# Patient Record
Sex: Female | Born: 1940 | Race: Black or African American | Hispanic: No | Smoking: Never smoker
Health system: Southern US, Community
[De-identification: ages and names within clinical notes are randomized; demographics above are authoritative.]

## PROBLEM LIST (undated history)

## (undated) DIAGNOSIS — M069 Rheumatoid arthritis, unspecified: Secondary | ICD-10-CM

## (undated) DIAGNOSIS — M1990 Unspecified osteoarthritis, unspecified site: Secondary | ICD-10-CM

## (undated) DIAGNOSIS — I10 Essential (primary) hypertension: Secondary | ICD-10-CM

## (undated) DIAGNOSIS — R55 Syncope and collapse: Secondary | ICD-10-CM

## (undated) DIAGNOSIS — E669 Obesity, unspecified: Secondary | ICD-10-CM

## (undated) DIAGNOSIS — I82409 Acute embolism and thrombosis of unspecified deep veins of unspecified lower extremity: Secondary | ICD-10-CM

## (undated) DIAGNOSIS — I2510 Atherosclerotic heart disease of native coronary artery without angina pectoris: Secondary | ICD-10-CM

## (undated) DIAGNOSIS — R12 Heartburn: Secondary | ICD-10-CM

## (undated) DIAGNOSIS — C189 Malignant neoplasm of colon, unspecified: Secondary | ICD-10-CM

## (undated) HISTORY — PX: COLECTOMY: SHX59

## (undated) HISTORY — DX: Malignant neoplasm of colon, unspecified: C18.9

## (undated) HISTORY — DX: Unspecified osteoarthritis, unspecified site: M19.90

## (undated) HISTORY — DX: Acute embolism and thrombosis of unspecified deep veins of unspecified lower extremity: I82.409

## (undated) HISTORY — PX: CHOLECYSTECTOMY: SHX55

## (undated) HISTORY — DX: Rheumatoid arthritis, unspecified: M06.9

## (undated) HISTORY — DX: Heartburn: R12

## (undated) HISTORY — PX: APPENDECTOMY: SHX54

## (undated) HISTORY — PX: OTHER SURGICAL HISTORY: SHX169

## (undated) HISTORY — DX: Syncope and collapse: R55

## (undated) HISTORY — DX: Obesity, unspecified: E66.9

## (undated) HISTORY — PX: ABDOMINAL HYSTERECTOMY: SHX81

## (undated) HISTORY — PX: ROTATOR CUFF REPAIR: SHX139

## (undated) HISTORY — DX: Essential (primary) hypertension: I10

## (undated) HISTORY — DX: Atherosclerotic heart disease of native coronary artery without angina pectoris: I25.10

---

## 1997-09-17 ENCOUNTER — Ambulatory Visit (HOSPITAL_COMMUNITY): Admission: RE | Admit: 1997-09-17 | Discharge: 1997-09-17 | Payer: Self-pay | Admitting: Cardiology

## 2000-03-10 DIAGNOSIS — C189 Malignant neoplasm of colon, unspecified: Secondary | ICD-10-CM

## 2000-03-10 HISTORY — DX: Malignant neoplasm of colon, unspecified: C18.9

## 2000-08-15 ENCOUNTER — Encounter (HOSPITAL_COMMUNITY): Admission: RE | Admit: 2000-08-15 | Discharge: 2000-09-14 | Payer: Self-pay | Admitting: Oncology

## 2000-08-15 ENCOUNTER — Encounter: Admission: RE | Admit: 2000-08-15 | Discharge: 2000-08-15 | Payer: Self-pay | Admitting: Oncology

## 2000-08-17 ENCOUNTER — Encounter: Payer: Self-pay | Admitting: Emergency Medicine

## 2000-08-18 ENCOUNTER — Inpatient Hospital Stay (HOSPITAL_COMMUNITY): Admission: AD | Admit: 2000-08-18 | Discharge: 2000-08-22 | Payer: Self-pay | Admitting: *Deleted

## 2000-09-06 ENCOUNTER — Inpatient Hospital Stay (HOSPITAL_COMMUNITY): Admission: EM | Admit: 2000-09-06 | Discharge: 2000-09-10 | Payer: Self-pay | Admitting: Emergency Medicine

## 2000-09-06 ENCOUNTER — Encounter: Payer: Self-pay | Admitting: Emergency Medicine

## 2000-09-07 ENCOUNTER — Encounter: Payer: Self-pay | Admitting: Family Medicine

## 2000-09-16 ENCOUNTER — Encounter: Admission: RE | Admit: 2000-09-16 | Discharge: 2000-09-16 | Payer: Self-pay | Admitting: Oncology

## 2000-09-16 ENCOUNTER — Encounter (HOSPITAL_COMMUNITY): Admission: RE | Admit: 2000-09-16 | Discharge: 2000-10-16 | Payer: Self-pay | Admitting: Oncology

## 2000-10-18 ENCOUNTER — Encounter (HOSPITAL_COMMUNITY): Admission: RE | Admit: 2000-10-18 | Discharge: 2000-11-08 | Payer: Self-pay | Admitting: Oncology

## 2000-10-18 ENCOUNTER — Encounter: Admission: RE | Admit: 2000-10-18 | Discharge: 2000-10-18 | Payer: Self-pay | Admitting: Oncology

## 2000-10-19 ENCOUNTER — Encounter (HOSPITAL_COMMUNITY): Payer: Self-pay | Admitting: Oncology

## 2000-10-19 ENCOUNTER — Encounter: Payer: Self-pay | Admitting: Family Medicine

## 2000-10-19 ENCOUNTER — Inpatient Hospital Stay (HOSPITAL_COMMUNITY): Admission: AD | Admit: 2000-10-19 | Discharge: 2000-10-27 | Payer: Self-pay | Admitting: Family Medicine

## 2000-10-21 ENCOUNTER — Encounter: Payer: Self-pay | Admitting: Internal Medicine

## 2000-11-08 ENCOUNTER — Encounter (HOSPITAL_COMMUNITY): Admission: RE | Admit: 2000-11-08 | Discharge: 2000-12-08 | Payer: Self-pay | Admitting: Oncology

## 2000-11-08 ENCOUNTER — Encounter: Admission: RE | Admit: 2000-11-08 | Discharge: 2000-11-08 | Payer: Self-pay | Admitting: Oncology

## 2000-12-23 ENCOUNTER — Encounter: Payer: Self-pay | Admitting: Family Medicine

## 2000-12-23 ENCOUNTER — Encounter: Admission: RE | Admit: 2000-12-23 | Discharge: 2000-12-23 | Payer: Self-pay | Admitting: Oncology

## 2000-12-23 ENCOUNTER — Ambulatory Visit (HOSPITAL_COMMUNITY): Admission: RE | Admit: 2000-12-23 | Discharge: 2000-12-23 | Payer: Self-pay | Admitting: Family Medicine

## 2001-02-13 ENCOUNTER — Encounter (HOSPITAL_COMMUNITY): Admission: RE | Admit: 2001-02-13 | Discharge: 2001-03-15 | Payer: Self-pay | Admitting: Oncology

## 2001-02-13 ENCOUNTER — Encounter: Admission: RE | Admit: 2001-02-13 | Discharge: 2001-02-13 | Payer: Self-pay | Admitting: Oncology

## 2001-03-10 ENCOUNTER — Encounter (HOSPITAL_COMMUNITY): Payer: Self-pay | Admitting: Oncology

## 2001-03-20 ENCOUNTER — Encounter: Admission: RE | Admit: 2001-03-20 | Discharge: 2001-03-20 | Payer: Self-pay | Admitting: Oncology

## 2001-03-25 ENCOUNTER — Encounter: Payer: Self-pay | Admitting: Emergency Medicine

## 2001-03-25 ENCOUNTER — Encounter: Payer: Self-pay | Admitting: Family Medicine

## 2001-03-25 ENCOUNTER — Observation Stay (HOSPITAL_COMMUNITY): Admission: EM | Admit: 2001-03-25 | Discharge: 2001-03-27 | Payer: Self-pay | Admitting: Emergency Medicine

## 2001-04-03 ENCOUNTER — Ambulatory Visit (HOSPITAL_COMMUNITY): Admission: RE | Admit: 2001-04-03 | Discharge: 2001-04-03 | Payer: Self-pay | Admitting: Cardiology

## 2001-04-03 ENCOUNTER — Encounter: Payer: Self-pay | Admitting: Cardiology

## 2001-04-27 ENCOUNTER — Encounter (HOSPITAL_COMMUNITY): Admission: RE | Admit: 2001-04-27 | Discharge: 2001-05-27 | Payer: Self-pay | Admitting: Oncology

## 2001-04-27 ENCOUNTER — Encounter: Admission: RE | Admit: 2001-04-27 | Discharge: 2001-04-27 | Payer: Self-pay | Admitting: Oncology

## 2001-05-17 ENCOUNTER — Encounter (HOSPITAL_COMMUNITY): Payer: Self-pay | Admitting: Oncology

## 2001-09-13 ENCOUNTER — Encounter (HOSPITAL_COMMUNITY): Admission: RE | Admit: 2001-09-13 | Discharge: 2001-10-13 | Payer: Self-pay | Admitting: Oncology

## 2001-09-18 ENCOUNTER — Emergency Department (HOSPITAL_COMMUNITY): Admission: EM | Admit: 2001-09-18 | Discharge: 2001-09-19 | Payer: Self-pay | Admitting: Emergency Medicine

## 2001-09-19 ENCOUNTER — Encounter: Payer: Self-pay | Admitting: Emergency Medicine

## 2001-09-27 ENCOUNTER — Ambulatory Visit (HOSPITAL_COMMUNITY): Admission: RE | Admit: 2001-09-27 | Discharge: 2001-09-27 | Payer: Self-pay | Admitting: General Surgery

## 2001-11-03 ENCOUNTER — Ambulatory Visit (HOSPITAL_COMMUNITY): Admission: RE | Admit: 2001-11-03 | Discharge: 2001-11-03 | Payer: Self-pay | Admitting: Orthopedic Surgery

## 2001-11-03 ENCOUNTER — Encounter: Payer: Self-pay | Admitting: Orthopedic Surgery

## 2002-01-17 ENCOUNTER — Encounter: Admission: RE | Admit: 2002-01-17 | Discharge: 2002-01-17 | Payer: Self-pay | Admitting: Oncology

## 2002-01-17 ENCOUNTER — Encounter (HOSPITAL_COMMUNITY): Admission: RE | Admit: 2002-01-17 | Discharge: 2002-02-16 | Payer: Self-pay | Admitting: Oncology

## 2002-05-24 ENCOUNTER — Encounter: Payer: Self-pay | Admitting: Family Medicine

## 2002-05-24 ENCOUNTER — Ambulatory Visit (HOSPITAL_COMMUNITY): Admission: RE | Admit: 2002-05-24 | Discharge: 2002-05-24 | Payer: Self-pay | Admitting: Family Medicine

## 2002-06-19 ENCOUNTER — Encounter: Admission: RE | Admit: 2002-06-19 | Discharge: 2002-06-19 | Payer: Self-pay | Admitting: Oncology

## 2002-06-19 ENCOUNTER — Encounter (HOSPITAL_COMMUNITY): Admission: RE | Admit: 2002-06-19 | Discharge: 2002-07-19 | Payer: Self-pay | Admitting: Oncology

## 2002-08-23 ENCOUNTER — Encounter (HOSPITAL_COMMUNITY): Admission: RE | Admit: 2002-08-23 | Discharge: 2002-09-22 | Payer: Self-pay | Admitting: Rheumatology

## 2002-08-23 ENCOUNTER — Encounter: Payer: Self-pay | Admitting: Rheumatology

## 2002-10-04 ENCOUNTER — Encounter (HOSPITAL_COMMUNITY): Admission: RE | Admit: 2002-10-04 | Discharge: 2002-11-03 | Payer: Self-pay | Admitting: Rheumatology

## 2002-11-04 ENCOUNTER — Emergency Department (HOSPITAL_COMMUNITY): Admission: EM | Admit: 2002-11-04 | Discharge: 2002-11-04 | Payer: Self-pay | Admitting: Emergency Medicine

## 2002-11-29 ENCOUNTER — Encounter (HOSPITAL_COMMUNITY): Admission: RE | Admit: 2002-11-29 | Discharge: 2002-12-29 | Payer: Self-pay | Admitting: Specialist

## 2003-01-08 ENCOUNTER — Encounter (HOSPITAL_COMMUNITY): Admission: RE | Admit: 2003-01-08 | Discharge: 2003-02-07 | Payer: Self-pay | Admitting: Rheumatology

## 2003-01-08 ENCOUNTER — Encounter: Admission: RE | Admit: 2003-01-08 | Discharge: 2003-01-08 | Payer: Self-pay | Admitting: Oncology

## 2003-01-10 ENCOUNTER — Encounter (HOSPITAL_COMMUNITY): Admission: RE | Admit: 2003-01-10 | Discharge: 2003-02-07 | Payer: Self-pay | Admitting: Rheumatology

## 2003-03-29 ENCOUNTER — Encounter: Admission: RE | Admit: 2003-03-29 | Discharge: 2003-03-29 | Payer: Self-pay | Admitting: Oncology

## 2003-04-25 ENCOUNTER — Ambulatory Visit (HOSPITAL_COMMUNITY): Admission: RE | Admit: 2003-04-25 | Discharge: 2003-04-25 | Payer: Self-pay | Admitting: Family Medicine

## 2003-05-30 ENCOUNTER — Ambulatory Visit (HOSPITAL_COMMUNITY): Admission: RE | Admit: 2003-05-30 | Discharge: 2003-05-30 | Payer: Self-pay | Admitting: Family Medicine

## 2003-06-28 ENCOUNTER — Encounter: Admission: RE | Admit: 2003-06-28 | Discharge: 2003-06-28 | Payer: Self-pay | Admitting: Oncology

## 2003-10-02 ENCOUNTER — Emergency Department (HOSPITAL_COMMUNITY): Admission: EM | Admit: 2003-10-02 | Discharge: 2003-10-03 | Payer: Self-pay | Admitting: *Deleted

## 2003-10-10 ENCOUNTER — Encounter (HOSPITAL_COMMUNITY): Admission: RE | Admit: 2003-10-10 | Discharge: 2003-11-09 | Payer: Self-pay | Admitting: Oncology

## 2003-10-10 ENCOUNTER — Encounter: Admission: RE | Admit: 2003-10-10 | Discharge: 2003-10-10 | Payer: Self-pay | Admitting: Cardiology

## 2003-10-10 ENCOUNTER — Encounter: Admission: RE | Admit: 2003-10-10 | Discharge: 2003-10-10 | Payer: Self-pay | Admitting: Oncology

## 2003-12-30 ENCOUNTER — Encounter: Admission: RE | Admit: 2003-12-30 | Discharge: 2003-12-30 | Payer: Self-pay | Admitting: Oncology

## 2003-12-30 ENCOUNTER — Encounter (HOSPITAL_COMMUNITY): Admission: RE | Admit: 2003-12-30 | Discharge: 2004-01-29 | Payer: Self-pay | Admitting: Oncology

## 2004-04-06 ENCOUNTER — Ambulatory Visit (HOSPITAL_COMMUNITY): Payer: Self-pay | Admitting: Oncology

## 2004-04-06 ENCOUNTER — Encounter: Admission: RE | Admit: 2004-04-06 | Discharge: 2004-04-06 | Payer: Self-pay | Admitting: Oncology

## 2004-04-06 ENCOUNTER — Encounter (HOSPITAL_COMMUNITY): Admission: RE | Admit: 2004-04-06 | Discharge: 2004-05-06 | Payer: Self-pay | Admitting: Oncology

## 2004-04-20 ENCOUNTER — Other Ambulatory Visit: Admission: RE | Admit: 2004-04-20 | Discharge: 2004-04-20 | Payer: Self-pay | Admitting: Obstetrics and Gynecology

## 2004-06-03 ENCOUNTER — Ambulatory Visit (HOSPITAL_COMMUNITY): Admission: RE | Admit: 2004-06-03 | Discharge: 2004-06-03 | Payer: Self-pay | Admitting: Family Medicine

## 2004-07-15 ENCOUNTER — Encounter (HOSPITAL_COMMUNITY): Admission: RE | Admit: 2004-07-15 | Discharge: 2004-08-14 | Payer: Self-pay | Admitting: Oncology

## 2004-07-15 ENCOUNTER — Ambulatory Visit (HOSPITAL_COMMUNITY): Payer: Self-pay | Admitting: Oncology

## 2004-07-15 ENCOUNTER — Encounter: Admission: RE | Admit: 2004-07-15 | Discharge: 2004-07-15 | Payer: Self-pay | Admitting: Oncology

## 2004-08-26 ENCOUNTER — Encounter: Admission: RE | Admit: 2004-08-26 | Discharge: 2004-08-26 | Payer: Self-pay | Admitting: Oncology

## 2004-08-26 ENCOUNTER — Encounter (HOSPITAL_COMMUNITY): Admission: RE | Admit: 2004-08-26 | Discharge: 2004-09-25 | Payer: Self-pay | Admitting: Oncology

## 2004-10-13 ENCOUNTER — Ambulatory Visit (HOSPITAL_COMMUNITY): Admission: RE | Admit: 2004-10-13 | Discharge: 2004-10-13 | Payer: Self-pay | Admitting: Family Medicine

## 2004-10-15 ENCOUNTER — Ambulatory Visit (HOSPITAL_COMMUNITY): Admission: RE | Admit: 2004-10-15 | Discharge: 2004-10-15 | Payer: Self-pay | Admitting: General Surgery

## 2004-11-18 ENCOUNTER — Encounter (HOSPITAL_COMMUNITY): Admission: RE | Admit: 2004-11-18 | Discharge: 2004-12-18 | Payer: Self-pay | Admitting: Oncology

## 2004-11-18 ENCOUNTER — Ambulatory Visit (HOSPITAL_COMMUNITY): Payer: Self-pay | Admitting: Oncology

## 2004-11-18 ENCOUNTER — Encounter: Admission: RE | Admit: 2004-11-18 | Discharge: 2004-11-18 | Payer: Self-pay | Admitting: Oncology

## 2004-12-31 ENCOUNTER — Encounter: Admission: RE | Admit: 2004-12-31 | Discharge: 2004-12-31 | Payer: Self-pay | Admitting: Oncology

## 2004-12-31 ENCOUNTER — Encounter (HOSPITAL_COMMUNITY): Admission: RE | Admit: 2004-12-31 | Discharge: 2005-01-30 | Payer: Self-pay | Admitting: Oncology

## 2005-01-15 ENCOUNTER — Ambulatory Visit (HOSPITAL_COMMUNITY): Payer: Self-pay | Admitting: Oncology

## 2005-04-14 ENCOUNTER — Ambulatory Visit (HOSPITAL_COMMUNITY): Payer: Self-pay | Admitting: Oncology

## 2005-04-14 ENCOUNTER — Encounter (HOSPITAL_COMMUNITY): Admission: RE | Admit: 2005-04-14 | Discharge: 2005-04-14 | Payer: Self-pay | Admitting: Oncology

## 2005-04-14 ENCOUNTER — Encounter: Admission: RE | Admit: 2005-04-14 | Discharge: 2005-04-14 | Payer: Self-pay | Admitting: Oncology

## 2005-07-12 ENCOUNTER — Encounter (HOSPITAL_COMMUNITY): Admission: RE | Admit: 2005-07-12 | Discharge: 2005-08-11 | Payer: Self-pay | Admitting: Oncology

## 2005-07-12 ENCOUNTER — Ambulatory Visit (HOSPITAL_COMMUNITY): Payer: Self-pay | Admitting: Oncology

## 2005-07-12 ENCOUNTER — Encounter: Admission: RE | Admit: 2005-07-12 | Discharge: 2005-07-12 | Payer: Self-pay | Admitting: Oncology

## 2005-07-28 ENCOUNTER — Ambulatory Visit (HOSPITAL_COMMUNITY): Admission: RE | Admit: 2005-07-28 | Discharge: 2005-07-28 | Payer: Self-pay | Admitting: Family Medicine

## 2005-08-01 ENCOUNTER — Emergency Department (HOSPITAL_COMMUNITY): Admission: EM | Admit: 2005-08-01 | Discharge: 2005-08-01 | Payer: Self-pay | Admitting: Emergency Medicine

## 2005-10-01 ENCOUNTER — Encounter: Admission: RE | Admit: 2005-10-01 | Discharge: 2005-10-01 | Payer: Self-pay | Admitting: Oncology

## 2005-10-01 ENCOUNTER — Encounter (HOSPITAL_COMMUNITY): Admission: RE | Admit: 2005-10-01 | Discharge: 2005-10-31 | Payer: Self-pay | Admitting: Oncology

## 2005-10-01 ENCOUNTER — Ambulatory Visit (HOSPITAL_COMMUNITY): Payer: Self-pay | Admitting: Oncology

## 2005-10-15 ENCOUNTER — Ambulatory Visit (HOSPITAL_COMMUNITY): Admission: RE | Admit: 2005-10-15 | Discharge: 2005-10-15 | Payer: Self-pay | Admitting: Family Medicine

## 2005-11-01 ENCOUNTER — Encounter: Admission: RE | Admit: 2005-11-01 | Discharge: 2005-11-01 | Payer: Self-pay | Admitting: Oncology

## 2005-11-01 ENCOUNTER — Encounter (HOSPITAL_COMMUNITY): Admission: RE | Admit: 2005-11-01 | Discharge: 2005-12-01 | Payer: Self-pay | Admitting: Oncology

## 2006-01-17 ENCOUNTER — Encounter: Admission: RE | Admit: 2006-01-17 | Discharge: 2006-02-04 | Payer: Self-pay | Admitting: Oncology

## 2006-01-17 ENCOUNTER — Ambulatory Visit (HOSPITAL_COMMUNITY): Payer: Self-pay | Admitting: Oncology

## 2006-01-17 ENCOUNTER — Encounter (HOSPITAL_COMMUNITY): Admission: RE | Admit: 2006-01-17 | Discharge: 2006-02-04 | Payer: Self-pay | Admitting: Oncology

## 2006-04-13 ENCOUNTER — Encounter: Admission: RE | Admit: 2006-04-13 | Discharge: 2006-04-13 | Payer: Self-pay

## 2006-04-19 ENCOUNTER — Encounter: Admission: RE | Admit: 2006-04-19 | Discharge: 2006-04-19 | Payer: Self-pay

## 2006-05-06 ENCOUNTER — Encounter: Admission: RE | Admit: 2006-05-06 | Discharge: 2006-05-06 | Payer: Self-pay

## 2006-05-27 ENCOUNTER — Encounter: Admission: RE | Admit: 2006-05-27 | Discharge: 2006-05-27 | Payer: Self-pay

## 2006-07-18 ENCOUNTER — Encounter (HOSPITAL_COMMUNITY): Admission: RE | Admit: 2006-07-18 | Discharge: 2006-08-17 | Payer: Self-pay | Admitting: Oncology

## 2006-07-18 ENCOUNTER — Ambulatory Visit (HOSPITAL_COMMUNITY): Payer: Self-pay | Admitting: Oncology

## 2006-08-16 ENCOUNTER — Ambulatory Visit (HOSPITAL_BASED_OUTPATIENT_CLINIC_OR_DEPARTMENT_OTHER): Admission: RE | Admit: 2006-08-16 | Discharge: 2006-08-16 | Payer: Self-pay | Admitting: Orthopedic Surgery

## 2006-09-22 ENCOUNTER — Observation Stay (HOSPITAL_COMMUNITY): Admission: EM | Admit: 2006-09-22 | Discharge: 2006-09-23 | Payer: Self-pay | Admitting: Emergency Medicine

## 2006-10-25 ENCOUNTER — Ambulatory Visit (HOSPITAL_COMMUNITY): Admission: RE | Admit: 2006-10-25 | Discharge: 2006-10-25 | Payer: Self-pay | Admitting: Family Medicine

## 2006-11-21 ENCOUNTER — Ambulatory Visit (HOSPITAL_COMMUNITY): Payer: Self-pay | Admitting: Oncology

## 2006-11-21 ENCOUNTER — Encounter (HOSPITAL_COMMUNITY): Admission: RE | Admit: 2006-11-21 | Discharge: 2006-12-21 | Payer: Self-pay | Admitting: Oncology

## 2007-01-05 ENCOUNTER — Encounter: Admission: RE | Admit: 2007-01-05 | Discharge: 2007-01-05 | Payer: Self-pay | Admitting: Cardiology

## 2007-01-16 ENCOUNTER — Encounter (HOSPITAL_COMMUNITY): Admission: RE | Admit: 2007-01-16 | Discharge: 2007-02-07 | Payer: Self-pay | Admitting: Oncology

## 2007-01-17 ENCOUNTER — Ambulatory Visit (HOSPITAL_COMMUNITY): Payer: Self-pay | Admitting: Oncology

## 2007-03-29 ENCOUNTER — Encounter: Admission: RE | Admit: 2007-03-29 | Discharge: 2007-03-29 | Payer: Self-pay

## 2007-07-14 ENCOUNTER — Encounter (HOSPITAL_COMMUNITY): Admission: RE | Admit: 2007-07-14 | Discharge: 2007-08-13 | Payer: Self-pay | Admitting: Oncology

## 2007-07-14 ENCOUNTER — Ambulatory Visit (HOSPITAL_COMMUNITY): Payer: Self-pay | Admitting: Oncology

## 2007-08-10 ENCOUNTER — Encounter: Admission: RE | Admit: 2007-08-10 | Discharge: 2007-08-10 | Payer: Self-pay

## 2007-09-13 ENCOUNTER — Encounter (HOSPITAL_COMMUNITY): Admission: RE | Admit: 2007-09-13 | Discharge: 2007-10-13 | Payer: Self-pay | Admitting: Oncology

## 2007-09-15 ENCOUNTER — Ambulatory Visit (HOSPITAL_COMMUNITY): Payer: Self-pay | Admitting: Oncology

## 2007-11-15 ENCOUNTER — Ambulatory Visit (HOSPITAL_COMMUNITY): Admission: RE | Admit: 2007-11-15 | Discharge: 2007-11-15 | Payer: Self-pay | Admitting: Family Medicine

## 2007-12-26 ENCOUNTER — Emergency Department (HOSPITAL_COMMUNITY): Admission: EM | Admit: 2007-12-26 | Discharge: 2007-12-27 | Payer: Self-pay | Admitting: Emergency Medicine

## 2008-01-11 ENCOUNTER — Encounter: Admission: RE | Admit: 2008-01-11 | Discharge: 2008-01-11 | Payer: Self-pay

## 2008-02-05 ENCOUNTER — Encounter (HOSPITAL_COMMUNITY): Admission: RE | Admit: 2008-02-05 | Discharge: 2008-03-06 | Payer: Self-pay | Admitting: Oncology

## 2008-02-12 ENCOUNTER — Ambulatory Visit (HOSPITAL_COMMUNITY): Payer: Self-pay | Admitting: Oncology

## 2008-07-12 ENCOUNTER — Ambulatory Visit (HOSPITAL_COMMUNITY): Payer: Self-pay | Admitting: Oncology

## 2008-07-12 ENCOUNTER — Encounter (HOSPITAL_COMMUNITY): Admission: RE | Admit: 2008-07-12 | Discharge: 2008-08-11 | Payer: Self-pay | Admitting: Oncology

## 2008-08-14 ENCOUNTER — Encounter: Admission: RE | Admit: 2008-08-14 | Discharge: 2008-08-14 | Payer: Self-pay

## 2008-08-19 ENCOUNTER — Encounter: Admission: RE | Admit: 2008-08-19 | Discharge: 2008-08-19 | Payer: Self-pay | Admitting: Neurosurgery

## 2008-11-13 ENCOUNTER — Encounter: Admission: RE | Admit: 2008-11-13 | Discharge: 2008-11-13 | Payer: Self-pay

## 2009-01-09 ENCOUNTER — Encounter: Admission: RE | Admit: 2009-01-09 | Discharge: 2009-01-09 | Payer: Self-pay | Admitting: Neurosurgery

## 2009-01-21 ENCOUNTER — Encounter (HOSPITAL_COMMUNITY): Admission: RE | Admit: 2009-01-21 | Discharge: 2009-02-05 | Payer: Self-pay | Admitting: Oncology

## 2009-01-21 ENCOUNTER — Ambulatory Visit (HOSPITAL_COMMUNITY): Payer: Self-pay | Admitting: Oncology

## 2009-01-24 ENCOUNTER — Ambulatory Visit (HOSPITAL_COMMUNITY): Admission: RE | Admit: 2009-01-24 | Discharge: 2009-01-24 | Payer: Self-pay | Admitting: Family Medicine

## 2009-06-08 ENCOUNTER — Emergency Department (HOSPITAL_COMMUNITY): Admission: EM | Admit: 2009-06-08 | Discharge: 2009-06-08 | Payer: Self-pay | Admitting: Emergency Medicine

## 2009-07-14 ENCOUNTER — Encounter (HOSPITAL_COMMUNITY): Admission: RE | Admit: 2009-07-14 | Discharge: 2009-08-13 | Payer: Self-pay | Admitting: Oncology

## 2009-07-14 ENCOUNTER — Ambulatory Visit (HOSPITAL_COMMUNITY): Payer: Self-pay | Admitting: Oncology

## 2009-08-27 ENCOUNTER — Encounter: Admission: RE | Admit: 2009-08-27 | Discharge: 2009-08-27 | Payer: Self-pay | Admitting: Internal Medicine

## 2009-09-24 ENCOUNTER — Ambulatory Visit (HOSPITAL_COMMUNITY): Admission: RE | Admit: 2009-09-24 | Discharge: 2009-09-24 | Payer: Self-pay | Admitting: Family Medicine

## 2010-02-02 ENCOUNTER — Ambulatory Visit (HOSPITAL_COMMUNITY)
Admission: RE | Admit: 2010-02-02 | Discharge: 2010-02-02 | Payer: Self-pay | Source: Home / Self Care | Admitting: Family Medicine

## 2010-05-13 ENCOUNTER — Encounter (HOSPITAL_COMMUNITY)
Admission: RE | Admit: 2010-05-13 | Discharge: 2010-06-09 | Payer: Self-pay | Source: Home / Self Care | Attending: Family Medicine | Admitting: Family Medicine

## 2010-05-31 ENCOUNTER — Encounter: Payer: Self-pay | Admitting: *Deleted

## 2010-06-01 ENCOUNTER — Encounter: Payer: Self-pay | Admitting: Family Medicine

## 2010-06-11 ENCOUNTER — Encounter (HOSPITAL_COMMUNITY)
Admission: RE | Admit: 2010-06-11 | Discharge: 2010-06-11 | Disposition: A | Discharge status: Home / Self Care | Payer: BC Managed Care – PPO | Source: Ambulatory Visit | Attending: Family Medicine | Admitting: Family Medicine

## 2010-06-11 DIAGNOSIS — M25549 Pain in joints of unspecified hand: Secondary | ICD-10-CM | POA: Insufficient documentation

## 2010-06-11 DIAGNOSIS — IMO0001 Reserved for inherently not codable concepts without codable children: Secondary | ICD-10-CM | POA: Insufficient documentation

## 2010-06-11 DIAGNOSIS — M19049 Primary osteoarthritis, unspecified hand: Secondary | ICD-10-CM | POA: Insufficient documentation

## 2010-07-21 ENCOUNTER — Ambulatory Visit (HOSPITAL_COMMUNITY): Payer: BC Managed Care – PPO | Admitting: Oncology

## 2010-07-21 ENCOUNTER — Encounter (HOSPITAL_COMMUNITY): Payer: Medicare Other | Attending: Oncology

## 2010-07-21 DIAGNOSIS — M25549 Pain in joints of unspecified hand: Secondary | ICD-10-CM | POA: Insufficient documentation

## 2010-07-21 DIAGNOSIS — C189 Malignant neoplasm of colon, unspecified: Secondary | ICD-10-CM

## 2010-07-21 DIAGNOSIS — IMO0001 Reserved for inherently not codable concepts without codable children: Secondary | ICD-10-CM | POA: Insufficient documentation

## 2010-07-21 DIAGNOSIS — M19049 Primary osteoarthritis, unspecified hand: Secondary | ICD-10-CM | POA: Insufficient documentation

## 2010-07-21 LAB — COMPREHENSIVE METABOLIC PANEL
Albumin: 3.6 g/dL (ref 3.5–5.2)
Alkaline Phosphatase: 82 U/L (ref 39–117)
BUN: 14 mg/dL (ref 6–23)
Calcium: 9.6 mg/dL (ref 8.4–10.5)
Creatinine, Ser: 0.79 mg/dL (ref 0.4–1.2)
GFR calc non Af Amer: >60 mL/min (ref >60)
Total Bilirubin: 1.2 mg/dL (ref 0.3–1.2)
Total Protein: 7.2 g/dL (ref 6.0–8.3)

## 2010-07-21 LAB — CBC
MCH: 26.8 pg (ref 26.0–34.0)
MCHC: 34.2 g/dL (ref 30.0–36.0)
MCV: 78.4 fL (ref 78.0–100.0)
Platelets: 261 10*3/uL (ref 150–400)
RDW: 14.2 % (ref 11.5–15.5)
WBC: 15.6 10*3/uL — ABNORMAL HIGH (ref 4.0–10.5)

## 2010-07-21 LAB — DIFFERENTIAL
Eosinophils Absolute: 0.1 10*3/uL (ref 0.0–0.7)
Eosinophils Relative: 1 % (ref 0–5)
Lymphs Abs: 1.7 10*3/uL (ref 0.7–4.0)

## 2010-07-22 LAB — CEA: CEA: 1 ng/mL (ref 0.0–5.0)

## 2010-07-31 LAB — DIFFERENTIAL
Eosinophils Relative: 0 % (ref 0–5)
Lymphocytes Relative: 10 % — ABNORMAL LOW (ref 12–46)
Lymphs Abs: 0.9 10*3/uL (ref 0.7–4.0)
Monocytes Relative: 3 % (ref 3–12)

## 2010-07-31 LAB — COMPREHENSIVE METABOLIC PANEL
AST: 15 U/L (ref 0–37)
CO2: 28 mEq/L (ref 19–32)
Calcium: 8.8 mg/dL (ref 8.4–10.5)
Creatinine, Ser: 0.73 mg/dL (ref 0.4–1.2)
GFR calc Af Amer: >60 mL/min (ref >60)
GFR calc non Af Amer: >60 mL/min (ref >60)
Total Protein: 6.5 g/dL (ref 6.0–8.3)

## 2010-07-31 LAB — CBC
Hemoglobin: 11.9 g/dL — ABNORMAL LOW (ref 12.0–15.0)
MCHC: 34.7 g/dL (ref 30.0–36.0)
Platelets: 241 10*3/uL (ref 150–400)
RDW: 14.6 % (ref 11.5–15.5)

## 2010-07-31 LAB — CEA: CEA: 0.8 ng/mL (ref 0.0–5.0)

## 2010-08-14 LAB — CEA: CEA: 1.1 ng/mL (ref 0.0–5.0)

## 2010-08-20 LAB — COMPREHENSIVE METABOLIC PANEL
ALT: 28 U/L (ref 0–35)
Alkaline Phosphatase: 96 U/L (ref 39–117)
CO2: 26 mEq/L (ref 19–32)
GFR calc non Af Amer: >60 mL/min (ref >60)
Glucose, Bld: 114 mg/dL — ABNORMAL HIGH (ref 70–99)
Potassium: 3.5 mEq/L (ref 3.5–5.1)
Sodium: 139 mEq/L (ref 135–145)

## 2010-08-20 LAB — CBC
HCT: 36.4 % (ref 36.0–46.0)
Hemoglobin: 12.3 g/dL (ref 12.0–15.0)
RDW: 13.9 % (ref 11.5–15.5)
WBC: 8.1 10*3/uL (ref 4.0–10.5)

## 2010-09-15 ENCOUNTER — Encounter (HOSPITAL_COMMUNITY): Payer: Medicare Other

## 2010-09-15 ENCOUNTER — Other Ambulatory Visit (HOSPITAL_COMMUNITY): Payer: Self-pay | Admitting: Oncology

## 2010-09-15 ENCOUNTER — Encounter (HOSPITAL_COMMUNITY): Payer: Medicare Other | Attending: Oncology

## 2010-09-15 DIAGNOSIS — Z09 Encounter for follow-up examination after completed treatment for conditions other than malignant neoplasm: Secondary | ICD-10-CM | POA: Insufficient documentation

## 2010-09-15 DIAGNOSIS — C189 Malignant neoplasm of colon, unspecified: Secondary | ICD-10-CM

## 2010-09-15 DIAGNOSIS — E119 Type 2 diabetes mellitus without complications: Secondary | ICD-10-CM | POA: Insufficient documentation

## 2010-09-15 DIAGNOSIS — Z85038 Personal history of other malignant neoplasm of large intestine: Secondary | ICD-10-CM | POA: Insufficient documentation

## 2010-09-15 LAB — DIFFERENTIAL
Basophils Absolute: 0.1 10*3/uL (ref 0.0–0.1)
Basophils Relative: 1 % (ref 0–1)
Eosinophils Absolute: 0.2 10*3/uL (ref 0.0–0.7)
Neutrophils Relative %: 73 % (ref 43–77)

## 2010-09-15 LAB — POTASSIUM: Potassium: 3.9 mEq/L (ref 3.5–5.1)

## 2010-09-15 LAB — CBC
MCH: 26.8 pg (ref 26.0–34.0)
Platelets: 234 10*3/uL (ref 150–400)
RBC: 4.81 MIL/uL (ref 3.87–5.11)
WBC: 9.2 10*3/uL (ref 4.0–10.5)

## 2010-09-22 NOTE — H&P (Signed)
Melissa Reynolds, PAGEL NO.:  [id number]   MEDICAL RECORD NO.:  [id number]          PATIENT TYPE:  INP   LOCATION:  A302                          FACILITY:  [facility]   PHYSICIAN:  Osvaldo Shipper, MD     DATE OF BIRTH:  08/17/1940   DATE OF ADMISSION:  09/22/2006  DATE OF DISCHARGE:  LH                              HISTORY & PHYSICAL   PRIMARY CARE PHYSICIAN:  Annia Friendly. Loleta Chance, M.D.  She is also followed by  Ladona Horns. Mariel Sleet, M.D.   ADMISSION DIAGNOSES:  1. Right acute deep venous thrombosis in the right lower extremity.  2. History of type 2 diabetes.  3. History of hypertension.  4. History of chemotherapy-induced cardiomyopathy.  5. History of colon cancer in the past.  6. History of rheumatoid arthritis.   CHIEF COMPLAINT:  Right-sided leg pain since yesterday.   HISTORY OF PRESENT ILLNESS:  The patient is a 70 year old African-  American female who was in her usual state of health until about  yesterday morning when she started noticing pain in her right thigh  region.  The pain was at times 10/10 intensity described as a cramping  type of pain with no radiation.  There were no associated symptoms.  Precipitating factor was not identified.  The pain was activated when  she tried to move her right leg.  Relieving factors included pain  medications.  The patient reported surgery to her right shoulder on  August 16, 2006.  She states that she does not lead a sedentary lifestyle.  No recent travel outside this area.  No air trips recently.  Otherwise,  the patient feels quite well.  She does have aches and pain associated  with her rheumatoid arthritis, but otherwise is quite healthy.   HOME MEDICATIONS:  I am not sure if this is a complete list, but this is  what we have.  1. Methotrexate 2.5 mg tablet five tablets in the morning and five      tablets in the evening on every Sunday.  2. Prednisone 5 mg every other day.  3. Norvasc 5 mg once a day.  4.  Spironolactone 25 mg once a day.  5. Isosorbide mononitrate at 30 mg once a day.  6. Hyzaar 100 x 12.5 once a day.  7. Alprazolam 0.25 mg as needed.  8. Folic acid 1 mg once a day.  9. Darvocet-N as needed.  10.Octopus Met 15/850 mg daily.  11.She was recently on potassium which was discontinued.   ALLERGIES:  SULFA which causes oral ulcers.   PAST MEDICAL HISTORY:  Previous history of DVT in the same leg about 5  years ago when she was receiving chemotherapy for her colon cancer.  History of colon cancer for which she has had a colectomy and  chemotherapy.  Because of the chemotherapy, she developed  cardiomyopathy.  She has type 2 diabetes on oral hypoglycemic agents.  She has hypertension and rheumatoid arthritis.  She has never had an MI.   PAST SURGICAL HISTORY:  Multiple orthopedic surgeries to knees and  shoulders for various injuries and arthritis.  She also has rheumatoid  arthritis.  She has also had hysterectomy, cholecystectomy.  She has had  some ear surgeries.   SOCIAL HISTORY:  She lives in [place] with her sister.  No smoking  use, no alcohol use, no illicit drug use.  Independent with her daily  activities.  She does not need a cane or walker.   FAMILY HISTORY:  Includes significant cancer in the family including  bone cancer, prostate cancer, history of brain aneurysms, strokes,  coronary artery disease, and CHF.   REVIEW OF SYSTEMS:  GENERAL:  Positive for weakness.  HEENT:  Unremarkable.  GASTROINTESTINAL:  Unremarkable.  GENITOURINARY:  Unremarkable.  RESPIRATORY:  Unremarkable.  CARDIOVASCULAR:  Denied any  chest pain or shortness of breath.  MUSCULOSKELETAL:  As in HPI with  pain from her rheumatoid arthritis.  NEUROLOGY:  Unremarkable.  MUSCULOSKELETAL:  See HPI.  Other systems unremarkable.   PHYSICAL EXAMINATION:  VITAL SIGNS:  Temperature 97.4, blood pressure  147/66, heart rate in the 60's, respiratory rate 18, saturation 100% on  room air.   GENERAL:  Obese female in no distress, quite pleasant to talk to.  HEENT:  There is no pallor and no icterus.  Oral mucosa is moist.  No  oral lesions are noted.  NECK:  Soft and supple with no thyromegaly appreciated.  LUNGS:  Clear to auscultation bilaterally.  No wheezes, rales, or  rhonchi present.  CARDIOVASCULAR:  S1 and S2 is normal and regular, no murmurs  appreciated.  ABDOMEN:  Obese, nontender, and nondistended.  Bowel sounds are present.  No mass or organomegaly is appreciated.  EXTREMITIES:  Right leg shows what appears to be a bruise on the lateral  aspect of the thigh, slightly tender in that area.  She denied any  trauma or falls to that area.  No calf tenderness is present.  There is  good range of motion of the knee.  No significant knee joint swelling is  noted.  Peripheral pulses are palpable.  NEUROLOGY:  The patient is alert and oriented x3.  No focal neurological  deficits are present.   LABORATORY DATA:  Her CBC shows a hemoglobin 11.1 with MCV 79.  Other  parameters are okay.  D-dimer is 0.81.  BMET is unremarkable.  No PT and  PTT has been done so far.   She did have venous Doppler study done which has been read as small deep  venous thrombosis in right lower extremity with segmental areas of  thrombus.  Findings most consistent with area of chronic clot and small  area of self recanalization.   ASSESSMENT:  This is a 70 year old African-American female with medical  problems as stated above who presents with pain in the right leg and is  found to have evidence for thrombus in the right deep veins.  Although  the radiologist did not say this was an acute DVT, her last episode was  5 years ago.  She has had pain in that leg, she has elevated D-dimer and  I think we will treat it as a DVT at this point.  The bruising in the  right thigh is somewhat quizzical at this point.  I will get an x-ray of the right leg and then proceed from there.  If her pain worsens  or the  bruising worsens, we may have to get a CT of that leg done.  Ideally we  would like to get a venogram done, but that is not a possibility in  this  hospital.   PLAN:  1. Possible right deep venous thrombosis.  We will put her on Lovenox      and Coumadin.  We will have her follow up with Dr. Loleta Chance when she is      discharged tomorrow.  We will provide her with DVT and Coumadin      education.  She has been on Coumadin in the past and hence is aware      of the side effects.  Her risk factors include history of cancer.      She is on steroids chronically and those are possible risk factors.  2. Bruising on the right leg.  We will get an x-ray of her femur and      proceed from there.  There is no obvious swelling in the right      thigh compared to the left.  She has not had any trauma recently.      Hence, I do not suspect any kind of bleeding or hematoma in that      area.  3. Diabetes.  Continue her OHA.  We will check her CBG's q.a.c. and      q.h.s.  Put her on a moderate sliding scale and check HBA1C      tomorrow morning.  4. Hypertension.  Continue all of her antihypertensive agents.  5. Rheumatoid arthritis.  Continue weekly methotrexate and every other      day prednisone.  PMD to monitor her closely for osteoporosis.  She      is definitely at risk and she may benefit from a bone densitometry      scan as an outpatient.  6. GI prophylaxis will be provided.  7. She also has mild anemia which I will defer to the PMD to further      evaluate.   The patient is a full code at this point.  Further management decisions  will be based on results of initial testing and patient response to  treatment.   Please note above is preliminary until signed.      Osvaldo Shipper, MD     GK/MEDQ  D:  09/22/2006  T:  09/22/2006  Job:  [id number]   cc:   Annia Friendly. Loleta Chance, MD  Fax: [phone]   Ladona Horns. Mariel Sleet, MD  Fax: [phone]

## 2010-09-22 NOTE — Discharge Summary (Signed)
NAMEYESLI, Melissa NO.:  Reynolds   MEDICAL RECORD NO.:  Reynolds          PATIENT TYPE:  OBV   LOCATION:  A302                          FACILITY:  [facility]   PHYSICIAN:  Osvaldo Shipper, MD     DATE OF BIRTH:  03-06-1941   DATE OF ADMISSION:  09/22/2006  DATE OF DISCHARGE:  05/16/2008LH                               DISCHARGE SUMMARY   PRIMARY CARE PHYSICIAN:  Annia Friendly. Loleta Chance, MD.   ONCOLOGIST:  Ladona Horns. Neijstrom, MD.   DISCHARGE DIAGNOSES:  1. Deep venous thrombosis right lower extremity.  2. History of type 2 diabetes.  3. History of hypertension.  4. History of chemotherapy induced cardiomyopathy.  5. History of colon cancer, status post surgery and chemotherapy in      the past.  6. History of rheumatoid arthritis.   Please review the history and physical dictated yesterday for details  regarding patient's presenting illness.   BRIEF HOSPITAL COURSE:  This is a 70 year old African-American female  who presented with pain in a right lower extremity.  She was found to  have possible thrombus in her deep veins in her right lower extremity;  and was admitted for initiation of DVT treatment.  She was also noted to  have a small bruise on the right thigh.  No obvious swelling was noted.  Today her pain on the thigh is much better.  The bruise is stable in  size.  She has no history of trauma.  X-ray of the right femur in an  area was done yesterday commented not suggest any evidence for any  trauma or any other lesion that was noted.   The other medical issues remained stable.  Her blood sugars were  reasonably controlled.  She did have one episode of low blood sugar of  54, early this morning, which also corrected with oral intake.  Rheumatoid arthritis is also stable.   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  This patient has a history of cancer, she is on chronic  steroids.  She may be a candidate for long-term  Coumadin therapy.  I  will defer these issues to Dr. Loleta Chance.  She may also benefit from a  venogram at some point; but, again, I will defer this issue to Dr. Loleta Chance.  We have initiated treatment with anticoagulation for now.   DISCHARGE MEDICATIONS:  1. Lovenox 100 mg at 6 p.m. today; and then 100 mg b.i.d. at 9 a.m.      and 9 p.m. starting tomorrow.  2. Coumadin 5 mg once daily in the evening.  3. PT/INR to be checked on Monday, May 19, results recall to Dr. Loleta Chance      for further adjustment of anticoagulation.   Otherwise she can resume all of her other home medications as dictated  in the H&P which include:  1. Methotrexate 2.5 mg tablet, five tablets b.i.d. on every Sunday      (that is five tablets in the morning five tablets in the evening      every Sunday).  2. Norvasc 5 mg  a day.  3. Alprazolam 0.25 mg as needed.  4. Spirolactone 25 mg daily.  5. Prednisone 5 mg every other day.  6. Folic acid 1 mg daily.  7. Isosorbide mononitrate 30 mg daily.  8. Hyzaar 100/12.5 once daily.  9. Darvocet as needed.  10.Actos plus met 850/15 one tablet once daily.   DIET:  She can have a modified carbohydrate diet.   PHYSICAL ACTIVITY:  She has been he has to increase her activities  slowly, not to exert herself, monitor her weight, etcetera.   No consultations obtained during this admission.   FOLLOWUP:  With Dr. Loleta Chance in one week, with Dr. Mariel Sleet as before.   TOTAL TIME AT DISCHARGE:  35 minutes.      Osvaldo Shipper, MD  Electronically Signed     GK/MEDQ  D:  09/23/2006  T:  09/23/2006  Job:  [id number]   cc:   Annia Friendly. Loleta Chance, MD  Fax: [phone]   Ladona Horns. Mariel Sleet, MD  Fax: [phone]

## 2010-09-22 NOTE — H&P (Signed)
Melissa Reynolds, CHATTERJEE NO.:  [id number]   MEDICAL RECORD NO.:  [id number]          PATIENT TYPE:  INP   LOCATION:  A302                          FACILITY:  [facility]   PHYSICIAN:  Osvaldo Shipper, MD     DATE OF BIRTH:  1940/12/29   DATE OF ADMISSION:  09/22/2006  DATE OF DISCHARGE:  LH                              HISTORY & PHYSICAL   PRIMARY CARE PHYSICIAN:  Annia Friendly. Loleta Chance, M.D.  She is also followed by  Ladona Horns. Mariel Sleet, M.D.   ADMISSION DIAGNOSES:  1. Right acute deep venous thrombosis in the right lower extremity.  2. History of type 2 diabetes.  3. History of hypertension.  4. History of chemotherapy-induced cardiomyopathy.  5. History of colon cancer in the past.  6. History of rheumatoid arthritis.   CHIEF COMPLAINT:  Right-sided leg pain since yesterday.   HISTORY OF PRESENT ILLNESS:  The patient is a 70 year old African-  American female who was in her usual state of health until about  yesterday morning when she started noticing pain in her right thigh  region.  The pain was at times 10/10 intensity described as a cramping  type of pain with no radiation.  There were no associated symptoms.  Precipitating factor was not identified.  The pain was activated when  she tried to move her right leg.  Relieving factors included pain  medications.  The patient reported surgery to her right shoulder on  August 16, 2006.  She states that she does not lead a sedentary lifestyle.  No recent travel outside this area.  No air trips recently.  Otherwise,  the patient feels quite well.  She does have aches and pain associated  with her rheumatoid arthritis, but otherwise is quite healthy.   HOME MEDICATIONS:  I am not sure if this is a complete list, but this is  what we have.  1. Methotrexate 2.5 mg tablet five tablets in the morning and five      tablets in the evening on every Sunday.  2. Prednisone 5 mg every other day.  3. Norvasc 5 mg once a day.  4.  Spironolactone 25 mg once a day.  5. Isosorbide mononitrate at 30 mg once a day.  6. Hyzaar 100 x 12.5 once a day.  7. Alprazolam 0.25 mg as needed.  8. Folic acid 1 mg once a day.  9. Darvocet-N as needed.  10.Actos plus Met 15/850 mg daily.  11.She was recently on potassium which was discontinued.   ALLERGIES:  SULFA which causes oral ulcers.   PAST MEDICAL HISTORY:  Previous history of DVT in the same leg about 5  years ago when she was receiving chemotherapy for her colon cancer.  History of colon cancer for which she has had a colectomy and  chemotherapy.  Because of the chemotherapy, she developed  cardiomyopathy.  She has type 2 diabetes on oral hypoglycemic agents.  She has hypertension and rheumatoid arthritis.  She has never had an MI.   PAST SURGICAL HISTORY:  Multiple orthopedic surgeries to knees and  shoulders for various injuries and  arthritis.  She also has rheumatoid  arthritis.  She has also had hysterectomy, cholecystectomy.  She has had  some ear surgeries.   SOCIAL HISTORY:  She lives in [place] with her sister.  No smoking  use, no alcohol use, no illicit drug use.  Independent with her daily  activities.  She does not need a cane or walker.   FAMILY HISTORY:  Includes significant cancer in the family including  bone cancer, prostate cancer, history of brain aneurysms, strokes,  coronary artery disease, and CHF.   REVIEW OF SYSTEMS:  GENERAL:  Positive for weakness.  HEENT:  Unremarkable.  GASTROINTESTINAL:  Unremarkable.  GENITOURINARY:  Unremarkable.  RESPIRATORY:  Unremarkable.  CARDIOVASCULAR:  Denied any  chest pain or shortness of breath.  MUSCULOSKELETAL:  As in HPI with  pain from her rheumatoid arthritis.  NEUROLOGY:  Unremarkable.  MUSCULOSKELETAL:  See HPI.  Other systems unremarkable.   PHYSICAL EXAMINATION:  VITAL SIGNS:  Temperature 97.4, blood pressure  147/66, heart rate in the 60's, respiratory rate 18, saturation 100% on  room air.   GENERAL:  Obese female in no distress, quite pleasant to talk to.  HEENT:  There is no pallor and no icterus.  Oral mucosa is moist.  No  oral lesions are noted.  NECK:  Soft and supple with no thyromegaly appreciated.  LUNGS:  Clear to auscultation bilaterally.  No wheezes, rales, or  rhonchi present.  CARDIOVASCULAR:  S1 and S2 is normal and regular, no murmurs  appreciated.  ABDOMEN:  Obese, nontender, and nondistended.  Bowel sounds are present.  No mass or organomegaly is appreciated.  EXTREMITIES:  Right leg shows what appears to be a bruise on the lateral  aspect of the thigh, slightly tender in that area.  She denied any  trauma or falls to that area.  No calf tenderness is present.  There is  good range of motion of the knee.  No significant knee joint swelling is  noted.  Peripheral pulses are palpable.  NEUROLOGY:  The patient is alert and oriented x3.  No focal neurological  deficits are present.   LABORATORY DATA:  Her CBC shows a hemoglobin 11.1 with MCV 79.  Other  parameters are okay.  D-dimer is 0.81.  BMET is unremarkable.  No PT and  PTT has been done so far.   She did have venous Doppler study done which has been read as small deep  venous thrombosis in right lower extremity with segmental areas of  thrombus.  Findings most consistent with area of chronic clot and small  area of self recanalization.   ASSESSMENT:  This is a 70 year old African-American female with medical  problems as stated above who presents with pain in the right leg and is  found to have evidence for thrombus in the right deep veins.  Although  the radiologist did not say this was an acute DVT, her last episode was  5 years ago.  She has had pain in that leg, she has elevated D-dimer and  I think we will treat it as a DVT at this point.  The bruising in the  right thigh is somewhat quizzical at this point.  I will get an x-ray of the right leg and then proceed from there.  If her pain worsens  or the  bruising worsens, we may have to get a CT of that leg done.  Ideally we  would like to get a venogram done, but that is not a  possibility in this  hospital.   PLAN:  1. Possible right deep venous thrombosis.  We will put her on Lovenox      and Coumadin.  We will have her follow up with Dr. Loleta Chance when she is      discharged tomorrow.  We will provide her with DVT and Coumadin      education.  She has been on Coumadin in the past and hence is aware      of the side effects.  Her risk factors include history of cancer.      She is on steroids chronically and those are possible risk factors.  2. Bruising on the right leg.  We will get an x-ray of her femur and      proceed from there.  There is no obvious swelling in the right      thigh compared to the left.  She has not had any trauma recently.      Hence, I do not suspect any kind of bleeding or hematoma in that      area.  3. Diabetes.  Continue her OHA.  We will check her CBG's q.a.c. and      q.h.s.  Put her on a moderate sliding scale and check HBA1C      tomorrow morning.  4. Hypertension.  Continue all of her antihypertensive agents.  5. Rheumatoid arthritis.  Continue weekly methotrexate and every other      day prednisone.  PMD to monitor her closely for osteoporosis.  She      is definitely at risk and she may benefit from a bone densitometry      scan as an outpatient.  6. GI prophylaxis will be provided.  7. She also has mild anemia which I will defer to the PMD to further      evaluate.   The patient is a full code at this point.   Further management decisions will be based on results of initial testing  and patient response to treatment.      Osvaldo Shipper, MD  Electronically Signed     GK/MEDQ  D:  09/22/2006  T:  09/22/2006  Job:  [id number]   cc:   Annia Friendly. Loleta Chance, MD  Fax: [phone]   Ladona Horns. Mariel Sleet, MD  Fax: [phone]

## 2010-09-25 NOTE — Procedures (Signed)
[facility]  Patient:    Melissa Reynolds, Melissa Reynolds Visit Number: [id number] MRN: [id number]          Service Type: OUT Location: RAD Attending Physician:  Cain Sieve Dictated by:   Delton See, P.A. Proc. Date: 04/03/01 Admit Date:  04/03/2001   CC:         Mirna Mires, M.D.  Glenford Peers, M.D.   Stress Test  Ms. Hajduk is a pleasant 70 year old female who was recently at [facility] for evaluation of chest pain.  She does have a previous history of chest pain.  She underwent cardiac catheterization in May 2002 after an abnormal exercise Cardiolite.  The catheterization showed insignificant coronary artery disease, although there was a question of spasm in a right coronary artery.  She had normal LV function, EF approximately 65%.  Other history for this patient is significant for colon CA, history of hypertension, diabetes mellitus, and occasional hypotension.  She is on Coumadin secondary to a history of DVT.  Prior to the study today the patient reports occasional episodes of weakness, but no recent chest pain.  Her baseline EKG showed sinus rhythm, rate 67 beats per minute with poor R-wave progression.  Blood pressure 160/80, target heart rate 134.  The patient exercised a total of four minutes reaching a maximum heart rate of 140.  Her blood pressure was elevated at 198/100.  It was 240/80 in recovery and then later 164/74 as towards the end of recovery.  The patient had no chest pain.  She did feel tired and short of breath.  There were no significant EKG changes other than frequent PVCs.  The final images are pending at time of this dictation. Dictated by:   Delton See, P.A. Attending Physician:  Cain Sieve DD:  04/03/01 TD:  04/03/01 Job: [id number]

## 2010-09-25 NOTE — Op Note (Signed)
Melissa Reynolds, Melissa Reynolds NO.:  [id number]   MEDICAL RECORD NO.:  [id number]          PATIENT TYPE:  AMB   LOCATION:  [facility]                         FACILITY:  [facility]   PHYSICIAN:  Melissa Reynolds, M.D.    DATE OF BIRTH:  1941-01-22   DATE OF PROCEDURE:  08/16/2006  DATE OF DISCHARGE:                               OPERATIVE REPORT   PREOPERATIVE DIAGNOSES:  1. Right shoulder impingement syndrome.  2. Anterior cruciate joint arthritis.  3. Rule out rotator cuff tear retracted.   POSTOPERATIVE DIAGNOSES:  1. Right shoulder impingement and anterior cruciate joint arthritis.  2. Retracted rotator cuff tear.  3. Biceps tendon partial thickness tear, intra-articular attenuated.   PROCEDURE:  1. Right shoulder mini open rotator cuff repair.  2. Arthroscopic acromioplasty and subacromial arch decompression.  3. Arthroscopic distal clavicle resection.  4. Biceps tenotomy   SURGEON:  1. Melissa Reynolds, M.D.   ASSISTANT:  Melissa Reynolds, P.A.   ANESTHESIA:  Scalene block with general endotracheal.   CULTURES:  None.   DRAINS:  None.   ESTIMATED BLOOD LOSS:  Less than 50 mL replaced without pathologic  findings.   HISTORY:  Melissa Reynolds is a 70 year old female referred by Dr. Phylliss Reynolds.  She has  had right shoulder pain.  She had a type 3 acromion on X-rays.  MRI  showed a retracted rotator cuff tear involving the infraspinatus and  posterior supraspinatus tendon.  The anterior supraspinatus tendon  demonstrated marked irregularity, with tendinosis and partial thickness  tearing.  She had a small partial just doing a debridement, a small  partial thickness tear of the upper subscapularis tendon, and AC joint  arthrosis with an attenuated intra-articular biceps tendon; but without  convincing evidence of a full-thickness tear.  At one point she did well  after cortisone injection, but decided to see how she did ultimately;  and then decided to proceed with a right shoulder  arthroscopy (she had  this done on the other side).   At surgery she had significant tearing of the biceps tendon, so much so  that we did a tenotomy.  She had mild glenohumeral disease.  She had  synovitis.  She had a sharp, craggy anterior acromion and she had an  obviously arthritic distal clavicle.  Finally, she had a retracted  rotator cuff tear that we could pull down; and the tendon seems adequate  for repair.  So, we repaired it down to the tuberosity with 3 super  Mitek anchors -- four-pronged; and one additional #2 Ethibond with a  footprint on the tuberosity.  This was through a mini open repair.   PROCEDURE:  With adequate anesthesia obtained using endotracheal  technique after scalene block, the patient was placed in the supine  position.  The right upper extremity was prepped and draped in the  standard fashion.  After standard prepping and draping, skin markings  were made for anatomic positioning.  I then injected 20 mL of 0.5%  Marcaine with epinephrine into the subacromial space to open it up.  I  then entered the shoulder through a posterior portal.  An anterior  portal was established just lateral to the coracoid.  I then probed the  shoulder.  I brought a shaver in and shaved on the biceps tendon,  ultimately releasing it proximally with meniscus scissors.  I then  debrided the superior labrum and smoothed with the ablator.  I did some  synovectomy and reversed portals, and similar shavings were carried out.  I then entered the subacromial space from the posterior portal.  The  anterolateral portal was established and acromioplasty carried out to  the roof of the subacromial space in the manner of Caspari.  This scope  was then turned medially sideways, where through the anterior portal I  debrided AC meniscus and completed a distal clavicle resection 2 shaver  breadths in.  I then entered the shoulder from an anterolateral portal,  and shaved out the bursa --  finding the rotator cuff tear.  I made an  additional anterolateral portal further anterior, and used pituitary  forceps to pull the tendon back into place; and it was a retracted  supraspinatus tear off its footprint.  I then painted with Betadine and  made an incision with a 15 blade from the anterior acromion, down to the  more posterior anterolateral portal, and placed retractors.  Deltoid  splitting incision was made, and I came down upon the tendon -- which I  freshened the edge and then made a footprint with a rongeur to the  cancellous bone.  As a traction stitch, I used a #2 Ethibond, which I  later used for suture.  I then placed three 4-prong Mitek anchors with  #2 Ethibond, and used  horizontal mattress sutures to suture and pull  down the tendon to its footprint and tied them.  I then tied the #2  Ethibond suture through additional tendon laterally.  When I was  satisfied with the repair, irrigation was carried out.  The wound was  then closed with running locking #1 Vicryl on the deltoid fascia; 0 and  2-0 Vicryl on subcutaneous,  and skin with staples.  The portals were left open.  A bulky sterile  compressive dressing was applied with a sling; and the patient, having  tolerated procedure well, was awakened.  She was taken to the recovery  room in satisfactory condition; to be admitted for routine postoperative  care.           ______________________________  Melissa Reynolds, M.D.     Melissa Reynolds  D:  08/16/2006  T:  08/17/2006  Job:  [id number]   cc:   Melissa C. Wall, MD, FACC  [address]  [address]  [place]  [place] [address]   Melissa Friendly. Loleta Chance, MD  Fax: [phone]   Melissa Frieze. Jens Som, MD, [facility]  [address]  [address]  [place]  [place] [address]   Melissa Horns. Mariel Sleet, MD  Fax: [phone]   Melissa Reynolds, M.D.  Fax: [phone]

## 2010-09-25 NOTE — Consult Note (Signed)
NAME:  Melissa Reynolds, Melissa Reynolds NO.:  [id number]   MEDICAL RECORD NO.:  [id number]                   PATIENT TYPE:   LOCATION:                                       FACILITY:   PHYSICIAN:  Aundra Dubin, M.D.            DATE OF BIRTH:  1940/07/16   DATE OF CONSULTATION:  01/10/2003  DATE OF DISCHARGE:                                   CONSULTATION   CHIEF COMPLAINT:  Polyarthritis and diabetes.   HISTORY OF PRESENT ILLNESS:  Melissa Reynolds returns reporting that while on  the prednisone she is feeling significantly better.  The hand pain, soreness  and stiffness with her hands and wrists is significantly better.  Her weight  is stable and is up two pounds.  She says that she has no difficulties with  increased blood sugars.  She is overall feeling a whole better and is more  active.   MEDICATIONS:  1. Norvasc q.d.  2. Cardizem q.d.  3. Avandia.  4. Isordil.  5. Darvocet.  6. Prednisone 10 mg q.d.   PHYSICAL EXAMINATION:  VITAL SIGNS:  Weight 236 pounds, blood pressure  130/70, respirations 18.  GENERAL:  In no distress.  LUNGS:  Clear.  HEART:  Regular with no murmur.  EXTREMITIES:  Lower extremities with no edema.  MUSCULOSKELETAL:  She still has puffiness and swelling to some of the PIPs,  but they are improved.  The MCPs are also much less tender.  The wrists are  nontender at this time.  Elbows, shoulders and knees have a good range of  motion and are nontender.  She has mild MPP tenderness.   ASSESSMENT/PLAN:  Polyarthritis.  This is most likely rheumatoid arthritis.  I have worked with her since May and she has twice responded to the  prednisone and when it was reduced, she only worsens.  I am starting her on  methotrexate 12.5 mg each week and folic acid 1 mg daily.  For the present  time, she will remain on 10 mg of prednisone daily.  If she is feeling  better by the end of September, I have asked her to lower this to 5 mg.  I  have  discussed with her that there are chances of side effects with the  methotrexate.  She does not drink alcohol.  She will be at low risk of  serious liver problems such as cirrhosis.  Will follow liver enzymes on a  regular basis.  Will also follow a capillary blood count for the rare change  of cytopenia.  There is also a chance of being immune suppressed which is  fairly rare, however, she does have diabetes which will increase this risk  slightly.  There is also a chance of pulmonary reaction with fever, cough  and shortness of breath.  There is also a chance of stomatitis, hair  thinning, rashes, malaise and nausea.  I have discussed with  her that the  methotrexate still is the most used medicine for  rheumatoid arthritis in the [place].  It has the best balance of  safety, cost and probable chance of working for her.   She will return to my [place] office in about four to five weeks.                                               Aundra Dubin, M.D.    WWT/MEDQ  D:  01/10/2003  T:  01/10/2003  Job:  [id number]

## 2010-09-25 NOTE — Cardiovascular Report (Signed)
[place]  Patient:    Melissa Reynolds, Melissa Reynolds                    MRN: [id number] Proc. Date: 09/08/00 Adm. Date:  [id number] Attending:  Junious Silk CC:         Ladona Horns. Mariel Sleet, M.D.  Thomas C. Wall, M.D. [facility]  Cardiac Catheterization Laboratory   Cardiac Catheterization  PROCEDURES PERFORMED: 1. Left heart catheterization with coronary angiography and left    ventriculography. 2. Intravascular ultrasound of the proximal right coronary artery.  INDICATIONS:  [name] is a 70 year old woman, who underwent cardiac catheterization two weeks ago for chest pain.  This revealed the presence of a 50% stenosis near the ostium of the right coronary artery which was felt to possibly represent a catheter-induced spasm.  It was recommended she be treated medically.  She represented to [facility] with recurrent chest pain.  She underwent an exercise treadmill test which was notable for development of ST segment elevation in the inferolateral leads during recovery.  Cardiolite images revealed a small perfusion defect in the distal inferolateral and apical walls.  She was referred for repeat cardiac catheterization to rule out obstructive coronary artery disease.  DESCRIPTION OF PROCEDURE:  We initially placed a 6 French sheath in the right femoral artery.  Left heart catheterization was performed with standard Judkins 6 French catheters.  Contrast was Omnipaque.  After review of the images we opted to proceed with intravascular ultrasound of the proximal right coronary artery to rule out fixed obstructive disease.  We placed a 6 French sheath in the right femoral vein as a precaution.  We upsized the sheath in the right femoral artery to a 7 [place].  We used a 7 [place] guiding catheter with side holes, and a Trooper floppy wire.  Intravascular ultrasound was performed with an Atlantis intravascular ultrasound catheter.  Images  were recorded on video tape on manual pullback.  At the conclusion of the case, a guiding catheter was removed, and the patient was transferred to the holding area in good condition.  Of note, she received a total of 3000 units of heparin for an ACT of greater than 200 seconds.  There were no complications.  RESULTS:  HEMODYNAMICS:  Left ventricular pressure 126/10.  Aortic pressure 140/70. There was no aortic valve gradient.  LEFT VENTRICULOGRAM:  Wall motion is normal.  Ejection fraction is estimated at 65%.  There is no mitral regurgitation.  CORONARY ARTERIOGRAPHY:  (Right dominant).  Left main:  Normal.  Left anterior descending:  The left anterior descending artery has a 25% stenosis at its ostium and a 25% stenosis in the proximal vessel.  The LAD gives rise to a single normal sized diagonal branch.  Left circumflex:  The left circumflex gives rise to a small ramus intermediate, large OM-1, small OM-2 and normal sized OM-3.  The left circumflex has only minor luminal irregularities.  Right coronary artery:  The right coronary artery is a relatively large caliber vessel ending as a large posterior descending artery.  In the proximal vessel just at the catheter tip is what appears to be possibly 50% stenosis. It is not clear whether this is catheter-related spasm versus slight distortion of the vessel anatomy by the presence of the catheter.  Several images of this were obtained in different camera angles pulling the catheter back to just the ostium of the artery.  The apparent stenosis was visible in all  different projections; however, did not appear to be flow-limiting.  In the mid right coronary artery is a 20% stenosis.  Intraventricular ultrasound of the right coronary artery revealed the presence of minimal to no atherosclerotic disease in the proximal vessel. Particularly, in the site interior proximal and near ostial right coronary artery, there is a normal vessel  lumen with no atherosclerotic disease.  IMPRESSIONS: 1. Normal left ventricular systolic function. 2. Minimally insignificant coronary artery disease with no evidence of fixed    atherosclerotic disease in the proximal right coronary artery.  The    abnormality seen on angiography represents either a component of    catheter-induced spasm versus distortion of the anatomy by the presence of    the catheter tip.  The patient clinically may have a component of coronary    vasospasm.  RECOMMENDATIONS:  Medical therapy including nitrates and calcium channel blockers for a possible coronary vasospasm. DD:  09/08/00 TD:  09/10/00 Job: [id number]

## 2011-02-01 LAB — COMPREHENSIVE METABOLIC PANEL
ALT: 11
AST: 15
CO2: 27
Calcium: 8.6
GFR calc Af Amer: >60
GFR calc non Af Amer: >60
Sodium: 138

## 2011-02-01 LAB — ANTITHROMBIN III: AntiThromb III Func: 111 (ref 76–126)

## 2011-02-01 LAB — LUPUS ANTICOAGULANT PANEL
DRVVT: 49.3 — ABNORMAL HIGH (ref 36.1–47.0)
Lupus Anticoagulant: NOT DETECTED
PTTLA 4:1 Mix: 44.5 (ref 36.3–48.8)
dRVVT Incubated 1:1 Mix: 40.1 (ref 36.1–47.0)

## 2011-02-01 LAB — CBC
Hemoglobin: 11.9 — ABNORMAL LOW
RBC: 4.33
RDW: 13.8

## 2011-02-01 LAB — BETA-2-GLYCOPROTEIN I ABS, IGG/M/A
Beta-2 Glyco I IgG: <4 U/mL (ref <20)
Beta-2-Glycoprotein I IgA: <7 U/mL (ref <10)
Beta-2-Glycoprotein I IgM: 9 U/mL (ref <10)

## 2011-02-01 LAB — FACTOR 5 LEIDEN

## 2011-02-01 LAB — PROTEIN C ACTIVITY: Protein C Activity: 172 % — ABNORMAL HIGH (ref 75–133)

## 2011-02-03 LAB — POTASSIUM: Potassium: 3.1 — ABNORMAL LOW

## 2011-02-08 LAB — OCCULT BLOOD X 1 CARD TO LAB, STOOL
Fecal Occult Bld: NEGATIVE
Fecal Occult Bld: NEGATIVE
Fecal Occult Bld: NEGATIVE

## 2011-02-19 LAB — CBC
HCT: 35.5 — ABNORMAL LOW
MCHC: 33.9
MCV: 80.8
RBC: 4.39

## 2011-02-19 LAB — CEA: CEA: 1

## 2011-02-23 LAB — BASIC METABOLIC PANEL
BUN: 9
CO2: 30
Chloride: 107
Creatinine, Ser: 0.81
Glucose, Bld: 100 — ABNORMAL HIGH

## 2011-04-05 ENCOUNTER — Other Ambulatory Visit (HOSPITAL_COMMUNITY): Payer: Self-pay | Admitting: Family Medicine

## 2011-04-05 DIAGNOSIS — Z139 Encounter for screening, unspecified: Secondary | ICD-10-CM

## 2011-04-06 ENCOUNTER — Ambulatory Visit (HOSPITAL_COMMUNITY)
Admission: RE | Admit: 2011-04-06 | Discharge: 2011-04-06 | Disposition: A | Discharge status: Home / Self Care | Payer: Medicare Other | Source: Ambulatory Visit | Attending: Family Medicine | Admitting: Family Medicine

## 2011-04-06 DIAGNOSIS — Z139 Encounter for screening, unspecified: Secondary | ICD-10-CM

## 2011-04-06 DIAGNOSIS — Z1231 Encounter for screening mammogram for malignant neoplasm of breast: Secondary | ICD-10-CM | POA: Insufficient documentation

## 2011-05-20 DIAGNOSIS — Z79899 Other long term (current) drug therapy: Secondary | ICD-10-CM | POA: Diagnosis not present

## 2011-05-20 DIAGNOSIS — M545 Low back pain, unspecified: Secondary | ICD-10-CM | POA: Diagnosis not present

## 2011-05-20 DIAGNOSIS — M069 Rheumatoid arthritis, unspecified: Secondary | ICD-10-CM | POA: Diagnosis not present

## 2011-05-20 DIAGNOSIS — M25549 Pain in joints of unspecified hand: Secondary | ICD-10-CM | POA: Diagnosis not present

## 2011-05-20 DIAGNOSIS — M159 Polyosteoarthritis, unspecified: Secondary | ICD-10-CM | POA: Diagnosis not present

## 2011-05-27 DIAGNOSIS — J309 Allergic rhinitis, unspecified: Secondary | ICD-10-CM | POA: Diagnosis not present

## 2011-05-27 DIAGNOSIS — E119 Type 2 diabetes mellitus without complications: Secondary | ICD-10-CM | POA: Diagnosis not present

## 2011-05-27 DIAGNOSIS — I10 Essential (primary) hypertension: Secondary | ICD-10-CM | POA: Diagnosis not present

## 2011-06-11 ENCOUNTER — Other Ambulatory Visit (HOSPITAL_COMMUNITY): Payer: Self-pay

## 2011-06-11 ENCOUNTER — Telehealth (HOSPITAL_COMMUNITY): Payer: Self-pay | Admitting: Oncology

## 2011-06-11 ENCOUNTER — Telehealth (HOSPITAL_COMMUNITY): Payer: Self-pay

## 2011-06-11 DIAGNOSIS — R634 Abnormal weight loss: Secondary | ICD-10-CM

## 2011-06-11 DIAGNOSIS — C189 Malignant neoplasm of colon, unspecified: Secondary | ICD-10-CM

## 2011-06-11 NOTE — Telephone Encounter (Signed)
Message copied by Sterling Big on Fri Jun 11, 2011 11:56 AM ------      Message from: Mariel Sleet, ERIC S      Created: Fri Jun 11, 2011 11:20 AM      Contact: [phone]       Fannie Knee did you get this message??      ----- Message -----         From: Adelene Amas, RN         Sent: 06/10/2011   2:06 PM           To: Evelena Leyden, RN, Randall An, MD            Pt states she has lost 22 lbs and has not been trying. Would like to move up her lab appt from March to sometime soon.      Please have Fannie Knee call her Friday because I won't be here.      Thanks       [employer]

## 2011-06-11 NOTE — Telephone Encounter (Signed)
Message left for patient to call clinic.

## 2011-06-14 ENCOUNTER — Encounter (HOSPITAL_COMMUNITY): Payer: Medicare Other | Attending: Oncology

## 2011-06-14 DIAGNOSIS — Z85038 Personal history of other malignant neoplasm of large intestine: Secondary | ICD-10-CM

## 2011-06-14 DIAGNOSIS — C189 Malignant neoplasm of colon, unspecified: Secondary | ICD-10-CM

## 2011-06-14 DIAGNOSIS — R634 Abnormal weight loss: Secondary | ICD-10-CM | POA: Diagnosis not present

## 2011-06-14 LAB — DIFFERENTIAL
Basophils Absolute: 0.1 10*3/uL (ref 0.0–0.1)
Basophils Relative: 1 % (ref 0–1)
Eosinophils Absolute: 0.3 10*3/uL (ref 0.0–0.7)
Eosinophils Relative: 4 % (ref 0–5)
Monocytes Absolute: 0.5 10*3/uL (ref 0.1–1.0)

## 2011-06-14 LAB — COMPREHENSIVE METABOLIC PANEL
ALT: 13 U/L (ref 0–35)
AST: 14 U/L (ref 0–37)
Calcium: 9.2 mg/dL (ref 8.4–10.5)
Creatinine, Ser: 0.77 mg/dL (ref 0.50–1.10)
Sodium: 144 mEq/L (ref 135–145)
Total Protein: 7.1 g/dL (ref 6.0–8.3)

## 2011-06-14 LAB — CBC
HCT: 37.5 % (ref 36.0–46.0)
MCH: 25 pg — ABNORMAL LOW (ref 26.0–34.0)
MCHC: 33.6 g/dL (ref 30.0–36.0)
MCV: 74.6 fL — ABNORMAL LOW (ref 78.0–100.0)
Platelets: 269 10*3/uL (ref 150–400)
RDW: 14 % (ref 11.5–15.5)
WBC: 7 10*3/uL (ref 4.0–10.5)

## 2011-06-14 LAB — SEDIMENTATION RATE: Sed Rate: 12 mm/hr (ref 0–22)

## 2011-06-14 NOTE — Progress Notes (Signed)
Labs drawn today for cbc,diff,cmp,cea,esr

## 2011-07-16 ENCOUNTER — Other Ambulatory Visit (HOSPITAL_COMMUNITY): Payer: Self-pay

## 2011-07-16 DIAGNOSIS — E876 Hypokalemia: Secondary | ICD-10-CM

## 2011-07-19 ENCOUNTER — Encounter (HOSPITAL_COMMUNITY): Payer: Medicare Other | Attending: Oncology

## 2011-07-19 DIAGNOSIS — C189 Malignant neoplasm of colon, unspecified: Secondary | ICD-10-CM | POA: Insufficient documentation

## 2011-07-19 DIAGNOSIS — E876 Hypokalemia: Secondary | ICD-10-CM

## 2011-07-19 LAB — BASIC METABOLIC PANEL
CO2: 26 mEq/L (ref 19–32)
Calcium: 9.1 mg/dL (ref 8.4–10.5)
GFR calc non Af Amer: 86 mL/min — ABNORMAL LOW (ref >90)
Glucose, Bld: 120 mg/dL — ABNORMAL HIGH (ref 70–99)
Potassium: 3.7 mEq/L (ref 3.5–5.1)
Sodium: 140 mEq/L (ref 135–145)

## 2011-07-19 NOTE — Progress Notes (Signed)
Labs drawn today for bmp 

## 2011-07-20 ENCOUNTER — Encounter (HOSPITAL_BASED_OUTPATIENT_CLINIC_OR_DEPARTMENT_OTHER): Payer: Medicare Other | Admitting: Oncology

## 2011-07-20 ENCOUNTER — Encounter (HOSPITAL_COMMUNITY): Payer: Self-pay | Admitting: Oncology

## 2011-07-20 VITALS — BP 134/72 | HR 76 | Temp 97.4°F | Wt 211.6 lb

## 2011-07-20 DIAGNOSIS — Z85038 Personal history of other malignant neoplasm of large intestine: Secondary | ICD-10-CM | POA: Diagnosis not present

## 2011-07-20 DIAGNOSIS — C189 Malignant neoplasm of colon, unspecified: Secondary | ICD-10-CM

## 2011-07-20 DIAGNOSIS — M069 Rheumatoid arthritis, unspecified: Secondary | ICD-10-CM

## 2011-07-20 NOTE — Progress Notes (Signed)
This office note has been dictated.
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
## 2011-07-20 NOTE — Patient Instructions (Signed)
Melissa Reynolds  [id number] March 06, 1941   [facility]  Discharge Instructions  RECOMMENDATIONS MADE BY THE CONSULTANT AND ANY TEST RESULTS WILL BE SENT TO YOUR REFERRING DOCTOR.   EXAM FINDINGS BY MD TODAY AND SIGNS AND SYMPTOMS TO REPORT TO CLINIC OR PRIMARY MD: you are doing great.  We only need to see you once yearly.  MEDICATIONS PRESCRIBED: nonoe   INSTRUCTIONS GIVEN AND DISCUSSED: Other :  Report changes in your bowel habits, blood in stool, etc.  SPECIAL INSTRUCTIONS/FOLLOW-UP: Lab work Needed in 1 year and Return to Clinic in 1 year.  I acknowledge that I have been informed and understand all the instructions given to me and received a copy. I do not have any more questions at this time, but understand that I may call the Specialty Clinic at [facility] at [phone] during business hours should I have any further questions or need assistance in obtaining follow-up care.    __________________________________________  _____________  __________ Signature of Patient or Authorized Representative            Date                   Time    __________________________________________ Nurse's Signature

## 2011-07-22 DIAGNOSIS — M069 Rheumatoid arthritis, unspecified: Secondary | ICD-10-CM | POA: Diagnosis not present

## 2011-07-29 DIAGNOSIS — I10 Essential (primary) hypertension: Secondary | ICD-10-CM | POA: Diagnosis not present

## 2011-07-29 DIAGNOSIS — E669 Obesity, unspecified: Secondary | ICD-10-CM | POA: Diagnosis not present

## 2011-07-29 DIAGNOSIS — E119 Type 2 diabetes mellitus without complications: Secondary | ICD-10-CM | POA: Diagnosis not present

## 2011-08-05 DIAGNOSIS — M159 Polyosteoarthritis, unspecified: Secondary | ICD-10-CM | POA: Diagnosis not present

## 2011-08-05 DIAGNOSIS — M545 Low back pain, unspecified: Secondary | ICD-10-CM | POA: Diagnosis not present

## 2011-08-05 DIAGNOSIS — M069 Rheumatoid arthritis, unspecified: Secondary | ICD-10-CM | POA: Diagnosis not present

## 2011-08-10 DIAGNOSIS — M069 Rheumatoid arthritis, unspecified: Secondary | ICD-10-CM | POA: Diagnosis not present

## 2011-09-07 DIAGNOSIS — M069 Rheumatoid arthritis, unspecified: Secondary | ICD-10-CM | POA: Diagnosis not present

## 2011-09-30 DIAGNOSIS — M545 Low back pain, unspecified: Secondary | ICD-10-CM | POA: Diagnosis not present

## 2011-09-30 DIAGNOSIS — I10 Essential (primary) hypertension: Secondary | ICD-10-CM | POA: Diagnosis not present

## 2011-09-30 DIAGNOSIS — M069 Rheumatoid arthritis, unspecified: Secondary | ICD-10-CM | POA: Diagnosis not present

## 2011-09-30 DIAGNOSIS — M79609 Pain in unspecified limb: Secondary | ICD-10-CM | POA: Diagnosis not present

## 2011-09-30 DIAGNOSIS — M159 Polyosteoarthritis, unspecified: Secondary | ICD-10-CM | POA: Diagnosis not present

## 2011-10-07 DIAGNOSIS — Z961 Presence of intraocular lens: Secondary | ICD-10-CM | POA: Diagnosis not present

## 2011-10-07 DIAGNOSIS — E119 Type 2 diabetes mellitus without complications: Secondary | ICD-10-CM | POA: Diagnosis not present

## 2011-10-07 DIAGNOSIS — H1045 Other chronic allergic conjunctivitis: Secondary | ICD-10-CM | POA: Diagnosis not present

## 2011-10-25 DIAGNOSIS — M069 Rheumatoid arthritis, unspecified: Secondary | ICD-10-CM | POA: Diagnosis not present

## 2011-11-09 DIAGNOSIS — M069 Rheumatoid arthritis, unspecified: Secondary | ICD-10-CM | POA: Diagnosis not present

## 2011-11-09 DIAGNOSIS — M159 Polyosteoarthritis, unspecified: Secondary | ICD-10-CM | POA: Diagnosis not present

## 2011-11-09 DIAGNOSIS — M6530 Trigger finger, unspecified finger: Secondary | ICD-10-CM | POA: Diagnosis not present

## 2011-11-09 DIAGNOSIS — M545 Low back pain, unspecified: Secondary | ICD-10-CM | POA: Diagnosis not present

## 2011-12-09 DIAGNOSIS — M069 Rheumatoid arthritis, unspecified: Secondary | ICD-10-CM | POA: Diagnosis not present

## 2012-01-04 DIAGNOSIS — M069 Rheumatoid arthritis, unspecified: Secondary | ICD-10-CM | POA: Diagnosis not present

## 2012-01-04 DIAGNOSIS — M159 Polyosteoarthritis, unspecified: Secondary | ICD-10-CM | POA: Diagnosis not present

## 2012-01-04 DIAGNOSIS — I10 Essential (primary) hypertension: Secondary | ICD-10-CM | POA: Diagnosis not present

## 2012-01-04 DIAGNOSIS — M6530 Trigger finger, unspecified finger: Secondary | ICD-10-CM | POA: Diagnosis not present

## 2012-01-04 DIAGNOSIS — E119 Type 2 diabetes mellitus without complications: Secondary | ICD-10-CM | POA: Diagnosis not present

## 2012-01-04 DIAGNOSIS — M545 Low back pain, unspecified: Secondary | ICD-10-CM | POA: Diagnosis not present

## 2012-01-25 DIAGNOSIS — M069 Rheumatoid arthritis, unspecified: Secondary | ICD-10-CM | POA: Diagnosis not present

## 2012-02-04 DIAGNOSIS — Z23 Encounter for immunization: Secondary | ICD-10-CM | POA: Diagnosis not present

## 2012-02-08 ENCOUNTER — Other Ambulatory Visit (HOSPITAL_COMMUNITY): Payer: Self-pay | Admitting: Family Medicine

## 2012-02-08 DIAGNOSIS — I10 Essential (primary) hypertension: Secondary | ICD-10-CM | POA: Diagnosis not present

## 2012-02-08 DIAGNOSIS — M545 Low back pain, unspecified: Secondary | ICD-10-CM

## 2012-02-10 ENCOUNTER — Ambulatory Visit (HOSPITAL_COMMUNITY)
Admission: RE | Admit: 2012-02-10 | Discharge: 2012-02-10 | Disposition: A | Discharge status: Home / Self Care | Payer: Medicare Other | Source: Ambulatory Visit | Attending: Family Medicine | Admitting: Family Medicine

## 2012-02-10 DIAGNOSIS — M545 Low back pain, unspecified: Secondary | ICD-10-CM

## 2012-02-10 DIAGNOSIS — R109 Unspecified abdominal pain: Secondary | ICD-10-CM | POA: Insufficient documentation

## 2012-02-10 DIAGNOSIS — R911 Solitary pulmonary nodule: Secondary | ICD-10-CM | POA: Insufficient documentation

## 2012-02-10 DIAGNOSIS — Z9049 Acquired absence of other specified parts of digestive tract: Secondary | ICD-10-CM | POA: Insufficient documentation

## 2012-02-10 DIAGNOSIS — Z85038 Personal history of other malignant neoplasm of large intestine: Secondary | ICD-10-CM | POA: Diagnosis not present

## 2012-02-10 MED ORDER — IOHEXOL 300 MG/ML  SOLN
100.0000 mL | Freq: Once | INTRAMUSCULAR | Status: AC | PRN
  Administered 2012-02-10: 100 mL via INTRAVENOUS

## 2012-02-17 DIAGNOSIS — N390 Urinary tract infection, site not specified: Secondary | ICD-10-CM | POA: Diagnosis not present

## 2012-02-17 DIAGNOSIS — E119 Type 2 diabetes mellitus without complications: Secondary | ICD-10-CM | POA: Diagnosis not present

## 2012-02-17 DIAGNOSIS — I10 Essential (primary) hypertension: Secondary | ICD-10-CM | POA: Diagnosis not present

## 2012-02-23 DIAGNOSIS — E119 Type 2 diabetes mellitus without complications: Secondary | ICD-10-CM | POA: Diagnosis not present

## 2012-02-23 DIAGNOSIS — I10 Essential (primary) hypertension: Secondary | ICD-10-CM | POA: Diagnosis not present

## 2012-02-23 DIAGNOSIS — D72829 Elevated white blood cell count, unspecified: Secondary | ICD-10-CM | POA: Diagnosis not present

## 2012-02-23 DIAGNOSIS — R109 Unspecified abdominal pain: Secondary | ICD-10-CM | POA: Diagnosis not present

## 2012-02-25 DIAGNOSIS — N390 Urinary tract infection, site not specified: Secondary | ICD-10-CM | POA: Diagnosis not present

## 2012-03-09 DIAGNOSIS — M069 Rheumatoid arthritis, unspecified: Secondary | ICD-10-CM | POA: Diagnosis not present

## 2012-04-04 DIAGNOSIS — I10 Essential (primary) hypertension: Secondary | ICD-10-CM | POA: Diagnosis not present

## 2012-04-04 DIAGNOSIS — E119 Type 2 diabetes mellitus without complications: Secondary | ICD-10-CM | POA: Diagnosis not present

## 2012-04-18 DIAGNOSIS — M069 Rheumatoid arthritis, unspecified: Secondary | ICD-10-CM | POA: Diagnosis not present

## 2012-04-27 DIAGNOSIS — M545 Low back pain, unspecified: Secondary | ICD-10-CM | POA: Diagnosis not present

## 2012-04-27 DIAGNOSIS — M79609 Pain in unspecified limb: Secondary | ICD-10-CM | POA: Diagnosis not present

## 2012-04-27 DIAGNOSIS — M159 Polyosteoarthritis, unspecified: Secondary | ICD-10-CM | POA: Diagnosis not present

## 2012-04-27 DIAGNOSIS — M25519 Pain in unspecified shoulder: Secondary | ICD-10-CM | POA: Diagnosis not present

## 2012-04-27 DIAGNOSIS — M6530 Trigger finger, unspecified finger: Secondary | ICD-10-CM | POA: Diagnosis not present

## 2012-04-27 DIAGNOSIS — M069 Rheumatoid arthritis, unspecified: Secondary | ICD-10-CM | POA: Diagnosis not present

## 2012-05-18 DIAGNOSIS — E119 Type 2 diabetes mellitus without complications: Secondary | ICD-10-CM | POA: Diagnosis not present

## 2012-05-18 DIAGNOSIS — I10 Essential (primary) hypertension: Secondary | ICD-10-CM | POA: Diagnosis not present

## 2012-05-18 DIAGNOSIS — N390 Urinary tract infection, site not specified: Secondary | ICD-10-CM | POA: Diagnosis not present

## 2012-05-26 ENCOUNTER — Other Ambulatory Visit (HOSPITAL_COMMUNITY): Payer: Self-pay | Admitting: Family Medicine

## 2012-05-26 DIAGNOSIS — Z139 Encounter for screening, unspecified: Secondary | ICD-10-CM

## 2012-05-30 ENCOUNTER — Ambulatory Visit (HOSPITAL_COMMUNITY)
Admission: RE | Admit: 2012-05-30 | Discharge: 2012-05-30 | Disposition: A | Discharge status: Home / Self Care | Payer: Medicare Other | Source: Ambulatory Visit | Attending: Family Medicine | Admitting: Family Medicine

## 2012-05-30 DIAGNOSIS — Z1231 Encounter for screening mammogram for malignant neoplasm of breast: Secondary | ICD-10-CM | POA: Insufficient documentation

## 2012-05-30 DIAGNOSIS — M069 Rheumatoid arthritis, unspecified: Secondary | ICD-10-CM | POA: Diagnosis not present

## 2012-05-30 DIAGNOSIS — Z139 Encounter for screening, unspecified: Secondary | ICD-10-CM

## 2012-06-27 DIAGNOSIS — M069 Rheumatoid arthritis, unspecified: Secondary | ICD-10-CM | POA: Diagnosis not present

## 2012-06-29 DIAGNOSIS — M159 Polyosteoarthritis, unspecified: Secondary | ICD-10-CM | POA: Diagnosis not present

## 2012-06-29 DIAGNOSIS — M545 Low back pain, unspecified: Secondary | ICD-10-CM | POA: Diagnosis not present

## 2012-06-29 DIAGNOSIS — M6530 Trigger finger, unspecified finger: Secondary | ICD-10-CM | POA: Diagnosis not present

## 2012-06-29 DIAGNOSIS — M069 Rheumatoid arthritis, unspecified: Secondary | ICD-10-CM | POA: Diagnosis not present

## 2012-07-13 DIAGNOSIS — E119 Type 2 diabetes mellitus without complications: Secondary | ICD-10-CM | POA: Diagnosis not present

## 2012-07-13 DIAGNOSIS — I10 Essential (primary) hypertension: Secondary | ICD-10-CM | POA: Diagnosis not present

## 2012-07-17 ENCOUNTER — Encounter (HOSPITAL_COMMUNITY): Payer: Medicare Other | Attending: Oncology

## 2012-07-17 ENCOUNTER — Other Ambulatory Visit (HOSPITAL_COMMUNITY): Payer: Self-pay | Admitting: Oncology

## 2012-07-17 DIAGNOSIS — I10 Essential (primary) hypertension: Secondary | ICD-10-CM | POA: Diagnosis not present

## 2012-07-17 DIAGNOSIS — E119 Type 2 diabetes mellitus without complications: Secondary | ICD-10-CM | POA: Diagnosis not present

## 2012-07-17 DIAGNOSIS — Z09 Encounter for follow-up examination after completed treatment for conditions other than malignant neoplasm: Secondary | ICD-10-CM | POA: Insufficient documentation

## 2012-07-17 DIAGNOSIS — Z85038 Personal history of other malignant neoplasm of large intestine: Secondary | ICD-10-CM

## 2012-07-17 DIAGNOSIS — C189 Malignant neoplasm of colon, unspecified: Secondary | ICD-10-CM

## 2012-07-17 DIAGNOSIS — E876 Hypokalemia: Secondary | ICD-10-CM

## 2012-07-17 LAB — DIFFERENTIAL
Basophils Absolute: 0.1 K/uL (ref 0.0–0.1)
Basophils Relative: 1 % (ref 0–1)
Eosinophils Absolute: 0.3 K/uL (ref 0.0–0.7)
Eosinophils Relative: 3 % (ref 0–5)
Lymphocytes Relative: 27 % (ref 12–46)
Lymphs Abs: 2.7 K/uL (ref 0.7–4.0)
Monocytes Absolute: 0.5 K/uL (ref 0.1–1.0)
Monocytes Relative: 5 % (ref 3–12)
Neutro Abs: 6.4 K/uL (ref 1.7–7.7)
Neutrophils Relative %: 65 % (ref 43–77)

## 2012-07-17 LAB — COMPREHENSIVE METABOLIC PANEL WITH GFR
ALT: 10 U/L (ref 0–35)
AST: 13 U/L (ref 0–37)
Albumin: 3.3 g/dL — ABNORMAL LOW (ref 3.5–5.2)
Alkaline Phosphatase: 74 U/L (ref 39–117)
BUN: 14 mg/dL (ref 6–23)
CO2: 29 meq/L (ref 19–32)
Calcium: 9 mg/dL (ref 8.4–10.5)
Chloride: 104 meq/L (ref 96–112)
Creatinine, Ser: 0.67 mg/dL (ref 0.50–1.10)
GFR calc Af Amer: >90 mL/min (ref >90)
GFR calc non Af Amer: 85 mL/min — ABNORMAL LOW (ref >90)
Glucose, Bld: 152 mg/dL — ABNORMAL HIGH (ref 70–99)
Potassium: 3.3 meq/L — ABNORMAL LOW (ref 3.5–5.1)
Sodium: 143 meq/L (ref 135–145)
Total Bilirubin: 1 mg/dL (ref 0.3–1.2)
Total Protein: 6.9 g/dL (ref 6.0–8.3)

## 2012-07-17 LAB — CBC
Hemoglobin: 12.8 g/dL (ref 12.0–15.0)
MCH: 26.3 pg (ref 26.0–34.0)
RBC: 4.87 MIL/uL (ref 3.87–5.11)

## 2012-07-17 LAB — CEA: CEA: 1.4 ng/mL (ref 0.0–5.0)

## 2012-07-17 MED ORDER — POTASSIUM CHLORIDE ER 10 MEQ PO TBCR: 10.0000 meq | EXTENDED_RELEASE_TABLET | Freq: Two times a day (BID) | ORAL | Status: DC

## 2012-07-17 NOTE — Progress Notes (Signed)
Labs drawn today for cbc/diff,cea,cmp 

## 2012-07-18 ENCOUNTER — Other Ambulatory Visit (HOSPITAL_COMMUNITY): Payer: Medicare Other

## 2012-07-18 ENCOUNTER — Encounter (HOSPITAL_BASED_OUTPATIENT_CLINIC_OR_DEPARTMENT_OTHER): Payer: Medicare Other | Admitting: Oncology

## 2012-07-18 VITALS — BP 185/79 | HR 73 | Temp 97.5°F | Resp 18 | Wt 210.7 lb

## 2012-07-18 DIAGNOSIS — C189 Malignant neoplasm of colon, unspecified: Secondary | ICD-10-CM

## 2012-07-18 DIAGNOSIS — Z85038 Personal history of other malignant neoplasm of large intestine: Secondary | ICD-10-CM

## 2012-07-18 DIAGNOSIS — E119 Type 2 diabetes mellitus without complications: Secondary | ICD-10-CM

## 2012-07-18 DIAGNOSIS — E876 Hypokalemia: Secondary | ICD-10-CM | POA: Diagnosis not present

## 2012-07-18 NOTE — Patient Instructions (Addendum)
[facility] Discharge Instructions  RECOMMENDATIONS MADE BY THE CONSULTANT AND ANY TEST RESULTS WILL BE SENT TO YOUR REFERRING PHYSICIAN.  EXAM FINDINGS BY THE PHYSICIAN TODAY AND SIGNS OR SYMPTOMS TO REPORT TO CLINIC OR PRIMARY PHYSICIAN: Exam and discussion by MD.  Bonita Quin are doing well.  We will recheck your potassium level in 1 month.  If your potassium level is ok then you can take the potassium with your Hyzaar.  MEDICATIONS PRESCRIBED:  none  INSTRUCTIONS GIVEN AND DISCUSSED: Report changes in your bowel habits, blood in your stool, etc.  SPECIAL INSTRUCTIONS/FOLLOW-UP: 1 month for blood work, 1 year for blood work and to be seen in follow-up in 1 year.  Thank you for choosing [facility] to provide your oncology and hematology care.  To afford each patient quality time with our providers, please arrive at least 15 minutes before your scheduled appointment time.  With your help, our goal is to use those 15 minutes to complete the necessary work-up to ensure our physicians have the information they need to help with your evaluation and healthcare recommendations.    Effective January 1st, 2014, we ask that you re-schedule your appointment with our physicians should you arrive 10 or more minutes late for your appointment.  We strive to give you quality time with our providers, and arriving late affects you and other patients whose appointments are after yours.    Again, thank you for choosing [facility]  Our hope is that these requests will decrease the amount of time that you wait before being seen by our physicians.       _____________________________________________________________  Should you have questions after your visit to [facility], please contact our office at [phone] between the hours of 8:30 a.m. and 5:00 p.m.  Voicemails left after 4:30 p.m. will not be returned until the following business day.  For  prescription refill requests, have your pharmacy contact our office with your prescription refill request.
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
## 2012-07-19 ENCOUNTER — Ambulatory Visit (HOSPITAL_COMMUNITY): Payer: Medicare Other | Admitting: Oncology

## 2012-07-25 DIAGNOSIS — I10 Essential (primary) hypertension: Secondary | ICD-10-CM | POA: Diagnosis not present

## 2012-07-25 DIAGNOSIS — E162 Hypoglycemia, unspecified: Secondary | ICD-10-CM | POA: Diagnosis not present

## 2012-07-25 DIAGNOSIS — J209 Acute bronchitis, unspecified: Secondary | ICD-10-CM | POA: Diagnosis not present

## 2012-08-01 DIAGNOSIS — J069 Acute upper respiratory infection, unspecified: Secondary | ICD-10-CM | POA: Diagnosis not present

## 2012-08-08 DIAGNOSIS — J069 Acute upper respiratory infection, unspecified: Secondary | ICD-10-CM | POA: Diagnosis not present

## 2012-08-08 DIAGNOSIS — E119 Type 2 diabetes mellitus without complications: Secondary | ICD-10-CM | POA: Diagnosis not present

## 2012-08-15 ENCOUNTER — Other Ambulatory Visit (HOSPITAL_COMMUNITY): Payer: Self-pay

## 2012-08-15 ENCOUNTER — Encounter (HOSPITAL_COMMUNITY): Payer: Medicare Other | Attending: Oncology

## 2012-08-15 DIAGNOSIS — E876 Hypokalemia: Secondary | ICD-10-CM | POA: Diagnosis not present

## 2012-08-15 DIAGNOSIS — Z85038 Personal history of other malignant neoplasm of large intestine: Secondary | ICD-10-CM

## 2012-08-15 DIAGNOSIS — C189 Malignant neoplasm of colon, unspecified: Secondary | ICD-10-CM | POA: Insufficient documentation

## 2012-08-15 LAB — BASIC METABOLIC PANEL
Calcium: 9 mg/dL (ref 8.4–10.5)
Creatinine, Ser: 0.68 mg/dL (ref 0.50–1.10)
GFR calc Af Amer: >90 mL/min (ref >90)

## 2012-08-15 MED ORDER — POTASSIUM CHLORIDE ER 10 MEQ PO TBCR: 10.0000 meq | EXTENDED_RELEASE_TABLET | Freq: Three times a day (TID) | ORAL | Status: DC

## 2012-08-15 NOTE — Progress Notes (Signed)
Labs drawn today for bmp 

## 2012-08-22 DIAGNOSIS — M069 Rheumatoid arthritis, unspecified: Secondary | ICD-10-CM | POA: Diagnosis not present

## 2012-08-29 DIAGNOSIS — N390 Urinary tract infection, site not specified: Secondary | ICD-10-CM | POA: Diagnosis not present

## 2012-09-05 DIAGNOSIS — M069 Rheumatoid arthritis, unspecified: Secondary | ICD-10-CM | POA: Diagnosis not present

## 2012-09-26 DIAGNOSIS — M159 Polyosteoarthritis, unspecified: Secondary | ICD-10-CM | POA: Diagnosis not present

## 2012-09-26 DIAGNOSIS — M069 Rheumatoid arthritis, unspecified: Secondary | ICD-10-CM | POA: Diagnosis not present

## 2012-09-26 DIAGNOSIS — M545 Low back pain, unspecified: Secondary | ICD-10-CM | POA: Diagnosis not present

## 2012-09-26 DIAGNOSIS — M6530 Trigger finger, unspecified finger: Secondary | ICD-10-CM | POA: Diagnosis not present

## 2012-10-12 DIAGNOSIS — E119 Type 2 diabetes mellitus without complications: Secondary | ICD-10-CM | POA: Diagnosis not present

## 2012-10-12 DIAGNOSIS — H1045 Other chronic allergic conjunctivitis: Secondary | ICD-10-CM | POA: Diagnosis not present

## 2012-10-12 DIAGNOSIS — Z961 Presence of intraocular lens: Secondary | ICD-10-CM | POA: Diagnosis not present

## 2012-10-17 DIAGNOSIS — M069 Rheumatoid arthritis, unspecified: Secondary | ICD-10-CM | POA: Diagnosis not present

## 2012-11-07 ENCOUNTER — Telehealth (HOSPITAL_COMMUNITY): Payer: Self-pay

## 2012-11-07 ENCOUNTER — Other Ambulatory Visit (HOSPITAL_COMMUNITY): Payer: Self-pay | Admitting: Oncology

## 2012-11-07 DIAGNOSIS — E876 Hypokalemia: Secondary | ICD-10-CM

## 2012-11-07 NOTE — Telephone Encounter (Signed)
Appointment scheduled to recheck BMET on 11/14/12.  Spoke with sister Toney Reil) who will let Clydette know about the appointment.

## 2012-11-07 NOTE — Telephone Encounter (Signed)
BMET placed.  She may come in at her convienience

## 2012-11-07 NOTE — Telephone Encounter (Signed)
Placed on Potassium in April.  Patient still has 1 refill left and is taking 10 meq tid.  When do we need to recheck K+?

## 2012-11-14 ENCOUNTER — Other Ambulatory Visit (HOSPITAL_COMMUNITY): Payer: Self-pay | Admitting: Oncology

## 2012-11-14 ENCOUNTER — Encounter (HOSPITAL_COMMUNITY): Payer: Medicare Other | Attending: Oncology

## 2012-11-14 DIAGNOSIS — E876 Hypokalemia: Secondary | ICD-10-CM | POA: Diagnosis not present

## 2012-11-14 LAB — BASIC METABOLIC PANEL
Calcium: 8.9 mg/dL (ref 8.4–10.5)
GFR calc non Af Amer: 83 mL/min — ABNORMAL LOW (ref >90)
Glucose, Bld: 144 mg/dL — ABNORMAL HIGH (ref 70–99)
Sodium: 143 mEq/L (ref 135–145)

## 2012-11-14 NOTE — Progress Notes (Signed)
Labs drawn today for bmp 

## 2012-11-16 DIAGNOSIS — I2510 Atherosclerotic heart disease of native coronary artery without angina pectoris: Secondary | ICD-10-CM | POA: Diagnosis not present

## 2012-11-16 DIAGNOSIS — E119 Type 2 diabetes mellitus without complications: Secondary | ICD-10-CM | POA: Diagnosis not present

## 2012-11-16 DIAGNOSIS — I10 Essential (primary) hypertension: Secondary | ICD-10-CM | POA: Diagnosis not present

## 2012-12-12 DIAGNOSIS — M069 Rheumatoid arthritis, unspecified: Secondary | ICD-10-CM | POA: Diagnosis not present

## 2012-12-14 ENCOUNTER — Encounter (HOSPITAL_COMMUNITY): Payer: Medicare Other | Attending: Oncology

## 2012-12-14 DIAGNOSIS — E876 Hypokalemia: Secondary | ICD-10-CM | POA: Insufficient documentation

## 2012-12-14 DIAGNOSIS — Z85038 Personal history of other malignant neoplasm of large intestine: Secondary | ICD-10-CM

## 2012-12-14 LAB — BASIC METABOLIC PANEL
BUN: 18 mg/dL (ref 6–23)
Calcium: 9 mg/dL (ref 8.4–10.5)
GFR calc Af Amer: >90 mL/min (ref >90)
GFR calc non Af Amer: 80 mL/min — ABNORMAL LOW (ref >90)
Potassium: 3.7 mEq/L (ref 3.5–5.1)

## 2012-12-14 NOTE — Progress Notes (Signed)
Labs drawn today for bmp 

## 2013-01-02 DIAGNOSIS — M069 Rheumatoid arthritis, unspecified: Secondary | ICD-10-CM | POA: Diagnosis not present

## 2013-01-11 DIAGNOSIS — E119 Type 2 diabetes mellitus without complications: Secondary | ICD-10-CM | POA: Diagnosis not present

## 2013-01-11 DIAGNOSIS — R634 Abnormal weight loss: Secondary | ICD-10-CM | POA: Diagnosis not present

## 2013-01-11 DIAGNOSIS — I10 Essential (primary) hypertension: Secondary | ICD-10-CM | POA: Diagnosis not present

## 2013-01-16 DIAGNOSIS — M159 Polyosteoarthritis, unspecified: Secondary | ICD-10-CM | POA: Diagnosis not present

## 2013-01-16 DIAGNOSIS — M545 Low back pain, unspecified: Secondary | ICD-10-CM | POA: Diagnosis not present

## 2013-01-16 DIAGNOSIS — M6530 Trigger finger, unspecified finger: Secondary | ICD-10-CM | POA: Diagnosis not present

## 2013-01-16 DIAGNOSIS — M069 Rheumatoid arthritis, unspecified: Secondary | ICD-10-CM | POA: Diagnosis not present

## 2013-01-23 DIAGNOSIS — IMO0002 Reserved for concepts with insufficient information to code with codable children: Secondary | ICD-10-CM | POA: Diagnosis not present

## 2013-01-23 DIAGNOSIS — R269 Unspecified abnormalities of gait and mobility: Secondary | ICD-10-CM | POA: Diagnosis not present

## 2013-01-23 DIAGNOSIS — G5600 Carpal tunnel syndrome, unspecified upper limb: Secondary | ICD-10-CM | POA: Diagnosis not present

## 2013-01-29 ENCOUNTER — Other Ambulatory Visit: Payer: Self-pay | Admitting: Neurosurgery

## 2013-01-29 DIAGNOSIS — IMO0002 Reserved for concepts with insufficient information to code with codable children: Secondary | ICD-10-CM

## 2013-01-30 DIAGNOSIS — Z23 Encounter for immunization: Secondary | ICD-10-CM | POA: Diagnosis not present

## 2013-02-06 DIAGNOSIS — M069 Rheumatoid arthritis, unspecified: Secondary | ICD-10-CM | POA: Diagnosis not present

## 2013-02-08 ENCOUNTER — Ambulatory Visit
Admission: RE | Admit: 2013-02-08 | Discharge: 2013-02-08 | Disposition: A | Discharge status: Home / Self Care | Payer: Medicare Other | Source: Ambulatory Visit | Attending: Neurosurgery | Admitting: Neurosurgery

## 2013-02-08 DIAGNOSIS — IMO0002 Reserved for concepts with insufficient information to code with codable children: Secondary | ICD-10-CM

## 2013-02-08 DIAGNOSIS — M5126 Other intervertebral disc displacement, lumbar region: Secondary | ICD-10-CM | POA: Diagnosis not present

## 2013-02-08 DIAGNOSIS — M48061 Spinal stenosis, lumbar region without neurogenic claudication: Secondary | ICD-10-CM | POA: Diagnosis not present

## 2013-02-08 DIAGNOSIS — M47817 Spondylosis without myelopathy or radiculopathy, lumbosacral region: Secondary | ICD-10-CM | POA: Diagnosis not present

## 2013-02-13 DIAGNOSIS — IMO0002 Reserved for concepts with insufficient information to code with codable children: Secondary | ICD-10-CM | POA: Diagnosis not present

## 2013-02-13 DIAGNOSIS — M47812 Spondylosis without myelopathy or radiculopathy, cervical region: Secondary | ICD-10-CM | POA: Diagnosis not present

## 2013-02-13 DIAGNOSIS — M4310 Spondylolisthesis, site unspecified: Secondary | ICD-10-CM | POA: Diagnosis not present

## 2013-02-13 DIAGNOSIS — R269 Unspecified abnormalities of gait and mobility: Secondary | ICD-10-CM | POA: Diagnosis not present

## 2013-02-22 DIAGNOSIS — M79609 Pain in unspecified limb: Secondary | ICD-10-CM | POA: Diagnosis not present

## 2013-02-22 DIAGNOSIS — M069 Rheumatoid arthritis, unspecified: Secondary | ICD-10-CM | POA: Diagnosis not present

## 2013-02-22 DIAGNOSIS — M159 Polyosteoarthritis, unspecified: Secondary | ICD-10-CM | POA: Diagnosis not present

## 2013-02-22 DIAGNOSIS — M545 Low back pain, unspecified: Secondary | ICD-10-CM | POA: Diagnosis not present

## 2013-02-22 DIAGNOSIS — M6530 Trigger finger, unspecified finger: Secondary | ICD-10-CM | POA: Diagnosis not present

## 2013-02-27 DIAGNOSIS — M069 Rheumatoid arthritis, unspecified: Secondary | ICD-10-CM | POA: Diagnosis not present

## 2013-02-27 DIAGNOSIS — E119 Type 2 diabetes mellitus without complications: Secondary | ICD-10-CM | POA: Diagnosis not present

## 2013-02-27 DIAGNOSIS — I10 Essential (primary) hypertension: Secondary | ICD-10-CM | POA: Diagnosis not present

## 2013-03-06 DIAGNOSIS — M069 Rheumatoid arthritis, unspecified: Secondary | ICD-10-CM | POA: Diagnosis not present

## 2013-04-12 DIAGNOSIS — R05 Cough: Secondary | ICD-10-CM | POA: Diagnosis not present

## 2013-04-12 DIAGNOSIS — E119 Type 2 diabetes mellitus without complications: Secondary | ICD-10-CM | POA: Diagnosis not present

## 2013-04-12 DIAGNOSIS — I2510 Atherosclerotic heart disease of native coronary artery without angina pectoris: Secondary | ICD-10-CM | POA: Diagnosis not present

## 2013-04-12 DIAGNOSIS — J069 Acute upper respiratory infection, unspecified: Secondary | ICD-10-CM | POA: Diagnosis not present

## 2013-04-12 DIAGNOSIS — R059 Cough, unspecified: Secondary | ICD-10-CM | POA: Diagnosis not present

## 2013-04-17 DIAGNOSIS — M069 Rheumatoid arthritis, unspecified: Secondary | ICD-10-CM | POA: Diagnosis not present

## 2013-04-18 ENCOUNTER — Other Ambulatory Visit (HOSPITAL_COMMUNITY): Payer: Self-pay | Admitting: Cardiology

## 2013-04-18 DIAGNOSIS — R1310 Dysphagia, unspecified: Secondary | ICD-10-CM

## 2013-04-23 ENCOUNTER — Other Ambulatory Visit (HOSPITAL_COMMUNITY): Payer: Medicare Other

## 2013-04-24 ENCOUNTER — Ambulatory Visit (HOSPITAL_COMMUNITY)
Admission: RE | Admit: 2013-04-24 | Discharge: 2013-04-24 | Disposition: A | Discharge status: Home / Self Care | Payer: Medicare Other | Source: Ambulatory Visit | Attending: Cardiology | Admitting: Cardiology

## 2013-04-24 DIAGNOSIS — K224 Dyskinesia of esophagus: Secondary | ICD-10-CM | POA: Insufficient documentation

## 2013-04-24 DIAGNOSIS — K228 Other specified diseases of esophagus: Secondary | ICD-10-CM | POA: Diagnosis not present

## 2013-04-24 DIAGNOSIS — R1310 Dysphagia, unspecified: Secondary | ICD-10-CM | POA: Diagnosis not present

## 2013-04-24 DIAGNOSIS — K2289 Other specified disease of esophagus: Secondary | ICD-10-CM | POA: Diagnosis not present

## 2013-04-26 DIAGNOSIS — M069 Rheumatoid arthritis, unspecified: Secondary | ICD-10-CM | POA: Diagnosis not present

## 2013-05-18 ENCOUNTER — Encounter (HOSPITAL_COMMUNITY): Payer: Self-pay | Admitting: Emergency Medicine

## 2013-05-18 ENCOUNTER — Emergency Department (HOSPITAL_COMMUNITY): Payer: Medicare Other

## 2013-05-18 ENCOUNTER — Emergency Department (HOSPITAL_COMMUNITY)
Admission: EM | Admit: 2013-05-18 | Discharge: 2013-05-18 | Disposition: A | Discharge status: Home / Self Care | Payer: Medicare Other | Attending: Emergency Medicine | Admitting: Emergency Medicine

## 2013-05-18 DIAGNOSIS — Z85038 Personal history of other malignant neoplasm of large intestine: Secondary | ICD-10-CM | POA: Diagnosis not present

## 2013-05-18 DIAGNOSIS — E876 Hypokalemia: Secondary | ICD-10-CM | POA: Insufficient documentation

## 2013-05-18 DIAGNOSIS — R109 Unspecified abdominal pain: Secondary | ICD-10-CM | POA: Insufficient documentation

## 2013-05-18 DIAGNOSIS — E669 Obesity, unspecified: Secondary | ICD-10-CM | POA: Diagnosis not present

## 2013-05-18 DIAGNOSIS — Z7982 Long term (current) use of aspirin: Secondary | ICD-10-CM | POA: Diagnosis not present

## 2013-05-18 DIAGNOSIS — Z86718 Personal history of other venous thrombosis and embolism: Secondary | ICD-10-CM | POA: Insufficient documentation

## 2013-05-18 DIAGNOSIS — E119 Type 2 diabetes mellitus without complications: Secondary | ICD-10-CM | POA: Diagnosis not present

## 2013-05-18 DIAGNOSIS — Z90710 Acquired absence of both cervix and uterus: Secondary | ICD-10-CM | POA: Insufficient documentation

## 2013-05-18 DIAGNOSIS — Z9089 Acquired absence of other organs: Secondary | ICD-10-CM | POA: Diagnosis not present

## 2013-05-18 DIAGNOSIS — Z79899 Other long term (current) drug therapy: Secondary | ICD-10-CM | POA: Insufficient documentation

## 2013-05-18 DIAGNOSIS — R112 Nausea with vomiting, unspecified: Secondary | ICD-10-CM | POA: Diagnosis not present

## 2013-05-18 DIAGNOSIS — M069 Rheumatoid arthritis, unspecified: Secondary | ICD-10-CM | POA: Diagnosis not present

## 2013-05-18 DIAGNOSIS — R350 Frequency of micturition: Secondary | ICD-10-CM | POA: Diagnosis not present

## 2013-05-18 DIAGNOSIS — R1031 Right lower quadrant pain: Secondary | ICD-10-CM | POA: Diagnosis not present

## 2013-05-18 LAB — CBC WITH DIFFERENTIAL/PLATELET
Basophils Absolute: 0 10*3/uL (ref 0.0–0.1)
Basophils Relative: 0 % (ref 0–1)
EOS ABS: 0.1 10*3/uL (ref 0.0–0.7)
EOS PCT: 1 % (ref 0–5)
HEMATOCRIT: 35 % — AB (ref 36.0–46.0)
Hemoglobin: 12.3 g/dL (ref 12.0–15.0)
LYMPHS ABS: 2.4 10*3/uL (ref 0.7–4.0)
LYMPHS PCT: 24 % (ref 12–46)
MCH: 26.3 pg (ref 26.0–34.0)
MCHC: 35.1 g/dL (ref 30.0–36.0)
MCV: 74.8 fL — AB (ref 78.0–100.0)
MONO ABS: 0.6 10*3/uL (ref 0.1–1.0)
Monocytes Relative: 5 % (ref 3–12)
Neutro Abs: 7.1 10*3/uL (ref 1.7–7.7)
Neutrophils Relative %: 69 % (ref 43–77)
Platelets: 213 10*3/uL (ref 150–400)
RBC: 4.68 MIL/uL (ref 3.87–5.11)
RDW: 13.9 % (ref 11.5–15.5)
WBC: 10.3 10*3/uL (ref 4.0–10.5)

## 2013-05-18 LAB — URINALYSIS, ROUTINE W REFLEX MICROSCOPIC
Bilirubin Urine: NEGATIVE
Glucose, UA: NEGATIVE mg/dL
HGB URINE DIPSTICK: NEGATIVE
Ketones, ur: NEGATIVE mg/dL
NITRITE: NEGATIVE
PROTEIN: NEGATIVE mg/dL
Specific Gravity, Urine: 1.02 (ref 1.005–1.030)
UROBILINOGEN UA: 0.2 mg/dL (ref 0.0–1.0)
pH: 6.5 (ref 5.0–8.0)

## 2013-05-18 LAB — COMPREHENSIVE METABOLIC PANEL
ALT: 11 U/L (ref 0–35)
AST: 15 U/L (ref 0–37)
Albumin: 3.4 g/dL — ABNORMAL LOW (ref 3.5–5.2)
Alkaline Phosphatase: 78 U/L (ref 39–117)
BUN: 12 mg/dL (ref 6–23)
CALCIUM: 8.8 mg/dL (ref 8.4–10.5)
CO2: 30 meq/L (ref 19–32)
CREATININE: 0.65 mg/dL (ref 0.50–1.10)
Chloride: 101 mEq/L (ref 96–112)
GFR, EST NON AFRICAN AMERICAN: 85 mL/min — AB (ref >90)
GLUCOSE: 129 mg/dL — AB (ref 70–99)
Potassium: 3.1 mEq/L — ABNORMAL LOW (ref 3.7–5.3)
Sodium: 142 mEq/L (ref 137–147)
Total Bilirubin: 0.9 mg/dL (ref 0.3–1.2)
Total Protein: 6.8 g/dL (ref 6.0–8.3)

## 2013-05-18 LAB — LIPASE, BLOOD: LIPASE: 19 U/L (ref 11–59)

## 2013-05-18 LAB — URINE MICROSCOPIC-ADD ON

## 2013-05-18 MED ORDER — IOHEXOL 300 MG/ML  SOLN
100.0000 mL | Freq: Once | INTRAMUSCULAR | Status: AC | PRN
  Administered 2013-05-18: 100 mL via INTRAVENOUS

## 2013-05-18 MED ORDER — POTASSIUM CHLORIDE CRYS ER 20 MEQ PO TBCR
40.0000 meq | EXTENDED_RELEASE_TABLET | Freq: Once | ORAL | Status: AC
  Administered 2013-05-18: 40 meq via ORAL
  Filled 2013-05-18: qty 2

## 2013-05-18 MED ORDER — ONDANSETRON HCL 4 MG/2ML IJ SOLN
4.0000 mg | Freq: Once | INTRAMUSCULAR | Status: AC
  Administered 2013-05-18: 4 mg via INTRAVENOUS
  Filled 2013-05-18: qty 2

## 2013-05-18 MED ORDER — SODIUM CHLORIDE 0.9 % IV SOLN
INTRAVENOUS | Status: DC
  Administered 2013-05-18: 19:00:00 via INTRAVENOUS

## 2013-05-18 MED ORDER — IOHEXOL 300 MG/ML  SOLN
50.0000 mL | Freq: Once | INTRAMUSCULAR | Status: AC | PRN
  Administered 2013-05-18: 50 mL via INTRAVENOUS
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
## 2013-05-18 NOTE — Discharge Instructions (Signed)
Return here for fever, vomiting, or severe abdominal pain Abdominal Pain Abdominal pain can be caused by many things. Your caregiver decides the seriousness of your pain by an examination and possibly blood tests and X-rays. Many cases can be observed and treated at home. Most abdominal pain is not caused by a disease and will probably improve without treatment. However, in many cases, more time must pass before a clear cause of the pain can be found. Before that point, it may not be known if you need more testing, or if hospitalization or surgery is needed. HOME CARE INSTRUCTIONS   Do not take laxatives unless directed by your caregiver.  Take pain medicine only as directed by your caregiver.  Only take over-the-counter or prescription medicines for pain, discomfort, or fever as directed by your caregiver.  Try a clear liquid diet (broth, tea, or water) for as long as directed by your caregiver. Slowly move to a bland diet as tolerated. SEEK IMMEDIATE MEDICAL CARE IF:   The pain does not go away.  You have a fever.  You keep throwing up (vomiting).  The pain is felt only in portions of the abdomen. Pain in the right side could possibly be appendicitis. In an adult, pain in the left lower portion of the abdomen could be colitis or diverticulitis.  You pass bloody or black tarry stools. MAKE SURE YOU:   Understand these instructions.  Will watch your condition.  Will get help right away if you are not doing well or get worse. Document Released: 02/03/2005 Document Revised: 07/19/2011 Document Reviewed: 12/13/2007 [facility] Patient Information 2014 [place].

## 2013-05-18 NOTE — ED Notes (Signed)
Patient with no complaints at this time. Respirations even and unlabored. Skin warm/dry. Discharge instructions reviewed with patient at this time. Patient given opportunity to voice concerns/ask questions. IV removed per policy and band-aid applied to site. Patient discharged at this time and left Emergency Department with steady gait.  

## 2013-05-18 NOTE — ED Notes (Addendum)
abd pain, vomiting, no diarrhea, no fever.  Normal bm yesterday

## 2013-05-20 LAB — URINE CULTURE
Colony Count: NO GROWTH
Culture: NO GROWTH

## 2013-05-31 DIAGNOSIS — M999 Biomechanical lesion, unspecified: Secondary | ICD-10-CM | POA: Diagnosis not present

## 2013-05-31 DIAGNOSIS — S335XXA Sprain of ligaments of lumbar spine, initial encounter: Secondary | ICD-10-CM | POA: Diagnosis not present

## 2013-05-31 DIAGNOSIS — M47817 Spondylosis without myelopathy or radiculopathy, lumbosacral region: Secondary | ICD-10-CM | POA: Diagnosis not present

## 2013-06-04 DIAGNOSIS — M546 Pain in thoracic spine: Secondary | ICD-10-CM | POA: Diagnosis not present

## 2013-06-04 DIAGNOSIS — M4712 Other spondylosis with myelopathy, cervical region: Secondary | ICD-10-CM | POA: Diagnosis not present

## 2013-06-04 DIAGNOSIS — M9981 Other biomechanical lesions of cervical region: Secondary | ICD-10-CM | POA: Diagnosis not present

## 2013-06-04 DIAGNOSIS — M999 Biomechanical lesion, unspecified: Secondary | ICD-10-CM | POA: Diagnosis not present

## 2013-06-04 DIAGNOSIS — M47817 Spondylosis without myelopathy or radiculopathy, lumbosacral region: Secondary | ICD-10-CM | POA: Diagnosis not present

## 2013-06-06 DIAGNOSIS — M47817 Spondylosis without myelopathy or radiculopathy, lumbosacral region: Secondary | ICD-10-CM | POA: Diagnosis not present

## 2013-06-06 DIAGNOSIS — S335XXA Sprain of ligaments of lumbar spine, initial encounter: Secondary | ICD-10-CM | POA: Diagnosis not present

## 2013-06-06 DIAGNOSIS — M999 Biomechanical lesion, unspecified: Secondary | ICD-10-CM | POA: Diagnosis not present

## 2013-06-07 DIAGNOSIS — I2510 Atherosclerotic heart disease of native coronary artery without angina pectoris: Secondary | ICD-10-CM | POA: Diagnosis not present

## 2013-06-07 DIAGNOSIS — E119 Type 2 diabetes mellitus without complications: Secondary | ICD-10-CM | POA: Diagnosis not present

## 2013-06-07 DIAGNOSIS — M069 Rheumatoid arthritis, unspecified: Secondary | ICD-10-CM | POA: Diagnosis not present

## 2013-06-08 DIAGNOSIS — M47817 Spondylosis without myelopathy or radiculopathy, lumbosacral region: Secondary | ICD-10-CM | POA: Diagnosis not present

## 2013-06-08 DIAGNOSIS — M9981 Other biomechanical lesions of cervical region: Secondary | ICD-10-CM | POA: Diagnosis not present

## 2013-06-08 DIAGNOSIS — M4712 Other spondylosis with myelopathy, cervical region: Secondary | ICD-10-CM | POA: Diagnosis not present

## 2013-06-08 DIAGNOSIS — M546 Pain in thoracic spine: Secondary | ICD-10-CM | POA: Diagnosis not present

## 2013-06-08 DIAGNOSIS — M999 Biomechanical lesion, unspecified: Secondary | ICD-10-CM | POA: Diagnosis not present

## 2013-06-11 DIAGNOSIS — M9981 Other biomechanical lesions of cervical region: Secondary | ICD-10-CM | POA: Diagnosis not present

## 2013-06-11 DIAGNOSIS — M546 Pain in thoracic spine: Secondary | ICD-10-CM | POA: Diagnosis not present

## 2013-06-11 DIAGNOSIS — M47817 Spondylosis without myelopathy or radiculopathy, lumbosacral region: Secondary | ICD-10-CM | POA: Diagnosis not present

## 2013-06-11 DIAGNOSIS — M4712 Other spondylosis with myelopathy, cervical region: Secondary | ICD-10-CM | POA: Diagnosis not present

## 2013-06-11 DIAGNOSIS — M999 Biomechanical lesion, unspecified: Secondary | ICD-10-CM | POA: Diagnosis not present

## 2013-06-14 DIAGNOSIS — M999 Biomechanical lesion, unspecified: Secondary | ICD-10-CM | POA: Diagnosis not present

## 2013-06-14 DIAGNOSIS — M47817 Spondylosis without myelopathy or radiculopathy, lumbosacral region: Secondary | ICD-10-CM | POA: Diagnosis not present

## 2013-06-14 DIAGNOSIS — S335XXA Sprain of ligaments of lumbar spine, initial encounter: Secondary | ICD-10-CM | POA: Diagnosis not present

## 2013-06-18 DIAGNOSIS — M4712 Other spondylosis with myelopathy, cervical region: Secondary | ICD-10-CM | POA: Diagnosis not present

## 2013-06-18 DIAGNOSIS — M9981 Other biomechanical lesions of cervical region: Secondary | ICD-10-CM | POA: Diagnosis not present

## 2013-06-18 DIAGNOSIS — M999 Biomechanical lesion, unspecified: Secondary | ICD-10-CM | POA: Diagnosis not present

## 2013-06-18 DIAGNOSIS — M47817 Spondylosis without myelopathy or radiculopathy, lumbosacral region: Secondary | ICD-10-CM | POA: Diagnosis not present

## 2013-06-18 DIAGNOSIS — M546 Pain in thoracic spine: Secondary | ICD-10-CM | POA: Diagnosis not present

## 2013-06-19 DIAGNOSIS — M069 Rheumatoid arthritis, unspecified: Secondary | ICD-10-CM | POA: Diagnosis not present

## 2013-06-21 DIAGNOSIS — M999 Biomechanical lesion, unspecified: Secondary | ICD-10-CM | POA: Diagnosis not present

## 2013-06-21 DIAGNOSIS — I10 Essential (primary) hypertension: Secondary | ICD-10-CM | POA: Diagnosis not present

## 2013-06-21 DIAGNOSIS — S335XXA Sprain of ligaments of lumbar spine, initial encounter: Secondary | ICD-10-CM | POA: Diagnosis not present

## 2013-06-21 DIAGNOSIS — M47817 Spondylosis without myelopathy or radiculopathy, lumbosacral region: Secondary | ICD-10-CM | POA: Diagnosis not present

## 2013-06-21 DIAGNOSIS — E119 Type 2 diabetes mellitus without complications: Secondary | ICD-10-CM | POA: Diagnosis not present

## 2013-06-21 DIAGNOSIS — R634 Abnormal weight loss: Secondary | ICD-10-CM | POA: Diagnosis not present

## 2013-06-25 DIAGNOSIS — M6530 Trigger finger, unspecified finger: Secondary | ICD-10-CM | POA: Diagnosis not present

## 2013-06-25 DIAGNOSIS — M545 Low back pain, unspecified: Secondary | ICD-10-CM | POA: Diagnosis not present

## 2013-06-25 DIAGNOSIS — M159 Polyosteoarthritis, unspecified: Secondary | ICD-10-CM | POA: Diagnosis not present

## 2013-06-25 DIAGNOSIS — M069 Rheumatoid arthritis, unspecified: Secondary | ICD-10-CM | POA: Diagnosis not present

## 2013-07-02 DIAGNOSIS — M999 Biomechanical lesion, unspecified: Secondary | ICD-10-CM | POA: Diagnosis not present

## 2013-07-02 DIAGNOSIS — M546 Pain in thoracic spine: Secondary | ICD-10-CM | POA: Diagnosis not present

## 2013-07-02 DIAGNOSIS — M9981 Other biomechanical lesions of cervical region: Secondary | ICD-10-CM | POA: Diagnosis not present

## 2013-07-02 DIAGNOSIS — M47817 Spondylosis without myelopathy or radiculopathy, lumbosacral region: Secondary | ICD-10-CM | POA: Diagnosis not present

## 2013-07-02 DIAGNOSIS — M4712 Other spondylosis with myelopathy, cervical region: Secondary | ICD-10-CM | POA: Diagnosis not present

## 2013-07-09 DIAGNOSIS — M4712 Other spondylosis with myelopathy, cervical region: Secondary | ICD-10-CM | POA: Diagnosis not present

## 2013-07-09 DIAGNOSIS — M9981 Other biomechanical lesions of cervical region: Secondary | ICD-10-CM | POA: Diagnosis not present

## 2013-07-09 DIAGNOSIS — M546 Pain in thoracic spine: Secondary | ICD-10-CM | POA: Diagnosis not present

## 2013-07-09 DIAGNOSIS — M999 Biomechanical lesion, unspecified: Secondary | ICD-10-CM | POA: Diagnosis not present

## 2013-07-09 DIAGNOSIS — M47817 Spondylosis without myelopathy or radiculopathy, lumbosacral region: Secondary | ICD-10-CM | POA: Diagnosis not present

## 2013-07-12 DIAGNOSIS — M4712 Other spondylosis with myelopathy, cervical region: Secondary | ICD-10-CM | POA: Diagnosis not present

## 2013-07-12 DIAGNOSIS — S335XXA Sprain of ligaments of lumbar spine, initial encounter: Secondary | ICD-10-CM | POA: Diagnosis not present

## 2013-07-12 DIAGNOSIS — M999 Biomechanical lesion, unspecified: Secondary | ICD-10-CM | POA: Diagnosis not present

## 2013-07-12 DIAGNOSIS — M9981 Other biomechanical lesions of cervical region: Secondary | ICD-10-CM | POA: Diagnosis not present

## 2013-07-12 DIAGNOSIS — M47817 Spondylosis without myelopathy or radiculopathy, lumbosacral region: Secondary | ICD-10-CM | POA: Diagnosis not present

## 2013-07-12 DIAGNOSIS — M546 Pain in thoracic spine: Secondary | ICD-10-CM | POA: Diagnosis not present

## 2013-07-18 ENCOUNTER — Encounter (HOSPITAL_COMMUNITY): Payer: Medicare Other | Attending: Hematology and Oncology

## 2013-07-18 DIAGNOSIS — E669 Obesity, unspecified: Secondary | ICD-10-CM | POA: Insufficient documentation

## 2013-07-18 DIAGNOSIS — E876 Hypokalemia: Secondary | ICD-10-CM | POA: Diagnosis not present

## 2013-07-18 DIAGNOSIS — Z09 Encounter for follow-up examination after completed treatment for conditions other than malignant neoplasm: Secondary | ICD-10-CM | POA: Diagnosis not present

## 2013-07-18 DIAGNOSIS — I10 Essential (primary) hypertension: Secondary | ICD-10-CM | POA: Insufficient documentation

## 2013-07-18 DIAGNOSIS — Z9049 Acquired absence of other specified parts of digestive tract: Secondary | ICD-10-CM | POA: Insufficient documentation

## 2013-07-18 DIAGNOSIS — M069 Rheumatoid arthritis, unspecified: Secondary | ICD-10-CM | POA: Insufficient documentation

## 2013-07-18 DIAGNOSIS — Z85038 Personal history of other malignant neoplasm of large intestine: Secondary | ICD-10-CM

## 2013-07-18 DIAGNOSIS — Z86718 Personal history of other venous thrombosis and embolism: Secondary | ICD-10-CM | POA: Diagnosis not present

## 2013-07-18 DIAGNOSIS — C189 Malignant neoplasm of colon, unspecified: Secondary | ICD-10-CM

## 2013-07-18 DIAGNOSIS — E119 Type 2 diabetes mellitus without complications: Secondary | ICD-10-CM | POA: Diagnosis not present

## 2013-07-18 DIAGNOSIS — K224 Dyskinesia of esophagus: Secondary | ICD-10-CM | POA: Diagnosis not present

## 2013-07-18 LAB — COMPREHENSIVE METABOLIC PANEL
ALT: 9 U/L (ref 0–35)
AST: 13 U/L (ref 0–37)
Albumin: 3.2 g/dL — ABNORMAL LOW (ref 3.5–5.2)
Alkaline Phosphatase: 90 U/L (ref 39–117)
BILIRUBIN TOTAL: 1.1 mg/dL (ref 0.3–1.2)
BUN: 14 mg/dL (ref 6–23)
CHLORIDE: 102 meq/L (ref 96–112)
CO2: 33 meq/L — AB (ref 19–32)
CREATININE: 0.81 mg/dL (ref 0.50–1.10)
Calcium: 8.9 mg/dL (ref 8.4–10.5)
GFR calc non Af Amer: 70 mL/min — ABNORMAL LOW (ref >90)
GFR, EST AFRICAN AMERICAN: 81 mL/min — AB (ref >90)
GLUCOSE: 95 mg/dL (ref 70–99)
Potassium: 3.7 mEq/L (ref 3.7–5.3)
Sodium: 143 mEq/L (ref 137–147)
Total Protein: 6.9 g/dL (ref 6.0–8.3)

## 2013-07-18 LAB — CBC WITH DIFFERENTIAL/PLATELET
Basophils Absolute: 0.1 10*3/uL (ref 0.0–0.1)
Basophils Relative: 1 % (ref 0–1)
Eosinophils Absolute: 0.1 10*3/uL (ref 0.0–0.7)
Eosinophils Relative: 1 % (ref 0–5)
HEMATOCRIT: 35.4 % — AB (ref 36.0–46.0)
Hemoglobin: 12.3 g/dL (ref 12.0–15.0)
LYMPHS PCT: 39 % (ref 12–46)
Lymphs Abs: 3.6 10*3/uL (ref 0.7–4.0)
MCH: 26 pg (ref 26.0–34.0)
MCHC: 34.7 g/dL (ref 30.0–36.0)
MCV: 74.8 fL — AB (ref 78.0–100.0)
MONO ABS: 0.6 10*3/uL (ref 0.1–1.0)
MONOS PCT: 7 % (ref 3–12)
NEUTROS ABS: 4.8 10*3/uL (ref 1.7–7.7)
Neutrophils Relative %: 53 % (ref 43–77)
Platelets: 238 10*3/uL (ref 150–400)
RBC: 4.73 MIL/uL (ref 3.87–5.11)
RDW: 14 % (ref 11.5–15.5)
WBC: 9.2 10*3/uL (ref 4.0–10.5)

## 2013-07-18 NOTE — Progress Notes (Signed)
Labs drawn today for cbc/diff,cmp,cea 

## 2013-07-19 DIAGNOSIS — M4712 Other spondylosis with myelopathy, cervical region: Secondary | ICD-10-CM | POA: Diagnosis not present

## 2013-07-19 DIAGNOSIS — S335XXA Sprain of ligaments of lumbar spine, initial encounter: Secondary | ICD-10-CM | POA: Diagnosis not present

## 2013-07-19 DIAGNOSIS — M47817 Spondylosis without myelopathy or radiculopathy, lumbosacral region: Secondary | ICD-10-CM | POA: Diagnosis not present

## 2013-07-19 DIAGNOSIS — M9981 Other biomechanical lesions of cervical region: Secondary | ICD-10-CM | POA: Diagnosis not present

## 2013-07-19 DIAGNOSIS — M999 Biomechanical lesion, unspecified: Secondary | ICD-10-CM | POA: Diagnosis not present

## 2013-07-19 DIAGNOSIS — M546 Pain in thoracic spine: Secondary | ICD-10-CM | POA: Diagnosis not present

## 2013-07-19 LAB — CEA: CEA: 1.1 ng/mL (ref 0.0–5.0)

## 2013-07-24 ENCOUNTER — Encounter (HOSPITAL_COMMUNITY): Payer: Self-pay

## 2013-07-24 ENCOUNTER — Encounter (HOSPITAL_BASED_OUTPATIENT_CLINIC_OR_DEPARTMENT_OTHER): Payer: Medicare Other

## 2013-07-24 ENCOUNTER — Other Ambulatory Visit (HOSPITAL_COMMUNITY): Payer: Self-pay | Admitting: Hematology and Oncology

## 2013-07-24 VITALS — BP 173/90 | HR 68 | Temp 97.6°F | Resp 18 | Wt 205.2 lb

## 2013-07-24 DIAGNOSIS — E876 Hypokalemia: Secondary | ICD-10-CM

## 2013-07-24 DIAGNOSIS — C189 Malignant neoplasm of colon, unspecified: Secondary | ICD-10-CM | POA: Diagnosis not present

## 2013-07-24 DIAGNOSIS — Z86718 Personal history of other venous thrombosis and embolism: Secondary | ICD-10-CM

## 2013-07-24 DIAGNOSIS — I10 Essential (primary) hypertension: Secondary | ICD-10-CM

## 2013-07-24 MED ORDER — DOXEPIN HCL 25 MG PO CAPS: ORAL_CAPSULE | ORAL | Status: DC
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
## 2013-07-24 NOTE — Patient Instructions (Signed)
[facility] Discharge Instructions  RECOMMENDATIONS MADE BY THE CONSULTANT AND ANY TEST RESULTS WILL BE SENT TO YOUR REFERRING PHYSICIAN.  We will see you in 1 year and repeat labs at that time.  Thank you for choosing [facility] to provide your oncology and hematology care.  To afford each patient quality time with our providers, please arrive at least 15 minutes before your scheduled appointment time.  With your help, our goal is to use those 15 minutes to complete the necessary work-up to ensure our physicians have the information they need to help with your evaluation and healthcare recommendations.    Effective January 1st, 2014, we ask that you re-schedule your appointment with our physicians should you arrive 10 or more minutes late for your appointment.  We strive to give you quality time with our providers, and arriving late affects you and other patients whose appointments are after yours.    Again, thank you for choosing [facility].  Our hope is that these requests will decrease the amount of time that you wait before being seen by our physicians.       _____________________________________________________________  Should you have questions after your visit to [facility], please contact our office at [phone] between the hours of 8:30 a.m. and 5:00 p.m.  Voicemails left after 4:30 p.m. will not be returned until the following business day.  For prescription refill requests, have your pharmacy contact our office with your prescription refill request.

## 2013-07-26 ENCOUNTER — Telehealth (HOSPITAL_COMMUNITY): Payer: Self-pay

## 2013-07-26 DIAGNOSIS — M546 Pain in thoracic spine: Secondary | ICD-10-CM | POA: Diagnosis not present

## 2013-07-26 DIAGNOSIS — M4712 Other spondylosis with myelopathy, cervical region: Secondary | ICD-10-CM | POA: Diagnosis not present

## 2013-07-26 DIAGNOSIS — M47817 Spondylosis without myelopathy or radiculopathy, lumbosacral region: Secondary | ICD-10-CM | POA: Diagnosis not present

## 2013-07-26 DIAGNOSIS — M999 Biomechanical lesion, unspecified: Secondary | ICD-10-CM | POA: Diagnosis not present

## 2013-07-26 DIAGNOSIS — M9981 Other biomechanical lesions of cervical region: Secondary | ICD-10-CM | POA: Diagnosis not present

## 2013-07-31 DIAGNOSIS — E119 Type 2 diabetes mellitus without complications: Secondary | ICD-10-CM | POA: Diagnosis not present

## 2013-07-31 DIAGNOSIS — M069 Rheumatoid arthritis, unspecified: Secondary | ICD-10-CM | POA: Diagnosis not present

## 2013-07-31 DIAGNOSIS — M1990 Unspecified osteoarthritis, unspecified site: Secondary | ICD-10-CM | POA: Diagnosis not present

## 2013-07-31 DIAGNOSIS — I10 Essential (primary) hypertension: Secondary | ICD-10-CM | POA: Diagnosis not present

## 2013-07-31 DIAGNOSIS — E039 Hypothyroidism, unspecified: Secondary | ICD-10-CM | POA: Diagnosis not present

## 2013-08-02 DIAGNOSIS — M47817 Spondylosis without myelopathy or radiculopathy, lumbosacral region: Secondary | ICD-10-CM | POA: Diagnosis not present

## 2013-08-02 DIAGNOSIS — S335XXA Sprain of ligaments of lumbar spine, initial encounter: Secondary | ICD-10-CM | POA: Diagnosis not present

## 2013-08-02 DIAGNOSIS — M546 Pain in thoracic spine: Secondary | ICD-10-CM | POA: Diagnosis not present

## 2013-08-02 DIAGNOSIS — M9981 Other biomechanical lesions of cervical region: Secondary | ICD-10-CM | POA: Diagnosis not present

## 2013-08-02 DIAGNOSIS — M999 Biomechanical lesion, unspecified: Secondary | ICD-10-CM | POA: Diagnosis not present

## 2013-08-02 DIAGNOSIS — M4712 Other spondylosis with myelopathy, cervical region: Secondary | ICD-10-CM | POA: Diagnosis not present

## 2013-08-13 ENCOUNTER — Other Ambulatory Visit (HOSPITAL_COMMUNITY): Payer: Self-pay | Admitting: Cardiology

## 2013-08-13 DIAGNOSIS — R079 Chest pain, unspecified: Secondary | ICD-10-CM

## 2013-08-15 DIAGNOSIS — M069 Rheumatoid arthritis, unspecified: Secondary | ICD-10-CM | POA: Diagnosis not present

## 2013-08-16 ENCOUNTER — Encounter (HOSPITAL_COMMUNITY)
Admission: RE | Admit: 2013-08-16 | Discharge: 2013-08-16 | Disposition: A | Discharge status: Home / Self Care | Payer: Medicare Other | Source: Ambulatory Visit | Attending: Cardiology | Admitting: Cardiology

## 2013-08-16 ENCOUNTER — Encounter (HOSPITAL_COMMUNITY): Payer: Self-pay

## 2013-08-16 DIAGNOSIS — R079 Chest pain, unspecified: Secondary | ICD-10-CM

## 2013-08-16 MED ORDER — REGADENOSON 0.4 MG/5ML IV SOLN
INTRAVENOUS | Status: AC
  Administered 2013-08-16: 0.4 mg via INTRAVENOUS
  Filled 2013-08-16: qty 5

## 2013-08-16 MED ORDER — SODIUM CHLORIDE 0.9 % IJ SOLN
INTRAMUSCULAR | Status: AC
  Administered 2013-08-16: 10 mL via INTRAVENOUS
  Filled 2013-08-16: qty 10

## 2013-08-16 MED ORDER — TECHNETIUM TC 99M SESTAMIBI GENERIC - CARDIOLITE
10.0000 | Freq: Once | INTRAVENOUS | Status: AC | PRN
  Administered 2013-08-16: 10 via INTRAVENOUS

## 2013-08-16 MED ORDER — TECHNETIUM TC 99M SESTAMIBI - CARDIOLITE
30.0000 | Freq: Once | INTRAVENOUS | Status: AC | PRN
  Administered 2013-08-16: 30 via INTRAVENOUS

## 2013-08-16 NOTE — Progress Notes (Signed)
[facility] Nurses Notes - Lyana Asbill  SHALIMAR MCCLAIN 08/16/2013 Reason for doing test: Chest Pain Type of test: Wille Glaser Nurse performing test: Gerrit Halls, RN Nuclear Medicine Tech: Dyanne Carrel Echo Tech: Not Applicable MD performing test: Gaynelle Cage MD: [facility] explained and consent signed: yes IV started: 22g jelco, Saline lock flushed, No redness or edema and Saline lock started in radiology Symptoms: Dizziness with some discomfort in stomach & chest. Treatment/Intervention: None Reason test stopped: protocol completed After recovery IV was: Discontinued via X-ray tech and No redness or edema Patient to return to [facility]. Med at : 12:00 Patient discharged: Home Patient's Condition upon discharge was: stable Comments: During test BP 141/71 & HR 97. Recovery BP 154/73 & HR 78.  Symptoms resolved in recovery. Donnajean Lopes

## 2013-08-20 DIAGNOSIS — M9981 Other biomechanical lesions of cervical region: Secondary | ICD-10-CM | POA: Diagnosis not present

## 2013-08-20 DIAGNOSIS — M4712 Other spondylosis with myelopathy, cervical region: Secondary | ICD-10-CM | POA: Diagnosis not present

## 2013-08-20 DIAGNOSIS — M47817 Spondylosis without myelopathy or radiculopathy, lumbosacral region: Secondary | ICD-10-CM | POA: Diagnosis not present

## 2013-08-20 DIAGNOSIS — M999 Biomechanical lesion, unspecified: Secondary | ICD-10-CM | POA: Diagnosis not present

## 2013-08-20 DIAGNOSIS — M546 Pain in thoracic spine: Secondary | ICD-10-CM | POA: Diagnosis not present

## 2013-08-28 DIAGNOSIS — E119 Type 2 diabetes mellitus without complications: Secondary | ICD-10-CM | POA: Diagnosis not present

## 2013-08-28 DIAGNOSIS — M25549 Pain in joints of unspecified hand: Secondary | ICD-10-CM | POA: Diagnosis not present

## 2013-08-28 DIAGNOSIS — I10 Essential (primary) hypertension: Secondary | ICD-10-CM | POA: Diagnosis not present

## 2013-08-29 ENCOUNTER — Other Ambulatory Visit (HOSPITAL_COMMUNITY): Payer: Self-pay | Admitting: Family Medicine

## 2013-08-29 ENCOUNTER — Ambulatory Visit (HOSPITAL_COMMUNITY)
Admission: RE | Admit: 2013-08-29 | Discharge: 2013-08-29 | Disposition: A | Discharge status: Home / Self Care | Payer: Medicare Other | Source: Ambulatory Visit | Attending: Family Medicine | Admitting: Family Medicine

## 2013-08-29 DIAGNOSIS — M19039 Primary osteoarthritis, unspecified wrist: Secondary | ICD-10-CM | POA: Diagnosis not present

## 2013-08-29 DIAGNOSIS — W19XXXA Unspecified fall, initial encounter: Secondary | ICD-10-CM

## 2013-08-29 DIAGNOSIS — M25532 Pain in left wrist: Secondary | ICD-10-CM

## 2013-08-29 DIAGNOSIS — M25539 Pain in unspecified wrist: Secondary | ICD-10-CM | POA: Diagnosis not present

## 2013-09-06 DIAGNOSIS — M545 Low back pain, unspecified: Secondary | ICD-10-CM | POA: Diagnosis not present

## 2013-09-06 DIAGNOSIS — M79609 Pain in unspecified limb: Secondary | ICD-10-CM | POA: Diagnosis not present

## 2013-09-06 DIAGNOSIS — M159 Polyosteoarthritis, unspecified: Secondary | ICD-10-CM | POA: Diagnosis not present

## 2013-09-06 DIAGNOSIS — M069 Rheumatoid arthritis, unspecified: Secondary | ICD-10-CM | POA: Diagnosis not present

## 2013-09-06 DIAGNOSIS — M6530 Trigger finger, unspecified finger: Secondary | ICD-10-CM | POA: Diagnosis not present

## 2013-09-25 DIAGNOSIS — M545 Low back pain, unspecified: Secondary | ICD-10-CM | POA: Diagnosis not present

## 2013-09-25 DIAGNOSIS — M6530 Trigger finger, unspecified finger: Secondary | ICD-10-CM | POA: Diagnosis not present

## 2013-09-25 DIAGNOSIS — M159 Polyosteoarthritis, unspecified: Secondary | ICD-10-CM | POA: Diagnosis not present

## 2013-09-25 DIAGNOSIS — M069 Rheumatoid arthritis, unspecified: Secondary | ICD-10-CM | POA: Diagnosis not present

## 2013-10-04 DIAGNOSIS — S2000XA Contusion of breast, unspecified breast, initial encounter: Secondary | ICD-10-CM | POA: Diagnosis not present

## 2013-10-04 DIAGNOSIS — J309 Allergic rhinitis, unspecified: Secondary | ICD-10-CM | POA: Diagnosis not present

## 2013-10-04 DIAGNOSIS — E119 Type 2 diabetes mellitus without complications: Secondary | ICD-10-CM | POA: Diagnosis not present

## 2013-10-09 DIAGNOSIS — Z961 Presence of intraocular lens: Secondary | ICD-10-CM | POA: Diagnosis not present

## 2013-10-09 DIAGNOSIS — H04129 Dry eye syndrome of unspecified lacrimal gland: Secondary | ICD-10-CM | POA: Diagnosis not present

## 2013-10-09 DIAGNOSIS — E119 Type 2 diabetes mellitus without complications: Secondary | ICD-10-CM | POA: Diagnosis not present

## 2013-10-09 DIAGNOSIS — H1045 Other chronic allergic conjunctivitis: Secondary | ICD-10-CM | POA: Diagnosis not present

## 2013-10-11 DIAGNOSIS — M069 Rheumatoid arthritis, unspecified: Secondary | ICD-10-CM | POA: Diagnosis not present

## 2013-10-16 DIAGNOSIS — Z85038 Personal history of other malignant neoplasm of large intestine: Secondary | ICD-10-CM | POA: Diagnosis not present

## 2013-10-16 DIAGNOSIS — Z8371 Family history of colonic polyps: Secondary | ICD-10-CM | POA: Diagnosis not present

## 2013-10-16 DIAGNOSIS — R141 Gas pain: Secondary | ICD-10-CM | POA: Diagnosis not present

## 2013-10-16 DIAGNOSIS — R143 Flatulence: Secondary | ICD-10-CM | POA: Diagnosis not present

## 2013-10-16 DIAGNOSIS — Z1211 Encounter for screening for malignant neoplasm of colon: Secondary | ICD-10-CM | POA: Diagnosis not present

## 2013-10-16 DIAGNOSIS — E669 Obesity, unspecified: Secondary | ICD-10-CM | POA: Diagnosis not present

## 2013-11-13 DIAGNOSIS — Z8371 Family history of colonic polyps: Secondary | ICD-10-CM | POA: Diagnosis not present

## 2013-11-13 DIAGNOSIS — K620 Anal polyp: Secondary | ICD-10-CM | POA: Diagnosis not present

## 2013-11-13 DIAGNOSIS — Z85038 Personal history of other malignant neoplasm of large intestine: Secondary | ICD-10-CM | POA: Diagnosis not present

## 2013-11-13 DIAGNOSIS — Z1211 Encounter for screening for malignant neoplasm of colon: Secondary | ICD-10-CM | POA: Diagnosis not present

## 2013-11-13 DIAGNOSIS — K621 Rectal polyp: Secondary | ICD-10-CM | POA: Diagnosis not present

## 2013-11-13 DIAGNOSIS — D126 Benign neoplasm of colon, unspecified: Secondary | ICD-10-CM | POA: Diagnosis not present

## 2013-12-06 DIAGNOSIS — M9981 Other biomechanical lesions of cervical region: Secondary | ICD-10-CM | POA: Diagnosis not present

## 2013-12-06 DIAGNOSIS — M069 Rheumatoid arthritis, unspecified: Secondary | ICD-10-CM | POA: Diagnosis not present

## 2013-12-06 DIAGNOSIS — M546 Pain in thoracic spine: Secondary | ICD-10-CM | POA: Diagnosis not present

## 2013-12-06 DIAGNOSIS — M4712 Other spondylosis with myelopathy, cervical region: Secondary | ICD-10-CM | POA: Diagnosis not present

## 2013-12-06 DIAGNOSIS — M999 Biomechanical lesion, unspecified: Secondary | ICD-10-CM | POA: Diagnosis not present

## 2013-12-06 DIAGNOSIS — M47817 Spondylosis without myelopathy or radiculopathy, lumbosacral region: Secondary | ICD-10-CM | POA: Diagnosis not present

## 2013-12-07 DIAGNOSIS — M999 Biomechanical lesion, unspecified: Secondary | ICD-10-CM | POA: Diagnosis not present

## 2013-12-07 DIAGNOSIS — M4712 Other spondylosis with myelopathy, cervical region: Secondary | ICD-10-CM | POA: Diagnosis not present

## 2013-12-07 DIAGNOSIS — M9981 Other biomechanical lesions of cervical region: Secondary | ICD-10-CM | POA: Diagnosis not present

## 2013-12-07 DIAGNOSIS — M47817 Spondylosis without myelopathy or radiculopathy, lumbosacral region: Secondary | ICD-10-CM | POA: Diagnosis not present

## 2013-12-07 DIAGNOSIS — M546 Pain in thoracic spine: Secondary | ICD-10-CM | POA: Diagnosis not present

## 2013-12-10 DIAGNOSIS — M9981 Other biomechanical lesions of cervical region: Secondary | ICD-10-CM | POA: Diagnosis not present

## 2013-12-10 DIAGNOSIS — M4712 Other spondylosis with myelopathy, cervical region: Secondary | ICD-10-CM | POA: Diagnosis not present

## 2013-12-10 DIAGNOSIS — M546 Pain in thoracic spine: Secondary | ICD-10-CM | POA: Diagnosis not present

## 2013-12-10 DIAGNOSIS — M999 Biomechanical lesion, unspecified: Secondary | ICD-10-CM | POA: Diagnosis not present

## 2013-12-10 DIAGNOSIS — M47817 Spondylosis without myelopathy or radiculopathy, lumbosacral region: Secondary | ICD-10-CM | POA: Diagnosis not present

## 2013-12-11 ENCOUNTER — Encounter (HOSPITAL_COMMUNITY): Payer: Self-pay | Admitting: Emergency Medicine

## 2013-12-11 ENCOUNTER — Emergency Department (HOSPITAL_COMMUNITY): Payer: Medicare Other

## 2013-12-11 ENCOUNTER — Emergency Department (HOSPITAL_COMMUNITY)
Admission: EM | Admit: 2013-12-11 | Discharge: 2013-12-11 | Disposition: A | Discharge status: Home / Self Care | Payer: Medicare Other | Attending: Emergency Medicine | Admitting: Emergency Medicine

## 2013-12-11 DIAGNOSIS — Z86718 Personal history of other venous thrombosis and embolism: Secondary | ICD-10-CM | POA: Diagnosis not present

## 2013-12-11 DIAGNOSIS — M069 Rheumatoid arthritis, unspecified: Secondary | ICD-10-CM | POA: Insufficient documentation

## 2013-12-11 DIAGNOSIS — S43499A Other sprain of unspecified shoulder joint, initial encounter: Secondary | ICD-10-CM | POA: Insufficient documentation

## 2013-12-11 DIAGNOSIS — M545 Low back pain, unspecified: Secondary | ICD-10-CM | POA: Diagnosis not present

## 2013-12-11 DIAGNOSIS — Z7982 Long term (current) use of aspirin: Secondary | ICD-10-CM | POA: Insufficient documentation

## 2013-12-11 DIAGNOSIS — S46909A Unspecified injury of unspecified muscle, fascia and tendon at shoulder and upper arm level, unspecified arm, initial encounter: Secondary | ICD-10-CM | POA: Insufficient documentation

## 2013-12-11 DIAGNOSIS — IMO0002 Reserved for concepts with insufficient information to code with codable children: Secondary | ICD-10-CM | POA: Diagnosis not present

## 2013-12-11 DIAGNOSIS — E119 Type 2 diabetes mellitus without complications: Secondary | ICD-10-CM | POA: Diagnosis not present

## 2013-12-11 DIAGNOSIS — S0993XA Unspecified injury of face, initial encounter: Secondary | ICD-10-CM | POA: Diagnosis not present

## 2013-12-11 DIAGNOSIS — Y9389 Activity, other specified: Secondary | ICD-10-CM | POA: Insufficient documentation

## 2013-12-11 DIAGNOSIS — S46819A Strain of other muscles, fascia and tendons at shoulder and upper arm level, unspecified arm, initial encounter: Secondary | ICD-10-CM

## 2013-12-11 DIAGNOSIS — E669 Obesity, unspecified: Secondary | ICD-10-CM | POA: Insufficient documentation

## 2013-12-11 DIAGNOSIS — Z79899 Other long term (current) drug therapy: Secondary | ICD-10-CM | POA: Insufficient documentation

## 2013-12-11 DIAGNOSIS — Z85038 Personal history of other malignant neoplasm of large intestine: Secondary | ICD-10-CM | POA: Insufficient documentation

## 2013-12-11 DIAGNOSIS — S4980XA Other specified injuries of shoulder and upper arm, unspecified arm, initial encounter: Secondary | ICD-10-CM | POA: Diagnosis present

## 2013-12-11 DIAGNOSIS — M542 Cervicalgia: Secondary | ICD-10-CM | POA: Diagnosis not present

## 2013-12-11 DIAGNOSIS — Y92410 Unspecified street and highway as the place of occurrence of the external cause: Secondary | ICD-10-CM | POA: Diagnosis not present

## 2013-12-11 MED ORDER — CYCLOBENZAPRINE HCL 5 MG PO TABS: 5.0000 mg | ORAL_TABLET | Freq: Three times a day (TID) | ORAL | Status: DC | PRN

## 2013-12-11 MED ORDER — NAPROXEN 250 MG PO TABS
250.0000 mg | ORAL_TABLET | Freq: Once | ORAL | Status: AC
  Administered 2013-12-11: 250 mg via ORAL
  Filled 2013-12-11: qty 1

## 2013-12-11 MED ORDER — TRAMADOL HCL 50 MG PO TABS: 100.0000 mg | ORAL_TABLET | Freq: Four times a day (QID) | ORAL | Status: DC | PRN

## 2013-12-11 MED ORDER — CYCLOBENZAPRINE HCL 10 MG PO TABS
10.0000 mg | ORAL_TABLET | Freq: Once | ORAL | Status: AC
  Administered 2013-12-11: 10 mg via ORAL
  Filled 2013-12-11: qty 1

## 2013-12-11 MED ORDER — NAPROXEN 250 MG PO TABS: 250.0000 mg | ORAL_TABLET | Freq: Two times a day (BID) | ORAL | Status: DC
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
## 2013-12-11 NOTE — Discharge Instructions (Signed)
Ice packs to the injured areas, heat will relax the sore muscles. Take the medications as prescribed. You can be rechecked if you continue to have pain after a week or if your pain gets worse. You can call Dr Sammuel Hines office to get an appointment. Return to the ED for any problems listed on the head injury sheet.
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
## 2013-12-13 DIAGNOSIS — M542 Cervicalgia: Secondary | ICD-10-CM | POA: Diagnosis not present

## 2013-12-13 DIAGNOSIS — M25519 Pain in unspecified shoulder: Secondary | ICD-10-CM | POA: Diagnosis not present

## 2013-12-13 DIAGNOSIS — E119 Type 2 diabetes mellitus without complications: Secondary | ICD-10-CM | POA: Diagnosis not present

## 2013-12-14 DIAGNOSIS — M546 Pain in thoracic spine: Secondary | ICD-10-CM | POA: Diagnosis not present

## 2013-12-14 DIAGNOSIS — M47817 Spondylosis without myelopathy or radiculopathy, lumbosacral region: Secondary | ICD-10-CM | POA: Diagnosis not present

## 2013-12-14 DIAGNOSIS — M999 Biomechanical lesion, unspecified: Secondary | ICD-10-CM | POA: Diagnosis not present

## 2013-12-14 DIAGNOSIS — M4712 Other spondylosis with myelopathy, cervical region: Secondary | ICD-10-CM | POA: Diagnosis not present

## 2013-12-14 DIAGNOSIS — M9981 Other biomechanical lesions of cervical region: Secondary | ICD-10-CM | POA: Diagnosis not present

## 2013-12-25 ENCOUNTER — Other Ambulatory Visit (HOSPITAL_COMMUNITY): Payer: Self-pay | Admitting: Cardiology

## 2013-12-25 DIAGNOSIS — R55 Syncope and collapse: Secondary | ICD-10-CM

## 2013-12-25 DIAGNOSIS — R0989 Other specified symptoms and signs involving the circulatory and respiratory systems: Secondary | ICD-10-CM

## 2013-12-27 DIAGNOSIS — M159 Polyosteoarthritis, unspecified: Secondary | ICD-10-CM | POA: Diagnosis not present

## 2013-12-27 DIAGNOSIS — M545 Low back pain, unspecified: Secondary | ICD-10-CM | POA: Diagnosis not present

## 2013-12-27 DIAGNOSIS — M069 Rheumatoid arthritis, unspecified: Secondary | ICD-10-CM | POA: Diagnosis not present

## 2013-12-27 DIAGNOSIS — M6530 Trigger finger, unspecified finger: Secondary | ICD-10-CM | POA: Diagnosis not present

## 2013-12-27 DIAGNOSIS — M25539 Pain in unspecified wrist: Secondary | ICD-10-CM | POA: Diagnosis not present

## 2013-12-31 ENCOUNTER — Ambulatory Visit (HOSPITAL_COMMUNITY)
Admission: RE | Admit: 2013-12-31 | Discharge: 2013-12-31 | Disposition: A | Discharge status: Home / Self Care | Payer: Medicare Other | Source: Ambulatory Visit | Attending: Cardiology | Admitting: Cardiology

## 2013-12-31 ENCOUNTER — Other Ambulatory Visit: Payer: Self-pay

## 2013-12-31 DIAGNOSIS — Z9889 Other specified postprocedural states: Secondary | ICD-10-CM | POA: Insufficient documentation

## 2013-12-31 DIAGNOSIS — R0989 Other specified symptoms and signs involving the circulatory and respiratory systems: Secondary | ICD-10-CM

## 2013-12-31 DIAGNOSIS — R42 Dizziness and giddiness: Secondary | ICD-10-CM | POA: Insufficient documentation

## 2013-12-31 DIAGNOSIS — R55 Syncope and collapse: Secondary | ICD-10-CM | POA: Insufficient documentation

## 2013-12-31 DIAGNOSIS — R079 Chest pain, unspecified: Secondary | ICD-10-CM

## 2013-12-31 NOTE — Progress Notes (Signed)
48 hour Holter in progress.

## 2014-01-01 DIAGNOSIS — M542 Cervicalgia: Secondary | ICD-10-CM | POA: Diagnosis not present

## 2014-01-01 DIAGNOSIS — E119 Type 2 diabetes mellitus without complications: Secondary | ICD-10-CM | POA: Diagnosis not present

## 2014-01-02 ENCOUNTER — Ambulatory Visit (HOSPITAL_COMMUNITY): Payer: Medicare Other

## 2014-01-08 ENCOUNTER — Other Ambulatory Visit: Payer: Self-pay | Admitting: *Deleted

## 2014-01-08 DIAGNOSIS — R55 Syncope and collapse: Secondary | ICD-10-CM

## 2014-01-17 DIAGNOSIS — E119 Type 2 diabetes mellitus without complications: Secondary | ICD-10-CM | POA: Diagnosis not present

## 2014-01-17 DIAGNOSIS — M542 Cervicalgia: Secondary | ICD-10-CM | POA: Diagnosis not present

## 2014-01-25 DIAGNOSIS — Z23 Encounter for immunization: Secondary | ICD-10-CM | POA: Diagnosis not present

## 2014-01-29 DIAGNOSIS — M542 Cervicalgia: Secondary | ICD-10-CM | POA: Diagnosis not present

## 2014-01-29 DIAGNOSIS — E119 Type 2 diabetes mellitus without complications: Secondary | ICD-10-CM | POA: Diagnosis not present

## 2014-01-29 DIAGNOSIS — S43499A Other sprain of unspecified shoulder joint, initial encounter: Secondary | ICD-10-CM | POA: Diagnosis not present

## 2014-01-31 DIAGNOSIS — M069 Rheumatoid arthritis, unspecified: Secondary | ICD-10-CM | POA: Diagnosis not present

## 2014-02-01 DIAGNOSIS — M4712 Other spondylosis with myelopathy, cervical region: Secondary | ICD-10-CM | POA: Diagnosis not present

## 2014-02-01 DIAGNOSIS — M546 Pain in thoracic spine: Secondary | ICD-10-CM | POA: Diagnosis not present

## 2014-02-01 DIAGNOSIS — M47817 Spondylosis without myelopathy or radiculopathy, lumbosacral region: Secondary | ICD-10-CM | POA: Diagnosis not present

## 2014-02-01 DIAGNOSIS — M999 Biomechanical lesion, unspecified: Secondary | ICD-10-CM | POA: Diagnosis not present

## 2014-02-01 DIAGNOSIS — M9981 Other biomechanical lesions of cervical region: Secondary | ICD-10-CM | POA: Diagnosis not present

## 2014-02-12 ENCOUNTER — Ambulatory Visit (HOSPITAL_COMMUNITY)
Admission: RE | Admit: 2014-02-12 | Discharge: 2014-02-12 | Disposition: A | Discharge status: Home / Self Care | Payer: Medicare Other | Source: Ambulatory Visit | Attending: Cardiology | Admitting: Cardiology

## 2014-02-12 DIAGNOSIS — Z85038 Personal history of other malignant neoplasm of large intestine: Secondary | ICD-10-CM | POA: Diagnosis not present

## 2014-02-12 DIAGNOSIS — R55 Syncope and collapse: Secondary | ICD-10-CM | POA: Diagnosis not present

## 2014-02-12 DIAGNOSIS — Z9221 Personal history of antineoplastic chemotherapy: Secondary | ICD-10-CM | POA: Insufficient documentation

## 2014-02-12 DIAGNOSIS — Z6834 Body mass index (BMI) 34.0-34.9, adult: Secondary | ICD-10-CM | POA: Diagnosis not present

## 2014-02-12 DIAGNOSIS — I340 Nonrheumatic mitral (valve) insufficiency: Secondary | ICD-10-CM | POA: Diagnosis not present

## 2014-02-12 DIAGNOSIS — E119 Type 2 diabetes mellitus without complications: Secondary | ICD-10-CM | POA: Insufficient documentation

## 2014-02-12 DIAGNOSIS — E669 Obesity, unspecified: Secondary | ICD-10-CM | POA: Insufficient documentation

## 2014-02-12 DIAGNOSIS — I517 Cardiomegaly: Secondary | ICD-10-CM | POA: Diagnosis not present

## 2014-02-12 NOTE — Progress Notes (Signed)
  Echocardiogram 2D Echocardiogram has been performed.  Council, Melissa Reynolds 02/12/2014, 11:15 AM

## 2014-02-14 ENCOUNTER — Encounter: Payer: Self-pay | Admitting: Neurology

## 2014-02-14 ENCOUNTER — Ambulatory Visit (INDEPENDENT_AMBULATORY_CARE_PROVIDER_SITE_OTHER): Payer: Medicare Other | Admitting: Neurology

## 2014-02-14 ENCOUNTER — Encounter (INDEPENDENT_AMBULATORY_CARE_PROVIDER_SITE_OTHER): Payer: Self-pay

## 2014-02-14 VITALS — BP 149/87 | HR 73 | Temp 96.6°F | Resp 14 | Ht 64.5 in | Wt 205.0 lb

## 2014-02-14 DIAGNOSIS — C189 Malignant neoplasm of colon, unspecified: Secondary | ICD-10-CM | POA: Diagnosis not present

## 2014-02-14 DIAGNOSIS — R55 Syncope and collapse: Secondary | ICD-10-CM | POA: Diagnosis not present

## 2014-02-14 DIAGNOSIS — I82409 Acute embolism and thrombosis of unspecified deep veins of unspecified lower extremity: Secondary | ICD-10-CM | POA: Diagnosis not present
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
## 2014-02-14 NOTE — Patient Instructions (Signed)
Please remember, common syncope triggers are: Sleep deprivation, dehydration, overheating, stress, hypoglycemia or skipping meals, certain medications or excessive alcohol use. Avoid these triggers as much as you can.   We will do a brain MRI and a brain wave electrical test, called EEG

## 2014-02-18 ENCOUNTER — Ambulatory Visit (INDEPENDENT_AMBULATORY_CARE_PROVIDER_SITE_OTHER): Payer: Medicare Other

## 2014-02-18 DIAGNOSIS — R55 Syncope and collapse: Secondary | ICD-10-CM

## 2014-02-18 DIAGNOSIS — I82409 Acute embolism and thrombosis of unspecified deep veins of unspecified lower extremity: Secondary | ICD-10-CM

## 2014-02-18 DIAGNOSIS — C189 Malignant neoplasm of colon, unspecified: Secondary | ICD-10-CM

## 2014-02-18 NOTE — Procedures (Signed)
    History:  Melissa Reynolds is a 73 year old patient with a history of 2 recent events associated with syncope. The patient fell with these episodes, once in the yard, and once inside the house. The patient is being evaluated for possible seizures.  This is a routine EEG. No skull defects are noted. Medications include Norvasc, aspirin, Flexeril, Sinequan, folic acid, golimunab, isosorbide mononitrate, hydrocodone, Arava, losartan, hydrochlorothiazide, potassium, and Ultram.   EEG classification: Normal awake and drowsy  Description of the recording: The background rhythms of this recording consists of a fairly well modulated medium amplitude alpha rhythm of 8 Hz that is reactive to eye opening and closure. As the record progresses, the patient appears to remain in the waking state throughout the recording. Photic stimulation was performed, resulting in a bilateral and symmetric photic driving response. Hyperventilation was also performed, resulting in a minimal buildup of the background rhythm activities without significant slowing seen. Toward the end of the recording, the patient enters the drowsy state with slight symmetric slowing seen. The patient never enters stage II sleep. At no time during the recording does there appear to be evidence of spike or spike wave discharges or evidence of focal slowing. EKG monitor shows no evidence of cardiac rhythm abnormalities with a heart rate of 66.  Impression: This is a normal EEG recording in the waking and drowsy state. No evidence of ictal or interictal discharges are seen.

## 2014-02-18 NOTE — Progress Notes (Signed)
Quick Note:  Please call and advise the patient that the EEG or brain wave test we performed was reported as normal in the awake and drowsy states. We checked for abnormal electrical discharges in the brain waves and the report suggested normal findings. No further action is required on this test at this time. Please remind patient to keep any upcoming appointments or tests and to call us with any interim questions, concerns, problems or updates. Thanks,  Naryah Clenney, MD, PhD    ______ 

## 2014-03-01 DIAGNOSIS — M47812 Spondylosis without myelopathy or radiculopathy, cervical region: Secondary | ICD-10-CM | POA: Diagnosis not present

## 2014-03-01 DIAGNOSIS — M9901 Segmental and somatic dysfunction of cervical region: Secondary | ICD-10-CM | POA: Diagnosis not present

## 2014-03-01 DIAGNOSIS — M4004 Postural kyphosis, thoracic region: Secondary | ICD-10-CM | POA: Diagnosis not present

## 2014-03-01 DIAGNOSIS — M9903 Segmental and somatic dysfunction of lumbar region: Secondary | ICD-10-CM | POA: Diagnosis not present

## 2014-03-01 DIAGNOSIS — M47816 Spondylosis without myelopathy or radiculopathy, lumbar region: Secondary | ICD-10-CM | POA: Diagnosis not present

## 2014-03-01 DIAGNOSIS — M9902 Segmental and somatic dysfunction of thoracic region: Secondary | ICD-10-CM | POA: Diagnosis not present

## 2014-03-04 ENCOUNTER — Ambulatory Visit
Admission: RE | Admit: 2014-03-04 | Discharge: 2014-03-04 | Disposition: A | Discharge status: Home / Self Care | Payer: Medicare Other | Source: Ambulatory Visit | Attending: Neurology | Admitting: Neurology

## 2014-03-04 DIAGNOSIS — R55 Syncope and collapse: Secondary | ICD-10-CM

## 2014-03-04 DIAGNOSIS — I82409 Acute embolism and thrombosis of unspecified deep veins of unspecified lower extremity: Secondary | ICD-10-CM

## 2014-03-04 DIAGNOSIS — C189 Malignant neoplasm of colon, unspecified: Secondary | ICD-10-CM

## 2014-03-04 MED ORDER — GADOBENATE DIMEGLUMINE 529 MG/ML IV SOLN
19.0000 mL | Freq: Once | INTRAVENOUS | Status: AC | PRN
  Administered 2014-03-04: 19 mL via INTRAVENOUS

## 2014-03-04 MED ORDER — GADOBENATE DIMEGLUMINE 529 MG/ML IV SOLN: 20.0000 mL | Freq: Once | INTRAVENOUS | Status: DC | PRN

## 2014-03-05 NOTE — Progress Notes (Signed)
Quick Note:  Please call patient regarding the recent brain MRI: The brain scan showed a normal structure of the brain and mostly age-appropriate findings; there were changes in the deeper structures of the brain, which we call white matter changes or microvascular changes. These were reported as mild in Her case. These are tiny white spots, that occur with time and are seen in a variety of conditions, including with normal aging, chronic hypertension, chronic headaches, especially migraine HAs, chronic diabetes, chronic hyperlipidemia. These are not strokes and no mass or lesion or contrast enhancement was seen which is reassuring. Again, there were no acute findings, such as a stroke, or mass. No further action is required on this test at this time, other than re-enforcing the importance of good blood pressure control, good cholesterol control, good blood sugar control, and weight management. Please remind patient to keep any upcoming appointments or tests and to call us with any interim questions, concerns, problems or updates. Thanks,  Star Age, MD, PhD    ______

## 2014-03-07 NOTE — Progress Notes (Signed)
Quick Note:  Shared MRI Brain with patient, she verbalized understanding ______

## 2014-03-26 ENCOUNTER — Encounter: Payer: Self-pay | Admitting: Neurology

## 2014-03-28 DIAGNOSIS — M0589 Other rheumatoid arthritis with rheumatoid factor of multiple sites: Secondary | ICD-10-CM | POA: Diagnosis not present

## 2014-05-15 ENCOUNTER — Other Ambulatory Visit (HOSPITAL_COMMUNITY): Payer: Self-pay | Admitting: Respiratory Therapy

## 2014-05-15 DIAGNOSIS — G4730 Sleep apnea, unspecified: Secondary | ICD-10-CM

## 2014-05-16 DIAGNOSIS — M9901 Segmental and somatic dysfunction of cervical region: Secondary | ICD-10-CM | POA: Diagnosis not present

## 2014-05-16 DIAGNOSIS — M9902 Segmental and somatic dysfunction of thoracic region: Secondary | ICD-10-CM | POA: Diagnosis not present

## 2014-05-16 DIAGNOSIS — M9903 Segmental and somatic dysfunction of lumbar region: Secondary | ICD-10-CM | POA: Diagnosis not present

## 2014-05-16 DIAGNOSIS — M4004 Postural kyphosis, thoracic region: Secondary | ICD-10-CM | POA: Diagnosis not present

## 2014-05-16 DIAGNOSIS — M47812 Spondylosis without myelopathy or radiculopathy, cervical region: Secondary | ICD-10-CM | POA: Diagnosis not present

## 2014-05-16 DIAGNOSIS — M47816 Spondylosis without myelopathy or radiculopathy, lumbar region: Secondary | ICD-10-CM | POA: Diagnosis not present

## 2014-05-17 ENCOUNTER — Ambulatory Visit: Payer: BC Managed Care – PPO | Admitting: Nurse Practitioner

## 2014-05-21 DIAGNOSIS — E118 Type 2 diabetes mellitus with unspecified complications: Secondary | ICD-10-CM | POA: Diagnosis not present

## 2014-05-21 DIAGNOSIS — I10 Essential (primary) hypertension: Secondary | ICD-10-CM | POA: Diagnosis not present

## 2014-05-21 DIAGNOSIS — M0589 Other rheumatoid arthritis with rheumatoid factor of multiple sites: Secondary | ICD-10-CM | POA: Diagnosis not present

## 2014-05-27 ENCOUNTER — Other Ambulatory Visit (HOSPITAL_COMMUNITY): Payer: Self-pay | Admitting: Respiratory Therapy

## 2014-05-27 ENCOUNTER — Ambulatory Visit: Payer: BC Managed Care – PPO | Admitting: Neurology

## 2014-05-27 DIAGNOSIS — G4730 Sleep apnea, unspecified: Secondary | ICD-10-CM

## 2014-06-05 ENCOUNTER — Ambulatory Visit: Payer: Medicare Other | Attending: Cardiology | Admitting: Sleep Medicine

## 2014-06-05 DIAGNOSIS — G4733 Obstructive sleep apnea (adult) (pediatric): Secondary | ICD-10-CM | POA: Diagnosis not present

## 2014-06-05 DIAGNOSIS — R0683 Snoring: Secondary | ICD-10-CM | POA: Diagnosis present

## 2014-06-05 DIAGNOSIS — G4730 Sleep apnea, unspecified: Secondary | ICD-10-CM

## 2014-06-08 NOTE — Sleep Study (Signed)
  [place] A. Merlene Laughter, MD     [email or web address]        NOCTURNAL POLYSOMNOGRAM    LOCATION: SLEEP LAB FACILITY: [facility]   PHYSICIAN: Alphonsine Minium A. Merlene Laughter, M.D.   DATE OF STUDY: 06/05/2014.   REFERRING PHYSICIAN: Wallene Huh.   INDICATIONS: This is a 74 year old lady who presents with snoring, awakening with choking and restlessness while sleeping.  MEDICATIONS:  Prior to Admission medications   Medication Sig Start Date End Date Taking? Authorizing Provider  amLODipine (NORVASC) 5 MG tablet Take 5 mg by mouth daily.    Historical Provider, MD  aspirin 81 MG tablet Take 81 mg by mouth daily.    Historical Provider, MD  cyclobenzaprine (FLEXERIL) 5 MG tablet Take 1 tablet (5 mg total) by mouth 3 (three) times daily as needed for muscle spasms. 12/11/13   Janice Norrie, MD  doxepin (SINEQUAN) 25 MG capsule Take 1 or 2 capsules at bedtime. 07/24/13   Farrel Gobble, MD  folic acid (FOLVITE) 1 MG tablet Take 1 mg by mouth daily.    Historical Provider, MD  golimumab (SIMPONI ARIA) 50 MG/4ML SOLN Inject 50 mg into the vein every 8 (eight) weeks.    Historical Provider, MD  HYDROcodone-acetaminophen (VICODIN) 5-500 MG per tablet Take 1 tablet by mouth every 6 (six) hours as needed.    Historical Provider, MD  isosorbide mononitrate (IMDUR) 30 MG 24 hr tablet Take 30 mg by mouth daily. 04/28/13   Historical Provider, MD  leflunomide (ARAVA) 20 MG tablet Take 20 mg by mouth daily.    Historical Provider, MD  losartan-hydrochlorothiazide (HYZAAR) 100-25 MG per tablet Take 1 tablet by mouth every other day.     Historical Provider, MD  metFORMIN (GLUCOPHAGE) 500 MG tablet Take 500 mg by mouth 2 (two) times daily with a meal.    Historical Provider, MD  potassium chloride (K-DUR) 10 MEQ tablet Take 1 tablet (10 mEq total) by mouth 3 (three) times daily. 08/15/12   Pieter Partridge, MD  predniSONE (DELTASONE) 5 MG tablet Take 5 mg by mouth every other day.    Historical  Provider, MD  traMADol (ULTRAM) 50 MG tablet Take 2 tablets (100 mg total) by mouth every 6 (six) hours as needed. 12/11/13   Janice Norrie, MD      EPWORTH SLEEPINESS SCALE: 9.   BMI: 33.   ARCHITECTURAL SUMMARY: Total recording time was 436 minutes. Sleep efficiency 91 %. Sleep latency 10 minutes. REM latency 113 minutes. Stage NI 5 %, N2 76 % and N3 0.3 % and REM sleep 18 %.    RESPIRATORY DATA:  Baseline oxygen saturation is 98 %. The lowest saturation is 82 %. The diagnostic AHI is 5. The RDI is 5. The REM AHI is 20.  LIMB MOVEMENT SUMMARY: PLM index 1.   ELECTROCARDIOGRAM SUMMARY: Average heart rate is 65 with no significant dysrhythmias observed.   IMPRESSION:  1. 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   Thanks for this referral.  Trelon Plush A. Merlene Laughter, M.D. Diplomat, Tax adviser of Sleep Medicine.

## 2014-06-27 DIAGNOSIS — M0589 Other rheumatoid arthritis with rheumatoid factor of multiple sites: Secondary | ICD-10-CM | POA: Diagnosis not present

## 2014-06-27 DIAGNOSIS — M150 Primary generalized (osteo)arthritis: Secondary | ICD-10-CM | POA: Diagnosis not present

## 2014-06-27 DIAGNOSIS — M5136 Other intervertebral disc degeneration, lumbar region: Secondary | ICD-10-CM | POA: Diagnosis not present

## 2014-07-04 ENCOUNTER — Encounter (HOSPITAL_BASED_OUTPATIENT_CLINIC_OR_DEPARTMENT_OTHER): Payer: Medicare Other

## 2014-07-15 ENCOUNTER — Ambulatory Visit: Payer: BC Managed Care – PPO | Admitting: Neurology

## 2014-07-16 DIAGNOSIS — M0589 Other rheumatoid arthritis with rheumatoid factor of multiple sites: Secondary | ICD-10-CM | POA: Diagnosis not present

## 2014-07-18 ENCOUNTER — Other Ambulatory Visit (HOSPITAL_COMMUNITY): Payer: Self-pay

## 2014-07-18 DIAGNOSIS — C189 Malignant neoplasm of colon, unspecified: Secondary | ICD-10-CM

## 2014-07-22 ENCOUNTER — Other Ambulatory Visit (HOSPITAL_COMMUNITY): Payer: Medicare Other

## 2014-07-22 ENCOUNTER — Encounter (HOSPITAL_COMMUNITY): Payer: Medicare Other | Attending: Hematology & Oncology

## 2014-07-22 ENCOUNTER — Other Ambulatory Visit (HOSPITAL_COMMUNITY): Payer: Self-pay | Admitting: Oncology

## 2014-07-22 ENCOUNTER — Telehealth (HOSPITAL_COMMUNITY): Payer: Self-pay | Admitting: *Deleted

## 2014-07-22 DIAGNOSIS — C189 Malignant neoplasm of colon, unspecified: Secondary | ICD-10-CM | POA: Insufficient documentation

## 2014-07-22 DIAGNOSIS — E876 Hypokalemia: Secondary | ICD-10-CM

## 2014-07-22 LAB — CBC WITH DIFFERENTIAL/PLATELET
Basophils Absolute: 0.1 10*3/uL (ref 0.0–0.1)
Basophils Relative: 1 % (ref 0–1)
EOS ABS: 0.2 10*3/uL (ref 0.0–0.7)
EOS PCT: 2 % (ref 0–5)
HCT: 38.7 % (ref 36.0–46.0)
Hemoglobin: 13.1 g/dL (ref 12.0–15.0)
LYMPHS ABS: 2.3 10*3/uL (ref 0.7–4.0)
Lymphocytes Relative: 26 % (ref 12–46)
MCH: 26 pg (ref 26.0–34.0)
MCHC: 33.9 g/dL (ref 30.0–36.0)
MCV: 76.8 fL — AB (ref 78.0–100.0)
Monocytes Absolute: 0.4 10*3/uL (ref 0.1–1.0)
Monocytes Relative: 4 % (ref 3–12)
Neutro Abs: 6 10*3/uL (ref 1.7–7.7)
Neutrophils Relative %: 67 % (ref 43–77)
Platelets: 254 10*3/uL (ref 150–400)
RBC: 5.04 MIL/uL (ref 3.87–5.11)
RDW: 14.1 % (ref 11.5–15.5)
WBC: 8.8 10*3/uL (ref 4.0–10.5)

## 2014-07-22 LAB — COMPREHENSIVE METABOLIC PANEL
ALBUMIN: 3.5 g/dL (ref 3.5–5.2)
ALK PHOS: 79 U/L (ref 39–117)
ALT: 11 U/L (ref 0–35)
ANION GAP: 4 — AB (ref 5–15)
AST: 16 U/L (ref 0–37)
BUN: 11 mg/dL (ref 6–23)
CO2: 33 mmol/L — ABNORMAL HIGH (ref 19–32)
Calcium: 9 mg/dL (ref 8.4–10.5)
Chloride: 105 mmol/L (ref 96–112)
Creatinine, Ser: 0.65 mg/dL (ref 0.50–1.10)
GFR calc non Af Amer: 85 mL/min — ABNORMAL LOW (ref >90)
Glucose, Bld: 118 mg/dL — ABNORMAL HIGH (ref 70–99)
POTASSIUM: 3.1 mmol/L — AB (ref 3.5–5.1)
SODIUM: 142 mmol/L (ref 135–145)
TOTAL PROTEIN: 7 g/dL (ref 6.0–8.3)
Total Bilirubin: 1.3 mg/dL — ABNORMAL HIGH (ref 0.3–1.2)

## 2014-07-22 MED ORDER — POTASSIUM CHLORIDE CRYS ER 20 MEQ PO TBCR: 20.0000 meq | EXTENDED_RELEASE_TABLET | Freq: Two times a day (BID) | ORAL | Status: DC

## 2014-07-23 LAB — CEA: CEA: 2 ng/mL (ref 0.0–4.7)

## 2014-07-23 NOTE — Telephone Encounter (Signed)
Erroneous enconter

## 2014-07-25 ENCOUNTER — Ambulatory Visit (HOSPITAL_COMMUNITY): Payer: Medicare Other | Admitting: Hematology & Oncology
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
## 2014-07-31 DIAGNOSIS — I10 Essential (primary) hypertension: Secondary | ICD-10-CM | POA: Diagnosis not present

## 2014-07-31 DIAGNOSIS — R21 Rash and other nonspecific skin eruption: Secondary | ICD-10-CM | POA: Diagnosis not present

## 2014-07-31 DIAGNOSIS — E118 Type 2 diabetes mellitus with unspecified complications: Secondary | ICD-10-CM | POA: Diagnosis not present

## 2014-08-17 NOTE — Progress Notes (Signed)
ERroneous Encounter

## 2014-08-19 ENCOUNTER — Encounter (HOSPITAL_COMMUNITY): Payer: Self-pay | Admitting: Hematology & Oncology

## 2014-08-19 ENCOUNTER — Encounter (HOSPITAL_COMMUNITY): Payer: Medicare Other | Attending: Hematology & Oncology | Admitting: Hematology & Oncology

## 2014-08-19 VITALS — BP 121/49 | HR 92 | Temp 97.7°F | Resp 16 | Wt 198.7 lb

## 2014-08-19 DIAGNOSIS — E876 Hypokalemia: Secondary | ICD-10-CM | POA: Insufficient documentation

## 2014-08-19 DIAGNOSIS — Z85038 Personal history of other malignant neoplasm of large intestine: Secondary | ICD-10-CM

## 2014-08-19 DIAGNOSIS — C189 Malignant neoplasm of colon, unspecified: Secondary | ICD-10-CM | POA: Insufficient documentation

## 2014-08-19 NOTE — Patient Instructions (Signed)
[place] at [facility] Discharge Instructions  RECOMMENDATIONS MADE BY THE CONSULTANT AND ANY TEST RESULTS WILL BE SENT TO YOUR REFERRING PHYSICIAN.  Exam and discussion by Dr. Whitney Muse. Your CEA is normal Report changes in bowel habits, blood in your bowel movement, unexplained weight loss, etc.  Follow-up with labs and office visit in 1 year.  Thank you for choosing [place] at [facility] to provide your oncology and hematology care.  To afford each patient quality time with our provider, please arrive at least 15 minutes before your scheduled appointment time.    You need to re-schedule your appointment should you arrive 10 or more minutes late.  We strive to give you quality time with our providers, and arriving late affects you and other patients whose appointments are after yours.  Also, if you no show three or more times for appointments you may be dismissed from the clinic at the providers discretion.     Again, thank you for choosing [facility].  Our hope is that these requests will decrease the amount of time that you wait before being seen by our physicians.       _____________________________________________________________  Should you have questions after your visit to [facility], please contact our office at [phone] between the hours of 8:30 a.m. and 4:30 p.m.  Voicemails left after 4:30 p.m. will not be returned until the following business day.  For prescription refill requests, have your pharmacy contact our office.
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
## 2014-09-10 DIAGNOSIS — M0589 Other rheumatoid arthritis with rheumatoid factor of multiple sites: Secondary | ICD-10-CM | POA: Diagnosis not present

## 2014-10-22 DIAGNOSIS — H26491 Other secondary cataract, right eye: Secondary | ICD-10-CM | POA: Diagnosis not present

## 2014-10-22 DIAGNOSIS — E119 Type 2 diabetes mellitus without complications: Secondary | ICD-10-CM | POA: Diagnosis not present

## 2014-10-22 DIAGNOSIS — Z961 Presence of intraocular lens: Secondary | ICD-10-CM | POA: Diagnosis not present

## 2014-10-29 DIAGNOSIS — I10 Essential (primary) hypertension: Secondary | ICD-10-CM | POA: Diagnosis not present

## 2014-10-29 DIAGNOSIS — E118 Type 2 diabetes mellitus with unspecified complications: Secondary | ICD-10-CM | POA: Diagnosis not present

## 2014-11-05 DIAGNOSIS — M0589 Other rheumatoid arthritis with rheumatoid factor of multiple sites: Secondary | ICD-10-CM | POA: Diagnosis not present

## 2014-12-03 DIAGNOSIS — R1310 Dysphagia, unspecified: Secondary | ICD-10-CM | POA: Diagnosis not present

## 2014-12-31 DIAGNOSIS — M150 Primary generalized (osteo)arthritis: Secondary | ICD-10-CM | POA: Diagnosis not present

## 2014-12-31 DIAGNOSIS — M0589 Other rheumatoid arthritis with rheumatoid factor of multiple sites: Secondary | ICD-10-CM | POA: Diagnosis not present

## 2014-12-31 DIAGNOSIS — M5136 Other intervertebral disc degeneration, lumbar region: Secondary | ICD-10-CM | POA: Diagnosis not present

## 2015-01-07 DIAGNOSIS — M0589 Other rheumatoid arthritis with rheumatoid factor of multiple sites: Secondary | ICD-10-CM | POA: Diagnosis not present

## 2015-01-23 DIAGNOSIS — Z23 Encounter for immunization: Secondary | ICD-10-CM | POA: Diagnosis not present

## 2015-01-28 DIAGNOSIS — E118 Type 2 diabetes mellitus with unspecified complications: Secondary | ICD-10-CM | POA: Diagnosis not present

## 2015-01-28 DIAGNOSIS — I10 Essential (primary) hypertension: Secondary | ICD-10-CM | POA: Diagnosis not present

## 2015-03-04 DIAGNOSIS — M0589 Other rheumatoid arthritis with rheumatoid factor of multiple sites: Secondary | ICD-10-CM | POA: Diagnosis not present

## 2015-03-27 DIAGNOSIS — M5136 Other intervertebral disc degeneration, lumbar region: Secondary | ICD-10-CM | POA: Diagnosis not present

## 2015-03-27 DIAGNOSIS — M7989 Other specified soft tissue disorders: Secondary | ICD-10-CM | POA: Diagnosis not present

## 2015-03-27 DIAGNOSIS — M150 Primary generalized (osteo)arthritis: Secondary | ICD-10-CM | POA: Diagnosis not present

## 2015-03-27 DIAGNOSIS — M0589 Other rheumatoid arthritis with rheumatoid factor of multiple sites: Secondary | ICD-10-CM | POA: Diagnosis not present

## 2015-04-22 ENCOUNTER — Other Ambulatory Visit: Payer: Self-pay | Admitting: Cardiology

## 2015-04-22 ENCOUNTER — Other Ambulatory Visit (HOSPITAL_COMMUNITY): Payer: Self-pay | Admitting: Respiratory Therapy

## 2015-04-22 DIAGNOSIS — R1310 Dysphagia, unspecified: Secondary | ICD-10-CM | POA: Diagnosis not present

## 2015-04-22 DIAGNOSIS — R55 Syncope and collapse: Secondary | ICD-10-CM

## 2015-04-24 DIAGNOSIS — R55 Syncope and collapse: Secondary | ICD-10-CM | POA: Insufficient documentation

## 2015-04-29 DIAGNOSIS — M0589 Other rheumatoid arthritis with rheumatoid factor of multiple sites: Secondary | ICD-10-CM | POA: Diagnosis not present

## 2015-04-29 DIAGNOSIS — M069 Rheumatoid arthritis, unspecified: Secondary | ICD-10-CM | POA: Diagnosis not present

## 2015-04-29 DIAGNOSIS — E119 Type 2 diabetes mellitus without complications: Secondary | ICD-10-CM | POA: Diagnosis not present

## 2015-04-29 DIAGNOSIS — I10 Essential (primary) hypertension: Secondary | ICD-10-CM | POA: Diagnosis not present

## 2015-05-01 ENCOUNTER — Ambulatory Visit
Admission: RE | Admit: 2015-05-01 | Discharge: 2015-05-01 | Disposition: A | Discharge status: Home / Self Care | Payer: Medicare Other | Source: Ambulatory Visit | Attending: Cardiology | Admitting: Cardiology

## 2015-05-01 ENCOUNTER — Ambulatory Visit (INDEPENDENT_AMBULATORY_CARE_PROVIDER_SITE_OTHER): Payer: Medicare Other

## 2015-05-01 DIAGNOSIS — R55 Syncope and collapse: Secondary | ICD-10-CM | POA: Diagnosis not present

## 2015-05-16 ENCOUNTER — Ambulatory Visit (HOSPITAL_COMMUNITY)
Admission: RE | Admit: 2015-05-16 | Discharge: 2015-05-16 | Disposition: A | Discharge status: Home / Self Care | Payer: Medicare Other | Source: Ambulatory Visit | Attending: Cardiology | Admitting: Cardiology

## 2015-05-16 DIAGNOSIS — R55 Syncope and collapse: Secondary | ICD-10-CM | POA: Diagnosis not present

## 2015-05-16 DIAGNOSIS — R569 Unspecified convulsions: Secondary | ICD-10-CM | POA: Diagnosis not present

## 2015-05-16 NOTE — Progress Notes (Signed)
EEG Completed; Results Pending  

## 2015-05-16 NOTE — Procedures (Signed)
  [place] A. Merlene Laughter, MD     [email or web address]           HISTORY: The patient is a 75 year old female who presents with episodes of recurrent syncope.  MEDICATIONS: Scheduled Meds: Continuous Infusions: PRN Meds:.  Prior to Admission medications   Medication Sig Start Date End Date Taking? Authorizing Provider  amLODipine (NORVASC) 5 MG tablet Take 5 mg by mouth daily.    Historical Provider, MD  aspirin 81 MG tablet Take 81 mg by mouth daily.    Historical Provider, MD  cyclobenzaprine (FLEXERIL) 5 MG tablet Take 1 tablet (5 mg total) by mouth 3 (three) times daily as needed for muscle spasms. 12/11/13   Rolland Porter, MD  doxepin (SINEQUAN) 25 MG capsule Take 1 or 2 capsules at bedtime. Patient not taking: Reported on 08/19/2014 07/24/13   Farrel Gobble, MD  folic acid (FOLVITE) 1 MG tablet Take 1 mg by mouth daily.    Historical Provider, MD  golimumab (SIMPONI ARIA) 50 MG/4ML SOLN Inject 50 mg into the vein every 8 (eight) weeks.    Historical Provider, MD  HYDROcodone-acetaminophen (VICODIN) 5-500 MG per tablet Take 1 tablet by mouth every 6 (six) hours as needed.    Historical Provider, MD  isosorbide mononitrate (IMDUR) 30 MG 24 hr tablet Take 30 mg by mouth daily. 04/28/13   Historical Provider, MD  leflunomide (ARAVA) 20 MG tablet Take 20 mg by mouth daily.    Historical Provider, MD  losartan-hydrochlorothiazide (HYZAAR) 100-25 MG per tablet Take 1 tablet by mouth every other day.     Historical Provider, MD  metFORMIN (GLUCOPHAGE) 500 MG tablet Take 500 mg by mouth 2 (two) times daily with a meal.    Historical Provider, MD  potassium chloride SA (K-DUR,KLOR-CON) 20 MEQ tablet Take 1 tablet (20 mEq total) by mouth 2 (two) times daily. 07/22/14   Baird Cancer, PA-C  predniSONE (DELTASONE) 5 MG tablet Take 5 mg by mouth every other day.    Historical Provider, MD  traMADol (ULTRAM) 50 MG tablet Take 2 tablets (100 mg total) by mouth every 6 (six) hours as  needed. 12/11/13   Rolland Porter, MD      ANALYSIS: A 16 channel recording using standard 10 20 measurements is conducted for 22 minutes.  There is a well-formed posterior dominant rhythm of 9 Hz which attenuates with eye opening. There is beta activity observed in the frontal areas. Awake and sleep architecture is observed. There is K complexes and sleep spindles indicating stage II non-REM sleep. There is no focal or lateral slowing. The patient is noted to have several episodes of sharp wave activity associated with a field in which phase reverses at T3.    IMPRESSION: This recording is abnormal. 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      Laure Leone A. Merlene Laughter, M.D.  Diplomate, Tax adviser of Psychiatry and Neurology ( Neurology).

## 2015-05-29 DIAGNOSIS — M47816 Spondylosis without myelopathy or radiculopathy, lumbar region: Secondary | ICD-10-CM | POA: Diagnosis not present

## 2015-05-29 DIAGNOSIS — M545 Low back pain: Secondary | ICD-10-CM | POA: Diagnosis not present

## 2015-05-29 DIAGNOSIS — M9903 Segmental and somatic dysfunction of lumbar region: Secondary | ICD-10-CM | POA: Diagnosis not present

## 2015-06-10 DIAGNOSIS — R55 Syncope and collapse: Secondary | ICD-10-CM | POA: Diagnosis not present

## 2015-06-10 DIAGNOSIS — E1140 Type 2 diabetes mellitus with diabetic neuropathy, unspecified: Secondary | ICD-10-CM | POA: Diagnosis not present

## 2015-06-10 DIAGNOSIS — M130 Polyarthritis, unspecified: Secondary | ICD-10-CM | POA: Diagnosis not present

## 2015-06-10 DIAGNOSIS — R260 Ataxic gait: Secondary | ICD-10-CM | POA: Diagnosis not present

## 2015-07-01 DIAGNOSIS — R1310 Dysphagia, unspecified: Secondary | ICD-10-CM | POA: Diagnosis not present

## 2015-07-08 DIAGNOSIS — M0589 Other rheumatoid arthritis with rheumatoid factor of multiple sites: Secondary | ICD-10-CM | POA: Diagnosis not present

## 2015-07-08 DIAGNOSIS — Z79899 Other long term (current) drug therapy: Secondary | ICD-10-CM | POA: Diagnosis not present

## 2015-07-29 DIAGNOSIS — E118 Type 2 diabetes mellitus with unspecified complications: Secondary | ICD-10-CM | POA: Diagnosis not present

## 2015-07-29 DIAGNOSIS — Z6832 Body mass index (BMI) 32.0-32.9, adult: Secondary | ICD-10-CM | POA: Diagnosis not present

## 2015-07-29 DIAGNOSIS — I10 Essential (primary) hypertension: Secondary | ICD-10-CM | POA: Diagnosis not present

## 2015-07-29 DIAGNOSIS — G40909 Epilepsy, unspecified, not intractable, without status epilepticus: Secondary | ICD-10-CM | POA: Diagnosis not present

## 2015-08-05 DIAGNOSIS — E1140 Type 2 diabetes mellitus with diabetic neuropathy, unspecified: Secondary | ICD-10-CM | POA: Diagnosis not present

## 2015-08-05 DIAGNOSIS — R260 Ataxic gait: Secondary | ICD-10-CM | POA: Diagnosis not present

## 2015-08-05 DIAGNOSIS — R55 Syncope and collapse: Secondary | ICD-10-CM | POA: Diagnosis not present

## 2015-08-05 DIAGNOSIS — R569 Unspecified convulsions: Secondary | ICD-10-CM | POA: Diagnosis not present

## 2015-08-05 DIAGNOSIS — M130 Polyarthritis, unspecified: Secondary | ICD-10-CM | POA: Diagnosis not present

## 2015-08-20 ENCOUNTER — Encounter (HOSPITAL_COMMUNITY): Payer: Self-pay | Admitting: Oncology

## 2015-08-20 DIAGNOSIS — C189 Malignant neoplasm of colon, unspecified: Secondary | ICD-10-CM | POA: Insufficient documentation
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
## 2015-08-20 NOTE — Assessment & Plan Note (Addendum)
Stage III colon cancer diagnosed in November of 2001 with 2 of 4 positive lymph nodes with lymphovascular invasion found treated adjuvantly with 8 cycles of FOLFIRI. She still gets screening colonoscopies with her last colonoscopy late 2015. She gets these in [place].  Labs today: CBC diff, CMET, CEA, ferritin.  Of note, on her labs last year, her HGB was WNL, but MCV was low.  Therefore, I will check a ferritin today.  Her microcytosis dates back many years.  Since being seen last, the patient has started to see Dr. Merlene Laughter related to possible seizure activity. She is on Keppra and is managed by Dr. Merlene Laughter.  I will defer any insomnia medications to her primary care physician.  Severe hypokalemia noted today. Orders place for 60 mEq of by mouth potassium chloride today here in the clinic. I have refilled her K-Dur and she is to take 40 mEq daily. I provided her a 1 month supply. She should have follow-up with her primary care physician regarding her hypokalemia and for further refills of potassium replacement. She is on a hydrochlorothiazide containing antihypertensive which is likely the culprit of her hypokalemia.  Labs in 12 months: CBC diff, CMET, CEA  With the recent expansion of survivorship clinic to [facility], this patient would be an excellent candidate for survivorship care plan development. She does not have sequela from treatment at this time.  She is scheduled to have an appointment on 09/02/2015 with Mike Craze, NP for survivorship planning.  Return in 12 months for medical oncology follow-up.

## 2015-08-21 ENCOUNTER — Other Ambulatory Visit (HOSPITAL_COMMUNITY): Payer: BC Managed Care – PPO

## 2015-08-21 ENCOUNTER — Encounter (HOSPITAL_BASED_OUTPATIENT_CLINIC_OR_DEPARTMENT_OTHER): Payer: Medicare Other | Admitting: Oncology

## 2015-08-21 ENCOUNTER — Encounter (HOSPITAL_COMMUNITY): Payer: Self-pay | Admitting: Oncology

## 2015-08-21 ENCOUNTER — Encounter (HOSPITAL_COMMUNITY): Payer: Medicare Other | Attending: Hematology & Oncology

## 2015-08-21 ENCOUNTER — Ambulatory Visit (HOSPITAL_COMMUNITY): Payer: BC Managed Care – PPO | Admitting: Oncology

## 2015-08-21 VITALS — BP 173/71 | HR 70 | Resp 18 | Wt 195.3 lb

## 2015-08-21 DIAGNOSIS — I10 Essential (primary) hypertension: Secondary | ICD-10-CM | POA: Diagnosis not present

## 2015-08-21 DIAGNOSIS — M1990 Unspecified osteoarthritis, unspecified site: Secondary | ICD-10-CM | POA: Insufficient documentation

## 2015-08-21 DIAGNOSIS — C189 Malignant neoplasm of colon, unspecified: Secondary | ICD-10-CM | POA: Diagnosis not present

## 2015-08-21 DIAGNOSIS — E669 Obesity, unspecified: Secondary | ICD-10-CM | POA: Insufficient documentation

## 2015-08-21 DIAGNOSIS — Z7984 Long term (current) use of oral hypoglycemic drugs: Secondary | ICD-10-CM | POA: Insufficient documentation

## 2015-08-21 DIAGNOSIS — Z9889 Other specified postprocedural states: Secondary | ICD-10-CM | POA: Diagnosis not present

## 2015-08-21 DIAGNOSIS — Z79899 Other long term (current) drug therapy: Secondary | ICD-10-CM | POA: Insufficient documentation

## 2015-08-21 DIAGNOSIS — E876 Hypokalemia: Secondary | ICD-10-CM | POA: Insufficient documentation

## 2015-08-21 DIAGNOSIS — Z86718 Personal history of other venous thrombosis and embolism: Secondary | ICD-10-CM | POA: Diagnosis not present

## 2015-08-21 DIAGNOSIS — R718 Other abnormality of red blood cells: Secondary | ICD-10-CM | POA: Insufficient documentation

## 2015-08-21 DIAGNOSIS — Z7982 Long term (current) use of aspirin: Secondary | ICD-10-CM | POA: Insufficient documentation

## 2015-08-21 DIAGNOSIS — E119 Type 2 diabetes mellitus without complications: Secondary | ICD-10-CM | POA: Insufficient documentation

## 2015-08-21 LAB — COMPREHENSIVE METABOLIC PANEL
ALBUMIN: 3.5 g/dL (ref 3.5–5.0)
ALT: 18 U/L (ref 14–54)
AST: 20 U/L (ref 15–41)
Alkaline Phosphatase: 87 U/L (ref 38–126)
Anion gap: 9 (ref 5–15)
BILIRUBIN TOTAL: 0.8 mg/dL (ref 0.3–1.2)
BUN: 15 mg/dL (ref 6–20)
CHLORIDE: 103 mmol/L (ref 101–111)
CO2: 31 mmol/L (ref 22–32)
Calcium: 8.2 mg/dL — ABNORMAL LOW (ref 8.9–10.3)
Creatinine, Ser: 0.67 mg/dL (ref 0.44–1.00)
GFR calc Af Amer: >60 mL/min (ref >60)
GFR calc non Af Amer: >60 mL/min (ref >60)
GLUCOSE: 142 mg/dL — AB (ref 65–99)
Potassium: 2.9 mmol/L — ABNORMAL LOW (ref 3.5–5.1)
Sodium: 143 mmol/L (ref 135–145)
Total Protein: 6.7 g/dL (ref 6.5–8.1)

## 2015-08-21 LAB — CBC WITH DIFFERENTIAL/PLATELET
BASOS ABS: 0.1 10*3/uL (ref 0.0–0.1)
BASOS PCT: 1 %
Eosinophils Absolute: 0.2 10*3/uL (ref 0.0–0.7)
Eosinophils Relative: 3 %
HEMATOCRIT: 35.4 % — AB (ref 36.0–46.0)
Hemoglobin: 12.2 g/dL (ref 12.0–15.0)
Lymphocytes Relative: 34 %
Lymphs Abs: 2.8 10*3/uL (ref 0.7–4.0)
MCH: 25.7 pg — ABNORMAL LOW (ref 26.0–34.0)
MCHC: 34.5 g/dL (ref 30.0–36.0)
MCV: 74.7 fL — ABNORMAL LOW (ref 78.0–100.0)
MONO ABS: 0.6 10*3/uL (ref 0.1–1.0)
Monocytes Relative: 8 %
NEUTROS ABS: 4.4 10*3/uL (ref 1.7–7.7)
Neutrophils Relative %: 55 %
PLATELETS: 219 10*3/uL (ref 150–400)
RBC: 4.74 MIL/uL (ref 3.87–5.11)
RDW: 13.4 % (ref 11.5–15.5)
WBC: 8.1 10*3/uL (ref 4.0–10.5)

## 2015-08-21 LAB — FERRITIN: Ferritin: 108 ng/mL (ref 11–307)

## 2015-08-21 MED ORDER — POTASSIUM CHLORIDE CRYS ER 20 MEQ PO TBCR: 20.0000 meq | EXTENDED_RELEASE_TABLET | Freq: Two times a day (BID) | ORAL | Status: DC

## 2015-08-21 MED ORDER — POTASSIUM CHLORIDE CRYS ER 20 MEQ PO TBCR
60.0000 meq | EXTENDED_RELEASE_TABLET | Freq: Once | ORAL | Status: AC
  Administered 2015-08-21: 60 meq via ORAL
  Filled 2015-08-21: qty 3

## 2015-08-21 NOTE — Patient Instructions (Signed)
[place] at [facility] Discharge Instructions  RECOMMENDATIONS MADE BY THE CONSULTANT AND ANY TEST RESULTS WILL BE SENT TO YOUR REFERRING PHYSICIAN.  Labs today show a low potassium level.  It is probably from one of your blood pressure medications. I will give you a dose of potassium here in the clinic. I have refilled your potassium for 1 month.  You refills should come from your primary care physician, Dr. Berdine Addison. Please talk to Dr. Berdine Addison and Dr. Merlene Laughter about insomnia and possible interventions for this. We will let you know what your CEA is when it is resulted to [place].   Labs in 12 months for labs and follow-up appointment. We will refer you to our survivorship program.  You will get a separate appointment for this appointment. Return in 12 months for follow-up appointment.  Thank you for choosing [place] at [facility] to provide your oncology and hematology care.  To afford each patient quality time with our provider, please arrive at least 15 minutes before your scheduled appointment time.   Beginning January 23rd 2017 lab work for the [facility] will be done in the  Main lab at [facility] on 1st floor. If you have a lab appointment with the [facility] please come in thru the  Main Entrance and check in at the main information desk  You need to re-schedule your appointment should you arrive 10 or more minutes late.  We strive to give you quality time with our providers, and arriving late affects you and other patients whose appointments are after yours.  Also, if you no show three or more times for appointments you may be dismissed from the clinic at the providers discretion.     Again, thank you for choosing [facility].  Our hope is that these requests will decrease the amount of time that you wait before being seen by our physicians.       _____________________________________________________________  Should you  have questions after your visit to [facility], please contact our office at [phone] between the hours of 8:30 a.m. and 4:30 p.m.  Voicemails left after 4:30 p.m. will not be returned until the following business day.  For prescription refill requests, have your pharmacy contact our office.         Resources For Cancer Patients and their Caregivers ? American Cancer Society: Can assist with transportation, wigs, general needs, runs Look Good Feel Better.        769-854-6540 ? Cancer Care: Provides financial assistance, online support groups, medication/co-pay assistance.  1-800-813-HOPE 509-417-7178) ? [facility] Assists [place] cancer patients and their families through emotional , educational and financial support.  (780)757-6420 ? [place] DSS Where to apply for food stamps, Medicaid and utility assistance. 670 823 3950 ? [facility]: Transportation to medical appointments. 314-125-6273 ? Social Security Administration: May apply for disability if have a Stage IV cancer. (415)610-3442 667 673 9517 ? [employer], Disability and Transit Services: Assists with nutrition, care and transit needs. 260-664-0239

## 2015-08-22 LAB — CEA: CEA: 2.2 ng/mL (ref 0.0–4.7)

## 2015-09-02 ENCOUNTER — Ambulatory Visit (HOSPITAL_COMMUNITY): Payer: Medicare Other | Admitting: Adult Health

## 2015-09-02 DIAGNOSIS — Z79899 Other long term (current) drug therapy: Secondary | ICD-10-CM | POA: Diagnosis not present

## 2015-09-02 DIAGNOSIS — M0589 Other rheumatoid arthritis with rheumatoid factor of multiple sites: Secondary | ICD-10-CM | POA: Diagnosis not present

## 2015-09-08 DIAGNOSIS — M0589 Other rheumatoid arthritis with rheumatoid factor of multiple sites: Secondary | ICD-10-CM | POA: Diagnosis not present

## 2015-09-08 DIAGNOSIS — M150 Primary generalized (osteo)arthritis: Secondary | ICD-10-CM | POA: Diagnosis not present

## 2015-09-08 DIAGNOSIS — M5136 Other intervertebral disc degeneration, lumbar region: Secondary | ICD-10-CM | POA: Diagnosis not present

## 2015-09-16 ENCOUNTER — Ambulatory Visit (HOSPITAL_COMMUNITY): Payer: Medicare Other | Admitting: Adult Health

## 2015-09-16 DIAGNOSIS — R1310 Dysphagia, unspecified: Secondary | ICD-10-CM | POA: Diagnosis not present

## 2015-09-18 DIAGNOSIS — M47816 Spondylosis without myelopathy or radiculopathy, lumbar region: Secondary | ICD-10-CM | POA: Diagnosis not present

## 2015-09-18 DIAGNOSIS — M545 Low back pain: Secondary | ICD-10-CM | POA: Diagnosis not present

## 2015-09-18 DIAGNOSIS — M9903 Segmental and somatic dysfunction of lumbar region: Secondary | ICD-10-CM | POA: Diagnosis not present

## 2015-09-22 DIAGNOSIS — M545 Low back pain: Secondary | ICD-10-CM | POA: Diagnosis not present

## 2015-09-22 DIAGNOSIS — M47816 Spondylosis without myelopathy or radiculopathy, lumbar region: Secondary | ICD-10-CM | POA: Diagnosis not present

## 2015-09-22 DIAGNOSIS — M9903 Segmental and somatic dysfunction of lumbar region: Secondary | ICD-10-CM | POA: Diagnosis not present

## 2015-09-24 DIAGNOSIS — M9903 Segmental and somatic dysfunction of lumbar region: Secondary | ICD-10-CM | POA: Diagnosis not present

## 2015-09-24 DIAGNOSIS — M545 Low back pain: Secondary | ICD-10-CM | POA: Diagnosis not present

## 2015-09-24 DIAGNOSIS — M47816 Spondylosis without myelopathy or radiculopathy, lumbar region: Secondary | ICD-10-CM | POA: Diagnosis not present

## 2015-09-26 DIAGNOSIS — M545 Low back pain: Secondary | ICD-10-CM | POA: Diagnosis not present

## 2015-09-26 DIAGNOSIS — M9903 Segmental and somatic dysfunction of lumbar region: Secondary | ICD-10-CM | POA: Diagnosis not present

## 2015-09-26 DIAGNOSIS — M47816 Spondylosis without myelopathy or radiculopathy, lumbar region: Secondary | ICD-10-CM | POA: Diagnosis not present

## 2015-09-30 ENCOUNTER — Encounter (HOSPITAL_COMMUNITY): Payer: Medicare Other | Attending: Hematology & Oncology | Admitting: Adult Health

## 2015-09-30 ENCOUNTER — Encounter (HOSPITAL_COMMUNITY): Payer: Self-pay | Admitting: Adult Health

## 2015-09-30 ENCOUNTER — Other Ambulatory Visit: Payer: Self-pay | Admitting: Adult Health

## 2015-09-30 VITALS — BP 149/66 | HR 65 | Temp 98.0°F | Resp 14

## 2015-09-30 DIAGNOSIS — Z86718 Personal history of other venous thrombosis and embolism: Secondary | ICD-10-CM | POA: Insufficient documentation

## 2015-09-30 DIAGNOSIS — Z7982 Long term (current) use of aspirin: Secondary | ICD-10-CM | POA: Insufficient documentation

## 2015-09-30 DIAGNOSIS — Z85038 Personal history of other malignant neoplasm of large intestine: Secondary | ICD-10-CM | POA: Diagnosis not present

## 2015-09-30 DIAGNOSIS — I10 Essential (primary) hypertension: Secondary | ICD-10-CM | POA: Insufficient documentation

## 2015-09-30 DIAGNOSIS — Z1231 Encounter for screening mammogram for malignant neoplasm of breast: Secondary | ICD-10-CM | POA: Diagnosis not present

## 2015-09-30 DIAGNOSIS — Z7984 Long term (current) use of oral hypoglycemic drugs: Secondary | ICD-10-CM | POA: Insufficient documentation

## 2015-09-30 DIAGNOSIS — Z9889 Other specified postprocedural states: Secondary | ICD-10-CM | POA: Insufficient documentation

## 2015-09-30 DIAGNOSIS — E119 Type 2 diabetes mellitus without complications: Secondary | ICD-10-CM | POA: Insufficient documentation

## 2015-09-30 DIAGNOSIS — M1990 Unspecified osteoarthritis, unspecified site: Secondary | ICD-10-CM | POA: Insufficient documentation

## 2015-09-30 DIAGNOSIS — C189 Malignant neoplasm of colon, unspecified: Secondary | ICD-10-CM | POA: Insufficient documentation

## 2015-09-30 DIAGNOSIS — R718 Other abnormality of red blood cells: Secondary | ICD-10-CM | POA: Insufficient documentation

## 2015-09-30 DIAGNOSIS — E876 Hypokalemia: Secondary | ICD-10-CM | POA: Insufficient documentation

## 2015-09-30 DIAGNOSIS — E669 Obesity, unspecified: Secondary | ICD-10-CM | POA: Insufficient documentation

## 2015-09-30 DIAGNOSIS — Z79899 Other long term (current) drug therapy: Secondary | ICD-10-CM | POA: Insufficient documentation
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
## 2015-10-03 DIAGNOSIS — M47816 Spondylosis without myelopathy or radiculopathy, lumbar region: Secondary | ICD-10-CM | POA: Diagnosis not present

## 2015-10-03 DIAGNOSIS — M545 Low back pain: Secondary | ICD-10-CM | POA: Diagnosis not present

## 2015-10-03 DIAGNOSIS — M9903 Segmental and somatic dysfunction of lumbar region: Secondary | ICD-10-CM | POA: Diagnosis not present

## 2015-10-07 DIAGNOSIS — M818 Other osteoporosis without current pathological fracture: Secondary | ICD-10-CM | POA: Diagnosis not present

## 2015-10-07 DIAGNOSIS — R5383 Other fatigue: Secondary | ICD-10-CM | POA: Diagnosis not present

## 2015-10-07 DIAGNOSIS — Z79899 Other long term (current) drug therapy: Secondary | ICD-10-CM | POA: Diagnosis not present

## 2015-10-07 DIAGNOSIS — R55 Syncope and collapse: Secondary | ICD-10-CM | POA: Diagnosis not present

## 2015-10-07 DIAGNOSIS — E559 Vitamin D deficiency, unspecified: Secondary | ICD-10-CM | POA: Diagnosis not present

## 2015-10-07 DIAGNOSIS — M130 Polyarthritis, unspecified: Secondary | ICD-10-CM | POA: Diagnosis not present

## 2015-10-07 DIAGNOSIS — R260 Ataxic gait: Secondary | ICD-10-CM | POA: Diagnosis not present

## 2015-10-07 DIAGNOSIS — E876 Hypokalemia: Secondary | ICD-10-CM | POA: Diagnosis not present

## 2015-10-07 DIAGNOSIS — E538 Deficiency of other specified B group vitamins: Secondary | ICD-10-CM | POA: Diagnosis not present

## 2015-10-07 DIAGNOSIS — E1140 Type 2 diabetes mellitus with diabetic neuropathy, unspecified: Secondary | ICD-10-CM | POA: Diagnosis not present

## 2015-10-07 DIAGNOSIS — G40119 Localization-related (focal) (partial) symptomatic epilepsy and epileptic syndromes with simple partial seizures, intractable, without status epilepticus: Secondary | ICD-10-CM | POA: Diagnosis not present

## 2015-10-09 DIAGNOSIS — M47816 Spondylosis without myelopathy or radiculopathy, lumbar region: Secondary | ICD-10-CM | POA: Diagnosis not present

## 2015-10-09 DIAGNOSIS — M545 Low back pain: Secondary | ICD-10-CM | POA: Diagnosis not present

## 2015-10-09 DIAGNOSIS — M9903 Segmental and somatic dysfunction of lumbar region: Secondary | ICD-10-CM | POA: Diagnosis not present

## 2015-10-15 ENCOUNTER — Ambulatory Visit (HOSPITAL_COMMUNITY): Payer: Medicare Other

## 2015-10-16 ENCOUNTER — Ambulatory Visit (HOSPITAL_COMMUNITY): Payer: Medicare Other

## 2015-10-16 DIAGNOSIS — M545 Low back pain: Secondary | ICD-10-CM | POA: Diagnosis not present

## 2015-10-16 DIAGNOSIS — M47816 Spondylosis without myelopathy or radiculopathy, lumbar region: Secondary | ICD-10-CM | POA: Diagnosis not present

## 2015-10-16 DIAGNOSIS — M9903 Segmental and somatic dysfunction of lumbar region: Secondary | ICD-10-CM | POA: Diagnosis not present

## 2015-10-22 ENCOUNTER — Ambulatory Visit (HOSPITAL_COMMUNITY)
Admission: RE | Admit: 2015-10-22 | Discharge: 2015-10-22 | Disposition: A | Discharge status: Home / Self Care | Payer: Medicare Other | Source: Ambulatory Visit | Attending: Adult Health | Admitting: Adult Health

## 2015-10-22 DIAGNOSIS — Z1231 Encounter for screening mammogram for malignant neoplasm of breast: Secondary | ICD-10-CM

## 2015-10-24 ENCOUNTER — Telehealth: Payer: Self-pay | Admitting: Adult Health

## 2015-10-24 NOTE — Telephone Encounter (Signed)
Attempted to reach Ms. Schoolman to give her the results of her screening mammogram, which was negative.    Left message for patient to return my call when she is able. Awaiting return call.   Mike Craze, NP [place] [phone]

## 2015-10-28 DIAGNOSIS — M0589 Other rheumatoid arthritis with rheumatoid factor of multiple sites: Secondary | ICD-10-CM | POA: Diagnosis not present

## 2015-10-30 DIAGNOSIS — M545 Low back pain: Secondary | ICD-10-CM | POA: Diagnosis not present

## 2015-10-30 DIAGNOSIS — M47816 Spondylosis without myelopathy or radiculopathy, lumbar region: Secondary | ICD-10-CM | POA: Diagnosis not present

## 2015-10-30 DIAGNOSIS — M9903 Segmental and somatic dysfunction of lumbar region: Secondary | ICD-10-CM | POA: Diagnosis not present

## 2015-11-05 DIAGNOSIS — I10 Essential (primary) hypertension: Secondary | ICD-10-CM | POA: Diagnosis not present

## 2015-11-05 DIAGNOSIS — E119 Type 2 diabetes mellitus without complications: Secondary | ICD-10-CM | POA: Diagnosis not present

## 2015-11-13 DIAGNOSIS — M47816 Spondylosis without myelopathy or radiculopathy, lumbar region: Secondary | ICD-10-CM | POA: Diagnosis not present

## 2015-11-13 DIAGNOSIS — M545 Low back pain: Secondary | ICD-10-CM | POA: Diagnosis not present

## 2015-11-13 DIAGNOSIS — M9903 Segmental and somatic dysfunction of lumbar region: Secondary | ICD-10-CM | POA: Diagnosis not present

## 2015-11-27 DIAGNOSIS — M9903 Segmental and somatic dysfunction of lumbar region: Secondary | ICD-10-CM | POA: Diagnosis not present

## 2015-11-27 DIAGNOSIS — M47816 Spondylosis without myelopathy or radiculopathy, lumbar region: Secondary | ICD-10-CM | POA: Diagnosis not present

## 2015-11-27 DIAGNOSIS — M545 Low back pain: Secondary | ICD-10-CM | POA: Diagnosis not present

## 2015-12-11 DIAGNOSIS — M0589 Other rheumatoid arthritis with rheumatoid factor of multiple sites: Secondary | ICD-10-CM | POA: Diagnosis not present

## 2015-12-11 DIAGNOSIS — M150 Primary generalized (osteo)arthritis: Secondary | ICD-10-CM | POA: Diagnosis not present

## 2015-12-11 DIAGNOSIS — M06341 Rheumatoid nodule, right hand: Secondary | ICD-10-CM | POA: Diagnosis not present

## 2015-12-11 DIAGNOSIS — M5136 Other intervertebral disc degeneration, lumbar region: Secondary | ICD-10-CM | POA: Diagnosis not present

## 2015-12-16 DIAGNOSIS — M545 Low back pain: Secondary | ICD-10-CM | POA: Diagnosis not present

## 2015-12-16 DIAGNOSIS — M9903 Segmental and somatic dysfunction of lumbar region: Secondary | ICD-10-CM | POA: Diagnosis not present

## 2015-12-16 DIAGNOSIS — M47816 Spondylosis without myelopathy or radiculopathy, lumbar region: Secondary | ICD-10-CM | POA: Diagnosis not present

## 2015-12-18 ENCOUNTER — Encounter (HOSPITAL_COMMUNITY): Payer: Self-pay | Admitting: Emergency Medicine

## 2015-12-18 ENCOUNTER — Emergency Department (HOSPITAL_COMMUNITY)
Admission: EM | Admit: 2015-12-18 | Discharge: 2015-12-18 | Disposition: A | Discharge status: Home / Self Care | Payer: Medicare Other | Attending: Emergency Medicine | Admitting: Emergency Medicine

## 2015-12-18 ENCOUNTER — Emergency Department (HOSPITAL_COMMUNITY): Payer: Medicare Other

## 2015-12-18 DIAGNOSIS — I10 Essential (primary) hypertension: Secondary | ICD-10-CM | POA: Insufficient documentation

## 2015-12-18 DIAGNOSIS — R42 Dizziness and giddiness: Secondary | ICD-10-CM

## 2015-12-18 DIAGNOSIS — R61 Generalized hyperhidrosis: Secondary | ICD-10-CM | POA: Diagnosis not present

## 2015-12-18 DIAGNOSIS — J3489 Other specified disorders of nose and nasal sinuses: Secondary | ICD-10-CM | POA: Insufficient documentation

## 2015-12-18 DIAGNOSIS — E119 Type 2 diabetes mellitus without complications: Secondary | ICD-10-CM | POA: Diagnosis not present

## 2015-12-18 DIAGNOSIS — Z7984 Long term (current) use of oral hypoglycemic drugs: Secondary | ICD-10-CM | POA: Diagnosis not present

## 2015-12-18 DIAGNOSIS — Z85038 Personal history of other malignant neoplasm of large intestine: Secondary | ICD-10-CM | POA: Diagnosis not present

## 2015-12-18 DIAGNOSIS — Z79899 Other long term (current) drug therapy: Secondary | ICD-10-CM | POA: Insufficient documentation

## 2015-12-18 DIAGNOSIS — E876 Hypokalemia: Secondary | ICD-10-CM

## 2015-12-18 DIAGNOSIS — Z7982 Long term (current) use of aspirin: Secondary | ICD-10-CM | POA: Insufficient documentation

## 2015-12-18 DIAGNOSIS — R1111 Vomiting without nausea: Secondary | ICD-10-CM | POA: Diagnosis not present

## 2015-12-18 DIAGNOSIS — R55 Syncope and collapse: Secondary | ICD-10-CM | POA: Diagnosis not present

## 2015-12-18 LAB — BASIC METABOLIC PANEL
ANION GAP: 6 (ref 5–15)
BUN: 14 mg/dL (ref 6–20)
CHLORIDE: 103 mmol/L (ref 101–111)
CO2: 31 mmol/L (ref 22–32)
Calcium: 8.3 mg/dL — ABNORMAL LOW (ref 8.9–10.3)
Creatinine, Ser: 0.8 mg/dL (ref 0.44–1.00)
GFR calc Af Amer: >60 mL/min (ref >60)
GFR calc non Af Amer: >60 mL/min (ref >60)
GLUCOSE: 107 mg/dL — AB (ref 65–99)
POTASSIUM: 3.3 mmol/L — AB (ref 3.5–5.1)
Sodium: 140 mmol/L (ref 135–145)

## 2015-12-18 LAB — URINALYSIS, ROUTINE W REFLEX MICROSCOPIC
Bilirubin Urine: NEGATIVE
GLUCOSE, UA: NEGATIVE mg/dL
HGB URINE DIPSTICK: NEGATIVE
KETONES UR: NEGATIVE mg/dL
LEUKOCYTES UA: NEGATIVE
Nitrite: NEGATIVE
PH: 6 (ref 5.0–8.0)
PROTEIN: NEGATIVE mg/dL
Specific Gravity, Urine: 1.025 (ref 1.005–1.030)

## 2015-12-18 LAB — CBC WITH DIFFERENTIAL/PLATELET
BASOS ABS: 0 10*3/uL (ref 0.0–0.1)
Basophils Relative: 0 %
EOS PCT: 1 %
Eosinophils Absolute: 0.1 10*3/uL (ref 0.0–0.7)
HEMATOCRIT: 37.2 % (ref 36.0–46.0)
Hemoglobin: 12.7 g/dL (ref 12.0–15.0)
LYMPHS ABS: 1.8 10*3/uL (ref 0.7–4.0)
LYMPHS PCT: 16 %
MCH: 25.9 pg — AB (ref 26.0–34.0)
MCHC: 34.1 g/dL (ref 30.0–36.0)
MCV: 75.8 fL — AB (ref 78.0–100.0)
MONO ABS: 0.7 10*3/uL (ref 0.1–1.0)
MONOS PCT: 6 %
NEUTROS ABS: 8.4 10*3/uL — AB (ref 1.7–7.7)
Neutrophils Relative %: 77 %
PLATELETS: 211 10*3/uL (ref 150–400)
RBC: 4.91 MIL/uL (ref 3.87–5.11)
RDW: 14.2 % (ref 11.5–15.5)
WBC: 11.1 10*3/uL — ABNORMAL HIGH (ref 4.0–10.5)

## 2015-12-18 NOTE — ED Notes (Signed)
Pt aware urine specimen needed. Not able to provide at this time.Will recheck.

## 2015-12-18 NOTE — ED Notes (Signed)
Ambulated w/ Pt. Tolerated Well.

## 2015-12-18 NOTE — ED Notes (Signed)
Patient transported to CT 

## 2015-12-18 NOTE — ED Notes (Signed)
Pt ambulated around ER.  Denies any dizziness or any other complaints.  States she is ready to go home,.

## 2015-12-18 NOTE — ED Triage Notes (Signed)
[place] and she had to sit down and began vomiting.  States she feels much better.  Denies any complaints currently.

## 2015-12-18 NOTE — ED Notes (Signed)
Pt returned from CT °
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
## 2015-12-18 NOTE — ED Notes (Signed)
MD at bedside. 

## 2015-12-22 DIAGNOSIS — M069 Rheumatoid arthritis, unspecified: Secondary | ICD-10-CM | POA: Diagnosis not present

## 2015-12-22 DIAGNOSIS — R569 Unspecified convulsions: Secondary | ICD-10-CM | POA: Diagnosis not present

## 2015-12-23 DIAGNOSIS — I951 Orthostatic hypotension: Secondary | ICD-10-CM | POA: Diagnosis not present

## 2015-12-23 DIAGNOSIS — R55 Syncope and collapse: Secondary | ICD-10-CM | POA: Diagnosis not present

## 2015-12-23 DIAGNOSIS — M130 Polyarthritis, unspecified: Secondary | ICD-10-CM | POA: Diagnosis not present

## 2015-12-23 DIAGNOSIS — E876 Hypokalemia: Secondary | ICD-10-CM | POA: Diagnosis not present

## 2015-12-23 DIAGNOSIS — G40119 Localization-related (focal) (partial) symptomatic epilepsy and epileptic syndromes with simple partial seizures, intractable, without status epilepticus: Secondary | ICD-10-CM | POA: Diagnosis not present

## 2015-12-23 DIAGNOSIS — E1140 Type 2 diabetes mellitus with diabetic neuropathy, unspecified: Secondary | ICD-10-CM | POA: Diagnosis not present

## 2015-12-23 DIAGNOSIS — R260 Ataxic gait: Secondary | ICD-10-CM | POA: Diagnosis not present

## 2015-12-25 DIAGNOSIS — M0589 Other rheumatoid arthritis with rheumatoid factor of multiple sites: Secondary | ICD-10-CM | POA: Diagnosis not present

## 2015-12-25 DIAGNOSIS — Z79899 Other long term (current) drug therapy: Secondary | ICD-10-CM | POA: Diagnosis not present

## 2016-01-01 DIAGNOSIS — G40211 Localization-related (focal) (partial) symptomatic epilepsy and epileptic syndromes with complex partial seizures, intractable, with status epilepticus: Secondary | ICD-10-CM | POA: Diagnosis not present

## 2016-01-07 DIAGNOSIS — R260 Ataxic gait: Secondary | ICD-10-CM | POA: Diagnosis not present

## 2016-01-07 DIAGNOSIS — M130 Polyarthritis, unspecified: Secondary | ICD-10-CM | POA: Diagnosis not present

## 2016-01-07 DIAGNOSIS — G40211 Localization-related (focal) (partial) symptomatic epilepsy and epileptic syndromes with complex partial seizures, intractable, with status epilepticus: Secondary | ICD-10-CM | POA: Diagnosis not present

## 2016-01-07 DIAGNOSIS — R55 Syncope and collapse: Secondary | ICD-10-CM | POA: Diagnosis not present

## 2016-01-07 DIAGNOSIS — E876 Hypokalemia: Secondary | ICD-10-CM | POA: Diagnosis not present

## 2016-01-07 DIAGNOSIS — E1140 Type 2 diabetes mellitus with diabetic neuropathy, unspecified: Secondary | ICD-10-CM | POA: Diagnosis not present

## 2016-01-21 DIAGNOSIS — M47816 Spondylosis without myelopathy or radiculopathy, lumbar region: Secondary | ICD-10-CM | POA: Diagnosis not present

## 2016-01-21 DIAGNOSIS — M545 Low back pain: Secondary | ICD-10-CM | POA: Diagnosis not present

## 2016-01-21 DIAGNOSIS — M9903 Segmental and somatic dysfunction of lumbar region: Secondary | ICD-10-CM | POA: Diagnosis not present

## 2016-01-27 DIAGNOSIS — M9903 Segmental and somatic dysfunction of lumbar region: Secondary | ICD-10-CM | POA: Diagnosis not present

## 2016-01-27 DIAGNOSIS — M47816 Spondylosis without myelopathy or radiculopathy, lumbar region: Secondary | ICD-10-CM | POA: Diagnosis not present

## 2016-01-27 DIAGNOSIS — M545 Low back pain: Secondary | ICD-10-CM | POA: Diagnosis not present

## 2016-02-03 DIAGNOSIS — I10 Essential (primary) hypertension: Secondary | ICD-10-CM | POA: Diagnosis not present

## 2016-02-03 DIAGNOSIS — G40909 Epilepsy, unspecified, not intractable, without status epilepticus: Secondary | ICD-10-CM | POA: Diagnosis not present

## 2016-02-03 DIAGNOSIS — E118 Type 2 diabetes mellitus with unspecified complications: Secondary | ICD-10-CM | POA: Diagnosis not present

## 2016-02-03 DIAGNOSIS — Z23 Encounter for immunization: Secondary | ICD-10-CM | POA: Diagnosis not present

## 2016-02-03 DIAGNOSIS — E119 Type 2 diabetes mellitus without complications: Secondary | ICD-10-CM | POA: Diagnosis not present

## 2016-02-06 DIAGNOSIS — M47816 Spondylosis without myelopathy or radiculopathy, lumbar region: Secondary | ICD-10-CM | POA: Diagnosis not present

## 2016-02-06 DIAGNOSIS — M545 Low back pain: Secondary | ICD-10-CM | POA: Diagnosis not present

## 2016-02-06 DIAGNOSIS — M9903 Segmental and somatic dysfunction of lumbar region: Secondary | ICD-10-CM | POA: Diagnosis not present

## 2016-02-17 DIAGNOSIS — M0589 Other rheumatoid arthritis with rheumatoid factor of multiple sites: Secondary | ICD-10-CM | POA: Diagnosis not present

## 2016-02-17 DIAGNOSIS — Z79899 Other long term (current) drug therapy: Secondary | ICD-10-CM | POA: Diagnosis not present

## 2016-02-19 DIAGNOSIS — E119 Type 2 diabetes mellitus without complications: Secondary | ICD-10-CM | POA: Diagnosis not present

## 2016-02-19 DIAGNOSIS — Z961 Presence of intraocular lens: Secondary | ICD-10-CM | POA: Diagnosis not present

## 2016-02-19 DIAGNOSIS — H26491 Other secondary cataract, right eye: Secondary | ICD-10-CM | POA: Diagnosis not present

## 2016-02-19 DIAGNOSIS — H40013 Open angle with borderline findings, low risk, bilateral: Secondary | ICD-10-CM | POA: Diagnosis not present

## 2016-02-23 DIAGNOSIS — M549 Dorsalgia, unspecified: Secondary | ICD-10-CM | POA: Diagnosis not present

## 2016-02-23 DIAGNOSIS — M542 Cervicalgia: Secondary | ICD-10-CM | POA: Diagnosis not present

## 2016-02-24 ENCOUNTER — Ambulatory Visit (HOSPITAL_COMMUNITY)
Admission: RE | Admit: 2016-02-24 | Discharge: 2016-02-24 | Disposition: A | Discharge status: Home / Self Care | Payer: Medicare Other | Source: Ambulatory Visit | Attending: Family Medicine | Admitting: Family Medicine

## 2016-02-24 ENCOUNTER — Other Ambulatory Visit (HOSPITAL_COMMUNITY): Payer: Self-pay | Admitting: Family Medicine

## 2016-02-24 DIAGNOSIS — M545 Low back pain: Secondary | ICD-10-CM | POA: Diagnosis not present

## 2016-02-24 DIAGNOSIS — G40211 Localization-related (focal) (partial) symptomatic epilepsy and epileptic syndromes with complex partial seizures, intractable, with status epilepticus: Secondary | ICD-10-CM | POA: Diagnosis not present

## 2016-02-24 DIAGNOSIS — E1140 Type 2 diabetes mellitus with diabetic neuropathy, unspecified: Secondary | ICD-10-CM | POA: Diagnosis not present

## 2016-02-24 DIAGNOSIS — M5441 Lumbago with sciatica, right side: Secondary | ICD-10-CM

## 2016-02-24 DIAGNOSIS — E876 Hypokalemia: Secondary | ICD-10-CM | POA: Diagnosis not present

## 2016-02-24 DIAGNOSIS — M130 Polyarthritis, unspecified: Secondary | ICD-10-CM | POA: Diagnosis not present

## 2016-02-24 DIAGNOSIS — R260 Ataxic gait: Secondary | ICD-10-CM | POA: Diagnosis not present

## 2016-02-24 DIAGNOSIS — M4316 Spondylolisthesis, lumbar region: Secondary | ICD-10-CM | POA: Diagnosis not present

## 2016-02-24 DIAGNOSIS — R55 Syncope and collapse: Secondary | ICD-10-CM | POA: Diagnosis not present

## 2016-03-02 DIAGNOSIS — M542 Cervicalgia: Secondary | ICD-10-CM | POA: Diagnosis not present

## 2016-03-02 DIAGNOSIS — I10 Essential (primary) hypertension: Secondary | ICD-10-CM | POA: Diagnosis not present

## 2016-03-02 DIAGNOSIS — E119 Type 2 diabetes mellitus without complications: Secondary | ICD-10-CM | POA: Diagnosis not present

## 2016-03-23 DIAGNOSIS — M069 Rheumatoid arthritis, unspecified: Secondary | ICD-10-CM | POA: Diagnosis not present

## 2016-03-23 DIAGNOSIS — R569 Unspecified convulsions: Secondary | ICD-10-CM | POA: Diagnosis not present

## 2016-03-30 DIAGNOSIS — E118 Type 2 diabetes mellitus with unspecified complications: Secondary | ICD-10-CM | POA: Diagnosis not present

## 2016-03-30 DIAGNOSIS — M545 Low back pain: Secondary | ICD-10-CM | POA: Diagnosis not present

## 2016-04-06 DIAGNOSIS — M0589 Other rheumatoid arthritis with rheumatoid factor of multiple sites: Secondary | ICD-10-CM | POA: Diagnosis not present

## 2016-04-06 DIAGNOSIS — M5136 Other intervertebral disc degeneration, lumbar region: Secondary | ICD-10-CM | POA: Diagnosis not present

## 2016-04-06 DIAGNOSIS — M150 Primary generalized (osteo)arthritis: Secondary | ICD-10-CM | POA: Diagnosis not present

## 2016-04-06 DIAGNOSIS — Z6834 Body mass index (BMI) 34.0-34.9, adult: Secondary | ICD-10-CM | POA: Diagnosis not present

## 2016-04-06 DIAGNOSIS — E669 Obesity, unspecified: Secondary | ICD-10-CM | POA: Diagnosis not present

## 2016-04-06 DIAGNOSIS — M06341 Rheumatoid nodule, right hand: Secondary | ICD-10-CM | POA: Diagnosis not present

## 2016-04-13 DIAGNOSIS — M0589 Other rheumatoid arthritis with rheumatoid factor of multiple sites: Secondary | ICD-10-CM | POA: Diagnosis not present

## 2016-04-13 DIAGNOSIS — Z79899 Other long term (current) drug therapy: Secondary | ICD-10-CM | POA: Diagnosis not present

## 2016-04-20 DIAGNOSIS — M9903 Segmental and somatic dysfunction of lumbar region: Secondary | ICD-10-CM | POA: Diagnosis not present

## 2016-04-20 DIAGNOSIS — M545 Low back pain: Secondary | ICD-10-CM | POA: Diagnosis not present

## 2016-04-20 DIAGNOSIS — M47816 Spondylosis without myelopathy or radiculopathy, lumbar region: Secondary | ICD-10-CM | POA: Diagnosis not present

## 2016-04-22 DIAGNOSIS — M545 Low back pain: Secondary | ICD-10-CM | POA: Diagnosis not present

## 2016-04-22 DIAGNOSIS — M47816 Spondylosis without myelopathy or radiculopathy, lumbar region: Secondary | ICD-10-CM | POA: Diagnosis not present

## 2016-04-22 DIAGNOSIS — M9903 Segmental and somatic dysfunction of lumbar region: Secondary | ICD-10-CM | POA: Diagnosis not present

## 2016-04-26 DIAGNOSIS — K625 Hemorrhage of anus and rectum: Secondary | ICD-10-CM | POA: Diagnosis not present

## 2016-04-27 DIAGNOSIS — Z85038 Personal history of other malignant neoplasm of large intestine: Secondary | ICD-10-CM | POA: Diagnosis not present

## 2016-04-27 DIAGNOSIS — K625 Hemorrhage of anus and rectum: Secondary | ICD-10-CM | POA: Diagnosis not present

## 2016-04-27 DIAGNOSIS — K5904 Chronic idiopathic constipation: Secondary | ICD-10-CM | POA: Diagnosis not present

## 2016-04-27 DIAGNOSIS — Z1211 Encounter for screening for malignant neoplasm of colon: Secondary | ICD-10-CM | POA: Diagnosis not present

## 2016-05-07 DIAGNOSIS — Z1211 Encounter for screening for malignant neoplasm of colon: Secondary | ICD-10-CM | POA: Diagnosis not present

## 2016-05-07 DIAGNOSIS — K625 Hemorrhage of anus and rectum: Secondary | ICD-10-CM | POA: Diagnosis not present

## 2016-05-07 DIAGNOSIS — K635 Polyp of colon: Secondary | ICD-10-CM | POA: Diagnosis not present

## 2016-05-07 DIAGNOSIS — Z85038 Personal history of other malignant neoplasm of large intestine: Secondary | ICD-10-CM | POA: Diagnosis not present

## 2016-05-07 DIAGNOSIS — D127 Benign neoplasm of rectosigmoid junction: Secondary | ICD-10-CM | POA: Diagnosis not present

## 2016-05-25 DIAGNOSIS — Z85038 Personal history of other malignant neoplasm of large intestine: Secondary | ICD-10-CM | POA: Diagnosis not present

## 2016-05-25 DIAGNOSIS — R140 Abdominal distension (gaseous): Secondary | ICD-10-CM | POA: Diagnosis not present

## 2016-05-25 DIAGNOSIS — K5904 Chronic idiopathic constipation: Secondary | ICD-10-CM | POA: Diagnosis not present

## 2016-06-24 DIAGNOSIS — R260 Ataxic gait: Secondary | ICD-10-CM | POA: Diagnosis not present

## 2016-06-24 DIAGNOSIS — R55 Syncope and collapse: Secondary | ICD-10-CM | POA: Diagnosis not present

## 2016-06-24 DIAGNOSIS — G40211 Localization-related (focal) (partial) symptomatic epilepsy and epileptic syndromes with complex partial seizures, intractable, with status epilepticus: Secondary | ICD-10-CM | POA: Diagnosis not present

## 2016-06-24 DIAGNOSIS — E1140 Type 2 diabetes mellitus with diabetic neuropathy, unspecified: Secondary | ICD-10-CM | POA: Diagnosis not present

## 2016-06-24 DIAGNOSIS — M130 Polyarthritis, unspecified: Secondary | ICD-10-CM | POA: Diagnosis not present

## 2016-06-24 DIAGNOSIS — E876 Hypokalemia: Secondary | ICD-10-CM | POA: Diagnosis not present

## 2016-06-29 DIAGNOSIS — I10 Essential (primary) hypertension: Secondary | ICD-10-CM | POA: Diagnosis not present

## 2016-06-29 DIAGNOSIS — Z0000 Encounter for general adult medical examination without abnormal findings: Secondary | ICD-10-CM | POA: Diagnosis not present

## 2016-06-29 DIAGNOSIS — E118 Type 2 diabetes mellitus with unspecified complications: Secondary | ICD-10-CM | POA: Diagnosis not present

## 2016-06-29 DIAGNOSIS — M069 Rheumatoid arthritis, unspecified: Secondary | ICD-10-CM | POA: Diagnosis not present

## 2016-07-01 DIAGNOSIS — M0589 Other rheumatoid arthritis with rheumatoid factor of multiple sites: Secondary | ICD-10-CM | POA: Diagnosis not present

## 2016-07-08 DIAGNOSIS — E669 Obesity, unspecified: Secondary | ICD-10-CM | POA: Diagnosis not present

## 2016-07-08 DIAGNOSIS — M0589 Other rheumatoid arthritis with rheumatoid factor of multiple sites: Secondary | ICD-10-CM | POA: Diagnosis not present

## 2016-07-08 DIAGNOSIS — M150 Primary generalized (osteo)arthritis: Secondary | ICD-10-CM | POA: Diagnosis not present

## 2016-07-08 DIAGNOSIS — Z6836 Body mass index (BMI) 36.0-36.9, adult: Secondary | ICD-10-CM | POA: Diagnosis not present

## 2016-07-08 DIAGNOSIS — M5136 Other intervertebral disc degeneration, lumbar region: Secondary | ICD-10-CM | POA: Diagnosis not present

## 2016-07-08 DIAGNOSIS — M06341 Rheumatoid nodule, right hand: Secondary | ICD-10-CM | POA: Diagnosis not present

## 2016-08-20 ENCOUNTER — Ambulatory Visit (HOSPITAL_COMMUNITY): Payer: Medicare Other | Admitting: Hematology & Oncology

## 2016-08-20 ENCOUNTER — Other Ambulatory Visit (HOSPITAL_COMMUNITY): Payer: Medicare Other

## 2016-08-23 ENCOUNTER — Other Ambulatory Visit (HOSPITAL_COMMUNITY): Payer: Medicare Other

## 2016-08-23 ENCOUNTER — Ambulatory Visit (HOSPITAL_COMMUNITY): Payer: Medicare Other | Admitting: Oncology

## 2016-08-26 DIAGNOSIS — Z79899 Other long term (current) drug therapy: Secondary | ICD-10-CM | POA: Diagnosis not present

## 2016-08-26 DIAGNOSIS — M0589 Other rheumatoid arthritis with rheumatoid factor of multiple sites: Secondary | ICD-10-CM | POA: Diagnosis not present

## 2016-08-30 ENCOUNTER — Other Ambulatory Visit (HOSPITAL_COMMUNITY): Payer: Medicare Other

## 2016-08-30 ENCOUNTER — Ambulatory Visit (HOSPITAL_COMMUNITY): Payer: Medicare Other | Admitting: Oncology

## 2016-09-08 ENCOUNTER — Ambulatory Visit (HOSPITAL_COMMUNITY): Payer: Medicare Other | Admitting: Oncology

## 2016-09-08 ENCOUNTER — Other Ambulatory Visit (HOSPITAL_COMMUNITY): Payer: Medicare Other

## 2016-09-08 ENCOUNTER — Ambulatory Visit (HOSPITAL_COMMUNITY): Payer: Medicare Other

## 2016-09-14 ENCOUNTER — Encounter (HOSPITAL_COMMUNITY): Payer: Medicare Other | Attending: Oncology

## 2016-09-14 ENCOUNTER — Encounter (HOSPITAL_BASED_OUTPATIENT_CLINIC_OR_DEPARTMENT_OTHER): Payer: Medicare Other | Admitting: Oncology

## 2016-09-14 ENCOUNTER — Encounter (HOSPITAL_COMMUNITY): Payer: Self-pay | Admitting: Oncology

## 2016-09-14 VITALS — BP 144/84 | HR 64 | Temp 98.7°F | Resp 18 | Wt 227.3 lb

## 2016-09-14 DIAGNOSIS — Z7984 Long term (current) use of oral hypoglycemic drugs: Secondary | ICD-10-CM | POA: Insufficient documentation

## 2016-09-14 DIAGNOSIS — Z9221 Personal history of antineoplastic chemotherapy: Secondary | ICD-10-CM | POA: Diagnosis not present

## 2016-09-14 DIAGNOSIS — I10 Essential (primary) hypertension: Secondary | ICD-10-CM | POA: Diagnosis not present

## 2016-09-14 DIAGNOSIS — C189 Malignant neoplasm of colon, unspecified: Secondary | ICD-10-CM | POA: Insufficient documentation

## 2016-09-14 DIAGNOSIS — E119 Type 2 diabetes mellitus without complications: Secondary | ICD-10-CM | POA: Diagnosis not present

## 2016-09-14 DIAGNOSIS — E669 Obesity, unspecified: Secondary | ICD-10-CM | POA: Insufficient documentation

## 2016-09-14 DIAGNOSIS — Z85038 Personal history of other malignant neoplasm of large intestine: Secondary | ICD-10-CM | POA: Diagnosis not present

## 2016-09-14 LAB — CBC WITH DIFFERENTIAL/PLATELET
BASOS ABS: 0.1 10*3/uL (ref 0.0–0.1)
BASOS PCT: 1 %
EOS ABS: 0.4 10*3/uL (ref 0.0–0.7)
Eosinophils Relative: 4 %
HCT: 35.4 % — ABNORMAL LOW (ref 36.0–46.0)
HEMOGLOBIN: 12.3 g/dL (ref 12.0–15.0)
Lymphocytes Relative: 34 %
Lymphs Abs: 3.5 10*3/uL (ref 0.7–4.0)
MCH: 27.2 pg (ref 26.0–34.0)
MCHC: 34.7 g/dL (ref 30.0–36.0)
MCV: 78.1 fL (ref 78.0–100.0)
MONOS PCT: 6 %
Monocytes Absolute: 0.6 10*3/uL (ref 0.1–1.0)
Neutro Abs: 5.6 10*3/uL (ref 1.7–7.7)
Neutrophils Relative %: 55 %
Platelets: 218 10*3/uL (ref 150–400)
RBC: 4.53 MIL/uL (ref 3.87–5.11)
RDW: 13.2 % (ref 11.5–15.5)
WBC: 10.1 10*3/uL (ref 4.0–10.5)

## 2016-09-14 LAB — COMPREHENSIVE METABOLIC PANEL
ALBUMIN: 3.5 g/dL (ref 3.5–5.0)
ALK PHOS: 81 U/L (ref 38–126)
ALT: 12 U/L — ABNORMAL LOW (ref 14–54)
ANION GAP: 7 (ref 5–15)
AST: 17 U/L (ref 15–41)
BUN: 15 mg/dL (ref 6–20)
CO2: 30 mmol/L (ref 22–32)
Calcium: 8.6 mg/dL — ABNORMAL LOW (ref 8.9–10.3)
Chloride: 101 mmol/L (ref 101–111)
Creatinine, Ser: 0.8 mg/dL (ref 0.44–1.00)
GFR calc Af Amer: >60 mL/min (ref >60)
GFR calc non Af Amer: >60 mL/min (ref >60)
GLUCOSE: 163 mg/dL — AB (ref 65–99)
Potassium: 3.4 mmol/L — ABNORMAL LOW (ref 3.5–5.1)
SODIUM: 138 mmol/L (ref 135–145)
TOTAL PROTEIN: 6.8 g/dL (ref 6.5–8.1)
Total Bilirubin: 0.8 mg/dL (ref 0.3–1.2)
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
## 2016-09-14 NOTE — Patient Instructions (Signed)
[place] at [facility] Discharge Instructions  RECOMMENDATIONS MADE BY THE CONSULTANT AND ANY TEST RESULTS WILL BE SENT TO YOUR REFERRING PHYSICIAN.  You were seen today by Dr. Twana First Follow up in 1 year with lab work   Thank you for choosing [place] at [facility] to provide your oncology and hematology care.  To afford each patient quality time with our provider, please arrive at least 15 minutes before your scheduled appointment time.    If you have a lab appointment with the [facility] please come in thru the  Main Entrance and check in at the main information desk  You need to re-schedule your appointment should you arrive 10 or more minutes late.  We strive to give you quality time with our providers, and arriving late affects you and other patients whose appointments are after yours.  Also, if you no show three or more times for appointments you may be dismissed from the clinic at the providers discretion.     Again, thank you for choosing [facility].  Our hope is that these requests will decrease the amount of time that you wait before being seen by our physicians.       _____________________________________________________________  Should you have questions after your visit to [facility], please contact our office at [phone] between the hours of 8:30 a.m. and 4:30 p.m.  Voicemails left after 4:30 p.m. will not be returned until the following business day.  For prescription refill requests, have your pharmacy contact our office.       Resources For Cancer Patients and their Caregivers ? American Cancer Society: Can assist with transportation, wigs, general needs, runs Look Good Feel Better.        336-835-0787 ? Cancer Care: Provides financial assistance, online support groups, medication/co-pay assistance.  1-800-813-HOPE 571-880-1017) ? [place] Assists  [place] cancer patients and their families through emotional , educational and financial support.  (802)381-6456 ? [place] DSS Where to apply for food stamps, Medicaid and utility assistance. (762)614-7797 ? [facility]: Transportation to medical appointments. 913-695-6215 ? Social Security Administration: May apply for disability if have a Stage IV cancer. 602 876 6289 763-817-8492 ? [employer], Disability and Transit Services: Assists with nutrition, care and transit needs. [place] Support Programs: @10RELATIVEDAYS @ > Cancer Support Group  2nd Tuesday of the month 1pm-2pm, Journey Room  > Creative Journey  3rd Tuesday of the month 1130am-1pm, Journey Room  > Look Good Feel Better  1st Wednesday of the month 10am-12 noon, Journey Room (Call [place] to register [phone])

## 2016-09-15 LAB — CEA: CEA: 1.8 ng/mL (ref 0.0–4.7)

## 2016-09-21 DIAGNOSIS — Z6837 Body mass index (BMI) 37.0-37.9, adult: Secondary | ICD-10-CM | POA: Diagnosis not present

## 2016-09-21 DIAGNOSIS — E669 Obesity, unspecified: Secondary | ICD-10-CM | POA: Diagnosis not present

## 2016-09-21 DIAGNOSIS — M0589 Other rheumatoid arthritis with rheumatoid factor of multiple sites: Secondary | ICD-10-CM | POA: Diagnosis not present

## 2016-09-21 DIAGNOSIS — M5136 Other intervertebral disc degeneration, lumbar region: Secondary | ICD-10-CM | POA: Diagnosis not present

## 2016-09-21 DIAGNOSIS — M150 Primary generalized (osteo)arthritis: Secondary | ICD-10-CM | POA: Diagnosis not present

## 2016-09-21 DIAGNOSIS — M06341 Rheumatoid nodule, right hand: Secondary | ICD-10-CM | POA: Diagnosis not present

## 2016-09-28 DIAGNOSIS — M069 Rheumatoid arthritis, unspecified: Secondary | ICD-10-CM | POA: Diagnosis not present

## 2016-09-28 DIAGNOSIS — E118 Type 2 diabetes mellitus with unspecified complications: Secondary | ICD-10-CM | POA: Diagnosis not present

## 2016-09-28 DIAGNOSIS — Z6834 Body mass index (BMI) 34.0-34.9, adult: Secondary | ICD-10-CM | POA: Diagnosis not present

## 2016-09-28 DIAGNOSIS — I10 Essential (primary) hypertension: Secondary | ICD-10-CM | POA: Diagnosis not present

## 2016-09-30 ENCOUNTER — Other Ambulatory Visit (HOSPITAL_COMMUNITY): Payer: Self-pay | Admitting: Family Medicine

## 2016-09-30 DIAGNOSIS — Z1231 Encounter for screening mammogram for malignant neoplasm of breast: Secondary | ICD-10-CM

## 2016-10-14 DIAGNOSIS — M150 Primary generalized (osteo)arthritis: Secondary | ICD-10-CM | POA: Diagnosis not present

## 2016-10-14 DIAGNOSIS — M06341 Rheumatoid nodule, right hand: Secondary | ICD-10-CM | POA: Diagnosis not present

## 2016-10-14 DIAGNOSIS — M5136 Other intervertebral disc degeneration, lumbar region: Secondary | ICD-10-CM | POA: Diagnosis not present

## 2016-10-14 DIAGNOSIS — E669 Obesity, unspecified: Secondary | ICD-10-CM | POA: Diagnosis not present

## 2016-10-14 DIAGNOSIS — M0589 Other rheumatoid arthritis with rheumatoid factor of multiple sites: Secondary | ICD-10-CM | POA: Diagnosis not present

## 2016-10-14 DIAGNOSIS — Z6836 Body mass index (BMI) 36.0-36.9, adult: Secondary | ICD-10-CM | POA: Diagnosis not present

## 2016-10-21 DIAGNOSIS — M0589 Other rheumatoid arthritis with rheumatoid factor of multiple sites: Secondary | ICD-10-CM | POA: Diagnosis not present

## 2016-10-28 ENCOUNTER — Ambulatory Visit (HOSPITAL_COMMUNITY)
Admission: RE | Admit: 2016-10-28 | Discharge: 2016-10-28 | Disposition: A | Discharge status: Home / Self Care | Payer: Medicare Other | Source: Ambulatory Visit | Attending: Adult Health | Admitting: Adult Health

## 2016-10-28 DIAGNOSIS — Z1231 Encounter for screening mammogram for malignant neoplasm of breast: Secondary | ICD-10-CM | POA: Diagnosis not present

## 2016-11-17 DIAGNOSIS — M9903 Segmental and somatic dysfunction of lumbar region: Secondary | ICD-10-CM | POA: Diagnosis not present

## 2016-11-17 DIAGNOSIS — M545 Low back pain: Secondary | ICD-10-CM | POA: Diagnosis not present

## 2016-11-17 DIAGNOSIS — M47816 Spondylosis without myelopathy or radiculopathy, lumbar region: Secondary | ICD-10-CM | POA: Diagnosis not present

## 2016-11-18 DIAGNOSIS — M545 Low back pain: Secondary | ICD-10-CM | POA: Diagnosis not present

## 2016-11-18 DIAGNOSIS — M47816 Spondylosis without myelopathy or radiculopathy, lumbar region: Secondary | ICD-10-CM | POA: Diagnosis not present

## 2016-11-18 DIAGNOSIS — M9903 Segmental and somatic dysfunction of lumbar region: Secondary | ICD-10-CM | POA: Diagnosis not present

## 2016-11-22 DIAGNOSIS — M47816 Spondylosis without myelopathy or radiculopathy, lumbar region: Secondary | ICD-10-CM | POA: Diagnosis not present

## 2016-11-22 DIAGNOSIS — M9903 Segmental and somatic dysfunction of lumbar region: Secondary | ICD-10-CM | POA: Diagnosis not present

## 2016-11-22 DIAGNOSIS — M545 Low back pain: Secondary | ICD-10-CM | POA: Diagnosis not present

## 2016-11-24 DIAGNOSIS — M545 Low back pain: Secondary | ICD-10-CM | POA: Diagnosis not present

## 2016-11-24 DIAGNOSIS — M9903 Segmental and somatic dysfunction of lumbar region: Secondary | ICD-10-CM | POA: Diagnosis not present

## 2016-11-24 DIAGNOSIS — M47816 Spondylosis without myelopathy or radiculopathy, lumbar region: Secondary | ICD-10-CM | POA: Diagnosis not present

## 2016-11-29 DIAGNOSIS — M47816 Spondylosis without myelopathy or radiculopathy, lumbar region: Secondary | ICD-10-CM | POA: Diagnosis not present

## 2016-11-29 DIAGNOSIS — M9903 Segmental and somatic dysfunction of lumbar region: Secondary | ICD-10-CM | POA: Diagnosis not present

## 2016-11-29 DIAGNOSIS — M545 Low back pain: Secondary | ICD-10-CM | POA: Diagnosis not present

## 2016-12-06 DIAGNOSIS — M47816 Spondylosis without myelopathy or radiculopathy, lumbar region: Secondary | ICD-10-CM | POA: Diagnosis not present

## 2016-12-06 DIAGNOSIS — M9903 Segmental and somatic dysfunction of lumbar region: Secondary | ICD-10-CM | POA: Diagnosis not present

## 2016-12-06 DIAGNOSIS — M545 Low back pain: Secondary | ICD-10-CM | POA: Diagnosis not present

## 2016-12-13 DIAGNOSIS — M150 Primary generalized (osteo)arthritis: Secondary | ICD-10-CM | POA: Diagnosis not present

## 2016-12-13 DIAGNOSIS — M06341 Rheumatoid nodule, right hand: Secondary | ICD-10-CM | POA: Diagnosis not present

## 2016-12-13 DIAGNOSIS — M0589 Other rheumatoid arthritis with rheumatoid factor of multiple sites: Secondary | ICD-10-CM | POA: Diagnosis not present

## 2016-12-13 DIAGNOSIS — M5136 Other intervertebral disc degeneration, lumbar region: Secondary | ICD-10-CM | POA: Diagnosis not present

## 2016-12-13 DIAGNOSIS — Z6837 Body mass index (BMI) 37.0-37.9, adult: Secondary | ICD-10-CM | POA: Diagnosis not present

## 2016-12-13 DIAGNOSIS — E669 Obesity, unspecified: Secondary | ICD-10-CM | POA: Diagnosis not present

## 2016-12-16 DIAGNOSIS — M0589 Other rheumatoid arthritis with rheumatoid factor of multiple sites: Secondary | ICD-10-CM | POA: Diagnosis not present

## 2016-12-16 DIAGNOSIS — Z79899 Other long term (current) drug therapy: Secondary | ICD-10-CM | POA: Diagnosis not present

## 2016-12-19 DIAGNOSIS — Z7902 Long term (current) use of antithrombotics/antiplatelets: Secondary | ICD-10-CM | POA: Diagnosis not present

## 2016-12-19 DIAGNOSIS — I25118 Atherosclerotic heart disease of native coronary artery with other forms of angina pectoris: Secondary | ICD-10-CM | POA: Diagnosis not present

## 2016-12-19 DIAGNOSIS — Z79899 Other long term (current) drug therapy: Secondary | ICD-10-CM | POA: Diagnosis not present

## 2016-12-19 DIAGNOSIS — Z1379 Encounter for other screening for genetic and chromosomal anomalies: Secondary | ICD-10-CM | POA: Diagnosis not present

## 2016-12-28 DIAGNOSIS — M069 Rheumatoid arthritis, unspecified: Secondary | ICD-10-CM | POA: Diagnosis not present

## 2016-12-28 DIAGNOSIS — G40909 Epilepsy, unspecified, not intractable, without status epilepticus: Secondary | ICD-10-CM | POA: Diagnosis not present

## 2016-12-28 DIAGNOSIS — I2510 Atherosclerotic heart disease of native coronary artery without angina pectoris: Secondary | ICD-10-CM | POA: Diagnosis not present

## 2016-12-28 DIAGNOSIS — I10 Essential (primary) hypertension: Secondary | ICD-10-CM | POA: Diagnosis not present

## 2016-12-28 DIAGNOSIS — E118 Type 2 diabetes mellitus with unspecified complications: Secondary | ICD-10-CM | POA: Diagnosis not present

## 2016-12-30 DIAGNOSIS — M0589 Other rheumatoid arthritis with rheumatoid factor of multiple sites: Secondary | ICD-10-CM | POA: Diagnosis not present

## 2017-01-25 DIAGNOSIS — M0589 Other rheumatoid arthritis with rheumatoid factor of multiple sites: Secondary | ICD-10-CM | POA: Diagnosis not present

## 2017-01-31 DIAGNOSIS — M9903 Segmental and somatic dysfunction of lumbar region: Secondary | ICD-10-CM | POA: Diagnosis not present

## 2017-01-31 DIAGNOSIS — M545 Low back pain: Secondary | ICD-10-CM | POA: Diagnosis not present

## 2017-01-31 DIAGNOSIS — M47816 Spondylosis without myelopathy or radiculopathy, lumbar region: Secondary | ICD-10-CM | POA: Diagnosis not present

## 2017-02-01 DIAGNOSIS — M9903 Segmental and somatic dysfunction of lumbar region: Secondary | ICD-10-CM | POA: Diagnosis not present

## 2017-02-01 DIAGNOSIS — M545 Low back pain: Secondary | ICD-10-CM | POA: Diagnosis not present

## 2017-02-01 DIAGNOSIS — M47816 Spondylosis without myelopathy or radiculopathy, lumbar region: Secondary | ICD-10-CM | POA: Diagnosis not present

## 2017-02-03 DIAGNOSIS — M545 Low back pain: Secondary | ICD-10-CM | POA: Diagnosis not present

## 2017-02-03 DIAGNOSIS — M47816 Spondylosis without myelopathy or radiculopathy, lumbar region: Secondary | ICD-10-CM | POA: Diagnosis not present

## 2017-02-03 DIAGNOSIS — M9903 Segmental and somatic dysfunction of lumbar region: Secondary | ICD-10-CM | POA: Diagnosis not present

## 2017-02-04 DIAGNOSIS — Z23 Encounter for immunization: Secondary | ICD-10-CM | POA: Diagnosis not present

## 2017-02-07 DIAGNOSIS — M9903 Segmental and somatic dysfunction of lumbar region: Secondary | ICD-10-CM | POA: Diagnosis not present

## 2017-02-07 DIAGNOSIS — M545 Low back pain: Secondary | ICD-10-CM | POA: Diagnosis not present

## 2017-02-07 DIAGNOSIS — M47816 Spondylosis without myelopathy or radiculopathy, lumbar region: Secondary | ICD-10-CM | POA: Diagnosis not present

## 2017-02-10 DIAGNOSIS — M9903 Segmental and somatic dysfunction of lumbar region: Secondary | ICD-10-CM | POA: Diagnosis not present

## 2017-02-10 DIAGNOSIS — M47816 Spondylosis without myelopathy or radiculopathy, lumbar region: Secondary | ICD-10-CM | POA: Diagnosis not present

## 2017-02-10 DIAGNOSIS — M545 Low back pain: Secondary | ICD-10-CM | POA: Diagnosis not present

## 2017-02-11 DIAGNOSIS — C188 Malignant neoplasm of overlapping sites of colon: Secondary | ICD-10-CM | POA: Diagnosis not present

## 2017-02-11 DIAGNOSIS — R195 Other fecal abnormalities: Secondary | ICD-10-CM | POA: Diagnosis not present

## 2017-02-11 DIAGNOSIS — Z8042 Family history of malignant neoplasm of prostate: Secondary | ICD-10-CM | POA: Diagnosis not present

## 2017-02-11 DIAGNOSIS — Z8041 Family history of malignant neoplasm of ovary: Secondary | ICD-10-CM | POA: Diagnosis not present

## 2017-02-11 DIAGNOSIS — K5900 Constipation, unspecified: Secondary | ICD-10-CM | POA: Diagnosis not present

## 2017-02-11 DIAGNOSIS — Z803 Family history of malignant neoplasm of breast: Secondary | ICD-10-CM | POA: Diagnosis not present

## 2017-02-11 DIAGNOSIS — R141 Gas pain: Secondary | ICD-10-CM | POA: Diagnosis not present

## 2017-02-11 DIAGNOSIS — Z85038 Personal history of other malignant neoplasm of large intestine: Secondary | ICD-10-CM | POA: Diagnosis not present

## 2017-02-11 DIAGNOSIS — Z808 Family history of malignant neoplasm of other organs or systems: Secondary | ICD-10-CM | POA: Diagnosis not present

## 2017-02-14 DIAGNOSIS — M47816 Spondylosis without myelopathy or radiculopathy, lumbar region: Secondary | ICD-10-CM | POA: Diagnosis not present

## 2017-02-14 DIAGNOSIS — M545 Low back pain: Secondary | ICD-10-CM | POA: Diagnosis not present

## 2017-02-14 DIAGNOSIS — M9903 Segmental and somatic dysfunction of lumbar region: Secondary | ICD-10-CM | POA: Diagnosis not present

## 2017-02-22 DIAGNOSIS — M0589 Other rheumatoid arthritis with rheumatoid factor of multiple sites: Secondary | ICD-10-CM | POA: Diagnosis not present

## 2017-03-03 DIAGNOSIS — I10 Essential (primary) hypertension: Secondary | ICD-10-CM | POA: Diagnosis not present

## 2017-03-03 DIAGNOSIS — I25118 Atherosclerotic heart disease of native coronary artery with other forms of angina pectoris: Secondary | ICD-10-CM | POA: Diagnosis not present

## 2017-03-15 DIAGNOSIS — M779 Enthesopathy, unspecified: Secondary | ICD-10-CM | POA: Diagnosis not present

## 2017-03-15 DIAGNOSIS — M2041 Other hammer toe(s) (acquired), right foot: Secondary | ICD-10-CM | POA: Diagnosis not present

## 2017-03-15 DIAGNOSIS — M79674 Pain in right toe(s): Secondary | ICD-10-CM | POA: Diagnosis not present

## 2017-03-17 DIAGNOSIS — Z6836 Body mass index (BMI) 36.0-36.9, adult: Secondary | ICD-10-CM | POA: Diagnosis not present

## 2017-03-17 DIAGNOSIS — E669 Obesity, unspecified: Secondary | ICD-10-CM | POA: Diagnosis not present

## 2017-03-17 DIAGNOSIS — M5136 Other intervertebral disc degeneration, lumbar region: Secondary | ICD-10-CM | POA: Diagnosis not present

## 2017-03-17 DIAGNOSIS — M06341 Rheumatoid nodule, right hand: Secondary | ICD-10-CM | POA: Diagnosis not present

## 2017-03-17 DIAGNOSIS — M0589 Other rheumatoid arthritis with rheumatoid factor of multiple sites: Secondary | ICD-10-CM | POA: Diagnosis not present

## 2017-03-17 DIAGNOSIS — M150 Primary generalized (osteo)arthritis: Secondary | ICD-10-CM | POA: Diagnosis not present

## 2017-03-18 DIAGNOSIS — R0602 Shortness of breath: Secondary | ICD-10-CM | POA: Diagnosis not present

## 2017-03-18 DIAGNOSIS — I10 Essential (primary) hypertension: Secondary | ICD-10-CM | POA: Diagnosis not present

## 2017-03-18 DIAGNOSIS — E119 Type 2 diabetes mellitus without complications: Secondary | ICD-10-CM | POA: Diagnosis not present

## 2017-03-18 DIAGNOSIS — I2510 Atherosclerotic heart disease of native coronary artery without angina pectoris: Secondary | ICD-10-CM | POA: Diagnosis not present

## 2017-03-22 DIAGNOSIS — Z79899 Other long term (current) drug therapy: Secondary | ICD-10-CM | POA: Diagnosis not present

## 2017-03-22 DIAGNOSIS — M0589 Other rheumatoid arthritis with rheumatoid factor of multiple sites: Secondary | ICD-10-CM | POA: Diagnosis not present

## 2017-03-29 DIAGNOSIS — I10 Essential (primary) hypertension: Secondary | ICD-10-CM | POA: Diagnosis not present

## 2017-03-29 DIAGNOSIS — M069 Rheumatoid arthritis, unspecified: Secondary | ICD-10-CM | POA: Diagnosis not present

## 2017-03-29 DIAGNOSIS — G40909 Epilepsy, unspecified, not intractable, without status epilepticus: Secondary | ICD-10-CM | POA: Diagnosis not present

## 2017-03-29 DIAGNOSIS — E118 Type 2 diabetes mellitus with unspecified complications: Secondary | ICD-10-CM | POA: Diagnosis not present

## 2017-04-05 DIAGNOSIS — I25118 Atherosclerotic heart disease of native coronary artery with other forms of angina pectoris: Secondary | ICD-10-CM | POA: Diagnosis not present

## 2017-04-05 DIAGNOSIS — R0602 Shortness of breath: Secondary | ICD-10-CM | POA: Diagnosis not present

## 2017-04-06 DIAGNOSIS — R9439 Abnormal result of other cardiovascular function study: Secondary | ICD-10-CM

## 2017-04-06 NOTE — Progress Notes (Signed)
Labs 04/04/2017: Glucose 124.  BUN/creatinine 9/0.7.  EGFR 91 Sodium 143, potassium 3.7. H/H 12/36.  MCV 78 mildly low.  Platelets 275. INR 1.1

## 2017-04-06 NOTE — H&P (Signed)
Melissa Reynolds 03/06/2017 8:13 AM Location: [facility] Patient #: [id number] DOB: Mar 13, 1941 Widowed / Language: Cleophus Molt / Race: Black or African American Female   History of Present Illness Melissa Reynolds; 03/06/2017 12:01 PM) Patient words: NP for established care.  The patient is a 76 year old female who presents with coronary artery disease. History referred for evaluation of coronary artery disease by [name] M.D.  Very pleasant 77 year old African-American female who lives in [place] with her younger sister. She has hypertension, type II diabetes mellitus was controlled, history of colon cancer status post surgery in 2001 followed by chemotherapy for a year, in remission since then with CEA levels around 0.1 with regular follow-up with Dr. Tyrone Sage, rheumatoid arthritis and osteoarthritis limiting his activity with regular follow-ups with Dr. Leigh Aurora, history syncope in 2017 with EEG documented seizure disorder, on Keppra and follow-up with Dr. Trey Sailors.  She is fairly active and loves cooking. She enjoys her life and spending time in the community. However, she has noticed limitation in her physical activity both due to her osteoarthritis as well as due to shortness of breath. She gets short of breath with going up a flight of stairs, which is gradually worsened for her. She had cardiac catheterization in 2002 for chest pain which showed 25% ostial and proximal LAD stenosis, proximal RCA 50% stenosis, mid artery 20% stenosis. She had a normal myocardial perfusion imaging testing in 2015.    Problem List/Past Medical Frances Furbish Johnson; 2017/03/06 10:48 AM) Laboratory examination (Z01.89)  Benign essential hypertension (I10)  Seizure (R56.9)  Controlled type 2 diabetes mellitus without complication, with long-term current use of insulin (E11.9)  Colon cancer (C18.9)   Allergies Frances Furbish Johnson; 03/06/17 10:48 AM) No Known Drug Allergies  [03-06-17]:  Family History Cheri Kearns; 2017/03/06 10:53 AM) Mother  Deceased. at age 28; MI (first one age 74) Father  Deceased. at age 55; bone cancer Sister 5  7 total; only 1 living; deceased; one had stroke, anyuresum Brother 3  older; all deceased;  Social History Cheri Kearns; Mar 06, 2017 10:55 AM) Current tobacco use  Never smoker. Non Drinker/No Alcohol Use  Marital status  Widowed. Living Situation  sister lives with her Number of Children  0.  Past Surgical History Frances Furbish Wynetta Emery; 03-06-2017 10:57 AM) caner removed from colon [2001]: Hysterectomy; Total [1974]: Knee Surgery [1993]:  Medication History Frances Furbish Johnson; 2017/03/06 11:01 AM) Isosorbide Mononitrate ER (30MG  Tablet ER 24HR, 1 Oral daily) Active. Folic Acid (1MG  Tablet, 1 Oral daily) Active. PredniSONE (5MG  Tablet, 1 Oral daily) Active. AmLODIPine Besylate (5MG  Tablet, 1 Oral two times daily) Active. Losartan Potassium-HCTZ (100-25MG  Tablet, 1 Oral daily) Active. LevETIRAcetam (500MG  Tablet, 1 Oral two times daily) Active. Aspirin (81MG  Tablet DR, 1 Oral daily) Active. MetFORMIN HCl (500MG  Tablet, 1 Oral two times daily) Active. Vitamin D3 (1000UNIT Tablet, 1 Oral daily) Active. Medications Reconciled (meds present)  Diagnostic Studies History Frances Furbish Johnson; Mar 06, 2017 10:59 AM) Colonoscopy [2018]: removed 1 polyp Echocardiogram  Normal. Sleep Study [2017]: Normal.    Review of Systems Melissa Reynolds; 03-06-2017 12:02 PM) General Not Present- Appetite Loss and Weight Gain. Respiratory Not Present- Chronic Cough and Wakes up from Sleep Wheezing or Short of Breath. Cardiovascular Present- Difficulty Breathing On Exertion, Edema and Fainting (syncopal episode in 2017. found to have seizures.). Not Present- Chest Pain, Claudications, Difficulty Breathing Lying Down and Paroxysmal Nocturnal Dyspnea. Gastrointestinal Not Present- Black, Tarry Stool and Difficulty  Swallowing. Musculoskeletal Not Present- Decreased Range of Motion and Muscle  Atrophy. Neurological Not Present- Attention Deficit. Psychiatric Not Present- Personality Changes and Suicidal Ideation. Endocrine Not Present- Cold Intolerance and Heat Intolerance. Hematology Not Present- Abnormal Bleeding. All other systems negative  Vitals Frances Furbish Johnson; 03/03/2017 10:54 AM) 03/03/2017 10:44 AM Weight: 220 lb Height: 65in Body Surface Area: 2.06 m Body Mass Index: 36.61 kg/m  Pulse: 70 (Regular)  P.OX: 97% (Room air) BP: 152/79 (Sitting, Left Arm, Standard)       Physical Exam Melissa Reynolds; 03/03/2017 12:03 PM) General Mental Status-Alert. General Appearance-Cooperative and Appears stated age. Build & Nutrition-Mildly obese.  Head and Neck Thyroid Gland Characteristics - normal size and consistency and no palpable nodules.  Chest and Lung Exam Chest and lung exam reveals -quiet, even and easy respiratory effort with no use of accessory muscles, non-tender and on auscultation, normal breath sounds, no adventitious sounds.  Cardiovascular Cardiovascular examination reveals -normal heart sounds, regular rate and rhythm with no murmurs, carotid auscultation reveals no bruits, abdominal aorta auscultation reveals no bruits and no prominent pulsation and femoral artery auscultation bilaterally reveals normal pulses, no bruits, no thrills.  Abdomen Palpation/Percussion Palpation and Percussion of the abdomen reveal - Non Tender and No hepatosplenomegaly.  Peripheral Vascular Lower Extremity Palpation - Dorsalis pedis pulse - Bilateral - 2+. Posterior tibial pulse - Bilateral - 1+. Carotid arteries - Bilateral-No Carotid bruit.  Neurologic Neurologic evaluation reveals -alert and oriented x 3 with no impairment of recent or remote memory. Motor-Grossly intact without any focal deficits.  Musculoskeletal Global Assessment Left Lower  Extremity - no deformities, masses or tenderness, no known fractures. Right Lower Extremity - no deformities, masses or tenderness, no known fractures.    Assessment & Plan Melissa Reynolds; 03/03/2017 12:08 PM) Benign essential hypertension (I10) Story: EKG 03/03/2017: Sinus rhythm 65 bpm. Normal axis. Normal conduction. Possible old inferior infarct. Low voltage precordial leads Current Plans Complete electrocardiogram (93000) Coronary artery disease of native artery of native heart with stable angina pectoris (I25.118) Current Plans Started Rosuvastatin Calcium 10MG , 1 (one) Tablet once daily, #30, 30 days starting 03/03/2017, Ref. x2. Note:. Recommendations:  Exertional dyspnea in 76 year old African-American female with hypertension, type 2 diabetes mellitus, known mild to moderate CAD on previous cath. We'll obtain echocardiogram and exercise/lexiscan nuclear stress test. continue current antihypertensive and diabetes therapy. Given her CAD, I have added 10 mg a statin. I will see her after the tests  Cc Marny Lowenstein, Reynolds Harris, Reynolds [facility]  Signed electronically by Vernell Leep, Reynolds (03/03/2017 12:08 PM)  Exercise thallium stress 03/17/2017: 1. Resting EKG: NSR. Stress EKG negative for ischemia. Occasional PVC during  entire stress testing and less at peak exercise. Exercised on Bruce protocol for 5:00 minutes and achieved 3.47 METS, stress terminated due to dizziness and hypertensive BP response. Resting 180/110 and peak 230/130 mm Hg. 2. Inferior and lateral ischemia of moserate size and moderate severity. Mildly reduced at 43% with inferior hypokinesis. Intermediate risk study.  Assessment & Plan Melissa Reynolds; 03/21/2017 11:29 AM)  Note:I discussed the results of recent nuclear stress test with the patient. She does have moderate ischemia and right coronary artery territory. Catheterization several years ago had showed 50% mid RCA  stenosis. She is on optimal medical therapy. I suggested we proceed with coronary angiogram with possible angioplasty. She asked me to discuss this with her primary care physician Dr. Berdine Addison. After consultation with Dr. Berdine Addison and he agreed with my recommendation. We will proceed with echocardiogram on 04/05/2017 and coronary angiogram after  that.  Schedule for cardiac catheterization, and possible angioplasty. We discussed regarding risks, benefits, alternatives to this including stress testing, CTA and continued medical therapy. Patient wants to proceed. Understands <1-2% risk of death, stroke, MI, urgent CABG, bleeding, infection, renal failure but not limited to these.  Cc [name], ND  Signed electronically by Vernell Leep, Reynolds (03/21/2017 11:30 AM)

## 2017-04-12 ENCOUNTER — Ambulatory Visit (HOSPITAL_COMMUNITY)
Admission: RE | Admit: 2017-04-12 | Discharge: 2017-04-12 | Disposition: A | Discharge status: Home / Self Care | Payer: Medicare Other | Source: Ambulatory Visit | Attending: Cardiology | Admitting: Cardiology

## 2017-04-12 ENCOUNTER — Encounter (HOSPITAL_COMMUNITY)
Admission: RE | Disposition: A | Discharge status: Home / Self Care | Payer: Self-pay | Source: Ambulatory Visit | Attending: Cardiology

## 2017-04-12 DIAGNOSIS — Z79899 Other long term (current) drug therapy: Secondary | ICD-10-CM | POA: Diagnosis not present

## 2017-04-12 DIAGNOSIS — Z823 Family history of stroke: Secondary | ICD-10-CM | POA: Insufficient documentation

## 2017-04-12 DIAGNOSIS — Z808 Family history of malignant neoplasm of other organs or systems: Secondary | ICD-10-CM | POA: Insufficient documentation

## 2017-04-12 DIAGNOSIS — I10 Essential (primary) hypertension: Secondary | ICD-10-CM | POA: Insufficient documentation

## 2017-04-12 DIAGNOSIS — Z7982 Long term (current) use of aspirin: Secondary | ICD-10-CM | POA: Insufficient documentation

## 2017-04-12 DIAGNOSIS — Z8249 Family history of ischemic heart disease and other diseases of the circulatory system: Secondary | ICD-10-CM | POA: Insufficient documentation

## 2017-04-12 DIAGNOSIS — R0600 Dyspnea, unspecified: Secondary | ICD-10-CM | POA: Diagnosis not present

## 2017-04-12 DIAGNOSIS — Z7984 Long term (current) use of oral hypoglycemic drugs: Secondary | ICD-10-CM | POA: Insufficient documentation

## 2017-04-12 DIAGNOSIS — E119 Type 2 diabetes mellitus without complications: Secondary | ICD-10-CM | POA: Insufficient documentation

## 2017-04-12 DIAGNOSIS — M069 Rheumatoid arthritis, unspecified: Secondary | ICD-10-CM | POA: Insufficient documentation

## 2017-04-12 DIAGNOSIS — G40909 Epilepsy, unspecified, not intractable, without status epilepticus: Secondary | ICD-10-CM | POA: Diagnosis not present

## 2017-04-12 DIAGNOSIS — R9439 Abnormal result of other cardiovascular function study: Secondary | ICD-10-CM

## 2017-04-12 DIAGNOSIS — I25119 Atherosclerotic heart disease of native coronary artery with unspecified angina pectoris: Secondary | ICD-10-CM | POA: Diagnosis not present

## 2017-04-12 DIAGNOSIS — Z85038 Personal history of other malignant neoplasm of large intestine: Secondary | ICD-10-CM | POA: Insufficient documentation

## 2017-04-12 DIAGNOSIS — R0609 Other forms of dyspnea: Secondary | ICD-10-CM | POA: Diagnosis not present

## 2017-04-12 DIAGNOSIS — I2510 Atherosclerotic heart disease of native coronary artery without angina pectoris: Secondary | ICD-10-CM | POA: Diagnosis present

## 2017-04-12 DIAGNOSIS — Z9221 Personal history of antineoplastic chemotherapy: Secondary | ICD-10-CM | POA: Insufficient documentation

## 2017-04-12 DIAGNOSIS — I420 Dilated cardiomyopathy: Secondary | ICD-10-CM | POA: Diagnosis not present

## 2017-04-12 HISTORY — PX: RIGHT/LEFT HEART CATH AND CORONARY ANGIOGRAPHY: CATH118266

## 2017-04-12 LAB — POCT I-STAT 3, VENOUS BLOOD GAS (G3P V)
Acid-Base Excess: 3 mmol/L — ABNORMAL HIGH (ref 0.0–2.0)
Bicarbonate: 28.8 mmol/L — ABNORMAL HIGH (ref 20.0–28.0)
O2 Saturation: 79 %
PCO2 VEN: 47.2 mmHg (ref 44.0–60.0)
PH VEN: 7.394 (ref 7.250–7.430)
PO2 VEN: 44 mmHg (ref 32.0–45.0)
TCO2: 30 mmol/L (ref 22–32)

## 2017-04-12 LAB — POCT I-STAT 3, ART BLOOD GAS (G3+)
Acid-Base Excess: 3 mmol/L — ABNORMAL HIGH (ref 0.0–2.0)
BICARBONATE: 27.8 mmol/L (ref 20.0–28.0)
O2 Saturation: 99 %
PCO2 ART: 43.6 mmHg (ref 32.0–48.0)
PH ART: 7.412 (ref 7.350–7.450)
TCO2: 29 mmol/L (ref 22–32)
pO2, Arterial: 150 mmHg — ABNORMAL HIGH (ref 83.0–108.0)

## 2017-04-12 LAB — GLUCOSE, CAPILLARY: GLUCOSE-CAPILLARY: 121 mg/dL — AB (ref 65–99)

## 2017-04-12 SURGERY — RIGHT/LEFT HEART CATH AND CORONARY ANGIOGRAPHY
Anesthesia: LOCAL

## 2017-04-12 MED ORDER — VERAPAMIL HCL 2.5 MG/ML IV SOLN
INTRAVENOUS | Status: AC
  Filled 2017-04-12: qty 2

## 2017-04-12 MED ORDER — ACETAMINOPHEN 325 MG PO TABS: 650.0000 mg | ORAL_TABLET | ORAL | Status: DC | PRN

## 2017-04-12 MED ORDER — VERAPAMIL HCL 2.5 MG/ML IV SOLN
INTRAVENOUS | Status: DC | PRN
  Administered 2017-04-12: 10 mL via INTRA_ARTERIAL

## 2017-04-12 MED ORDER — FENTANYL CITRATE (PF) 100 MCG/2ML IJ SOLN
INTRAMUSCULAR | Status: DC | PRN
Start: 2017-04-12 — End: 2017-04-12
  Administered 2017-04-12: 50 ug via INTRAVENOUS

## 2017-04-12 MED ORDER — LIDOCAINE HCL (PF) 1 % IJ SOLN
INTRAMUSCULAR | Status: AC
  Filled 2017-04-12: qty 30

## 2017-04-12 MED ORDER — SODIUM CHLORIDE 0.9% FLUSH: 3.0000 mL | Freq: Two times a day (BID) | INTRAVENOUS | Status: DC

## 2017-04-12 MED ORDER — ASPIRIN 81 MG PO CHEW
81.0000 mg | CHEWABLE_TABLET | ORAL | Status: AC
  Administered 2017-04-12: 81 mg via ORAL

## 2017-04-12 MED ORDER — HEPARIN SODIUM (PORCINE) 1000 UNIT/ML IJ SOLN
INTRAMUSCULAR | Status: AC
  Filled 2017-04-12: qty 1

## 2017-04-12 MED ORDER — IOPAMIDOL (ISOVUE-370) INJECTION 76%
INTRAVENOUS | Status: AC
  Filled 2017-04-12: qty 100

## 2017-04-12 MED ORDER — SODIUM CHLORIDE 0.9% FLUSH: 3.0000 mL | INTRAVENOUS | Status: DC | PRN

## 2017-04-12 MED ORDER — SODIUM CHLORIDE 0.9 % IV SOLN
INTRAVENOUS | Status: AC
  Administered 2017-04-12: 12:00:00 via INTRAVENOUS

## 2017-04-12 MED ORDER — MIDAZOLAM HCL 2 MG/2ML IJ SOLN
INTRAMUSCULAR | Status: AC
  Filled 2017-04-12: qty 2

## 2017-04-12 MED ORDER — VERAPAMIL HCL 2.5 MG/ML IV SOLN
INTRA_ARTERIAL | Status: DC | PRN
  Administered 2017-04-12: 10 mL via INTRA_ARTERIAL

## 2017-04-12 MED ORDER — HEPARIN (PORCINE) IN NACL 2-0.9 UNIT/ML-% IJ SOLN
INTRAMUSCULAR | Status: AC
  Filled 2017-04-12: qty 1000

## 2017-04-12 MED ORDER — LIDOCAINE HCL (PF) 1 % IJ SOLN
INTRAMUSCULAR | Status: DC | PRN
  Administered 2017-04-12: 2 mL
  Administered 2017-04-12: 3 mL

## 2017-04-12 MED ORDER — IOPAMIDOL (ISOVUE-370) INJECTION 76%
INTRAVENOUS | Status: AC
  Filled 2017-04-12: qty 50

## 2017-04-12 MED ORDER — MIDAZOLAM HCL 2 MG/2ML IJ SOLN
INTRAMUSCULAR | Status: DC | PRN
  Administered 2017-04-12: 1 mg via INTRAVENOUS

## 2017-04-12 MED ORDER — SODIUM CHLORIDE 0.9 % IV SOLN: 250.0000 mL | INTRAVENOUS | Status: DC | PRN

## 2017-04-12 MED ORDER — FENTANYL CITRATE (PF) 100 MCG/2ML IJ SOLN
INTRAMUSCULAR | Status: AC
  Filled 2017-04-12: qty 2

## 2017-04-12 MED ORDER — HEPARIN SODIUM (PORCINE) 1000 UNIT/ML IJ SOLN
INTRAMUSCULAR | Status: DC | PRN
  Administered 2017-04-12: 6000 [IU] via INTRAVENOUS

## 2017-04-12 MED ORDER — SODIUM CHLORIDE 0.9 % IV SOLN: INTRAVENOUS | Status: DC

## 2017-04-12 MED ORDER — HEPARIN (PORCINE) IN NACL 2-0.9 UNIT/ML-% IJ SOLN
INTRAMUSCULAR | Status: AC | PRN
  Administered 2017-04-12: 1000 mL

## 2017-04-12 MED ORDER — AMLODIPINE BESYLATE 10 MG PO TABS: 10.0000 mg | ORAL_TABLET | Freq: Every day | ORAL | Status: DC

## 2017-04-12 MED ORDER — ASPIRIN 81 MG PO CHEW
CHEWABLE_TABLET | ORAL | Status: AC
  Administered 2017-04-12: 81 mg via ORAL
  Filled 2017-04-12: qty 1

## 2017-04-12 MED ORDER — IOPAMIDOL (ISOVUE-370) INJECTION 76%
INTRAVENOUS | Status: DC | PRN
  Administered 2017-04-12: 125 mL via INTRA_ARTERIAL

## 2017-04-12 MED ORDER — ONDANSETRON HCL 4 MG/2ML IJ SOLN: 4.0000 mg | Freq: Four times a day (QID) | INTRAMUSCULAR | Status: DC | PRN

## 2017-04-12 MED ORDER — NITROGLYCERIN 1 MG/10 ML FOR IR/CATH LAB
INTRA_ARTERIAL | Status: AC
  Filled 2017-04-12: qty 10

## 2017-04-12 SURGICAL SUPPLY — 18 items
CATH BALLN WEDGE 5F 110CM (CATHETERS) ×2 IMPLANT
CATH INFINITI 5 FR 3DRC (CATHETERS) ×2 IMPLANT
CATH INFINITI 5 FR JL3.5 (CATHETERS) ×2 IMPLANT
CATH INFINITI JR4 5F (CATHETERS) ×2 IMPLANT
CATH LAUNCHER 5F AR1 (CATHETERS) ×2 IMPLANT
COVER PRB 48X5XTLSCP FOLD TPE (BAG) ×1 IMPLANT
COVER PROBE 5X48 (BAG) ×1
DEVICE RAD COMP TR BAND LRG (VASCULAR PRODUCTS) ×2 IMPLANT
GLIDESHEATH SLEND A-KIT 6F 22G (SHEATH) ×2 IMPLANT
GUIDEWIRE INQWIRE 1.5J.035X260 (WIRE) ×1 IMPLANT
INQWIRE 1.5J .035X260CM (WIRE) ×2
KIT HEART LEFT (KITS) ×2 IMPLANT
PACK CARDIAC CATHETERIZATION (CUSTOM PROCEDURE TRAY) ×2 IMPLANT
SHEATH GLIDE SLENDER 4/5FR (SHEATH) ×2 IMPLANT
TRANSDUCER W/STOPCOCK (MISCELLANEOUS) ×2 IMPLANT
TUBING CIL FLEX 10 FLL-RA (TUBING) ×2 IMPLANT
VALVE GUARDIAN II ~~LOC~~ HEMO (MISCELLANEOUS) ×2 IMPLANT
WIRE EMERALD 3MM-J .025X260CM (WIRE) ×2 IMPLANT

## 2017-04-12 NOTE — Discharge Instructions (Signed)
NO METFORMIN/GLUCOPHAGE FOR 2 DAYS ° ° °Radial Site Care °Refer to this sheet in the next few weeks. These instructions provide you with information about caring for yourself after your procedure. Your health care provider may also give you more specific instructions. Your treatment has been planned according to current medical practices, but problems sometimes occur. Call your health care provider if you have any problems or questions after your procedure. °What can I expect after the procedure? °After your procedure, it is typical to have the following: °· Bruising at the radial site that usually fades within 1-2 weeks. °· Blood collecting in the tissue (hematoma) that may be painful to the touch. It should usually decrease in size and tenderness within 1-2 weeks. ° °Follow these instructions at home: °· Take medicines only as directed by your health care provider. °· You may shower 24-48 hours after the procedure or as directed by your health care provider. Remove the bandage (dressing) and gently wash the site with plain soap and water. Pat the area dry with a clean towel. Do not rub the site, because this may cause bleeding. °· Do not take baths, swim, or use a hot tub until your health care provider approves. °· Check your insertion site every day for redness, swelling, or drainage. °· Do not apply powder or lotion to the site. °· Do not flex or bend the affected arm for 24 hours or as directed by your health care provider. °· Do not push or pull heavy objects with the affected arm for 24 hours or as directed by your health care provider. °· Do not lift over 10 lb (4.5 kg) for 5 days after your procedure or as directed by your health care provider. °· Ask your health care provider when it is okay to: °? Return to work or school. °? Resume usual physical activities or sports. °? Resume sexual activity. °· Do not drive home if you are discharged the same day as the procedure. Have someone else drive you. °· You  may drive 24 hours after the procedure unless otherwise instructed by your health care provider. °· Do not operate machinery or power tools for 24 hours after the procedure. °· If your procedure was done as an outpatient procedure, which means that you went home the same day as your procedure, a responsible adult should be with you for the first 24 hours after you arrive home. °· Keep all follow-up visits as directed by your health care provider. This is important. °Contact a health care provider if: °· You have a fever. °· You have chills. °· You have increased bleeding from the radial site. Hold pressure on the site. °Get help right away if: °· You have unusual pain at the radial site. °· You have redness, warmth, or swelling at the radial site. °· You have drainage (other than a small amount of blood on the dressing) from the radial site. °· The radial site is bleeding, and the bleeding does not stop after 30 minutes of holding steady pressure on the site. °· Your arm or hand becomes pale, cool, tingly, or numb. °This information is not intended to replace advice given to you by your health care provider. Make sure you discuss any questions you have with your health care provider. °Document Released: 05/29/2010 Document Revised: 10/02/2015 Document Reviewed: 11/12/2013 °Elsevier Interactive Patient Education © 2018 [employer] ° °

## 2017-04-12 NOTE — Interval H&P Note (Signed)
History and Physical Interval Note:  04/12/2017 1:47 PM  Melissa Reynolds  has presented today for surgery, with the diagnosis of abnormal stress  The various methods of treatment have been discussed with the patient and family. After consideration of risks, benefits and other options for treatment, the patient has consented to  Procedure(s): RIGHT/LEFT HEART CATH AND CORONARY ANGIOGRAPHY (N/A) as a surgical intervention .  The patient's history has been reviewed, patient examined, no change in status, stable for surgery.  I have reviewed the patient's chart and labs.  Questions were answered to the patient's satisfaction.     2016/2017 Appropriate Use Criteria for Coronary Revascularization Clinical Presentation: Diabetes Mellitus? Symptom Status? S/P CABG? Antianginal Therapy (# of long-acting drugs)? Results of Non-invasive testing? FFR/iFR results in all diseased vessels? Patient undergoing renal transplant? Patient undergoing percutaneous valve procedure (TAVR, MitraClip, Others)? Symptom Status:  Ischemic Symptoms  Non-invasive Testing:  Intermediate Risk  If no or indeterminate stress test, FFR/iFR results in all diseased vessels:  N/A  Diabetes Mellitus:  Yes  S/P CABG:  No  Antianginal therapy (number of long-acting drugs):  >=2  Patient undergoing renal transplant:  No  Patient undergoing percutaneous valve procedure:  No    newline 1 Vessel Disease PCI CABG  No proximal LAD involvement, No proximal left dominant LCX involvement A (8); Indication 2 M (6); Indication 2   Proximal left dominant LCX involvement A (8); Indication 5 A (8); Indication 5   Proximal LAD involvement A (8); Indication 5 A (8); Indication 5   newline 2 Vessel Disease  No proximal LAD involvement A (8); Indication 8 A (7); Indication 8   Proximal LAD involvement A (8); Indication 14 A (9); Indication 14   newline 3 Vessel Disease  Low disease complexity (e.g., focal stenoses, SYNTAX <=22) A (7);  Indication 19 A (9); Indication 19   Intermediate or high disease complexity (e.g., SYNTAX >=23) M (6); Indication 23 A (9); Indication 23   newline Left Main Disease  Isolated LMCA disease: ostial or midshaft A (7); Indication 24 A (9); Indication 24   Isolated LMCA disease: bifurcation involvement M (6); Indication 25 A (9); Indication 25   LMCA ostial or midshaft, concurrent low disease burden multivessel disease (e.g., 1-2 additional focal stenoses, SYNTAX <=22) A (7); Indication 26 A (9); Indication 26   LMCA ostial or midshaft, concurrent intermediate or high disease burden multivessel disease (e.g., 1-2 additional bifurcation stenoses, long stenoses, SYNTAX >=23) M (4); Indication 27 A (9); Indication 27   LMCA bifurcation involvement, concurrent low disease burden multivessel disease (e.g., 1-2 additional focal stenoses, SYNTAX <=22) M (6); Indication 28 A (9); Indication 28   LMCA bifurcation involvement, concurrent intermediate or high disease burden multivessel disease (e.g., 1-2 additional bifurcation stenoses, long stenoses, SYNTAX >=23) R (3); Indication 29 A (9); Indication [place]

## 2017-04-13 ENCOUNTER — Encounter (HOSPITAL_COMMUNITY): Payer: Self-pay | Admitting: Cardiology

## 2017-04-21 DIAGNOSIS — M0589 Other rheumatoid arthritis with rheumatoid factor of multiple sites: Secondary | ICD-10-CM | POA: Diagnosis not present

## 2017-04-21 DIAGNOSIS — Z79899 Other long term (current) drug therapy: Secondary | ICD-10-CM | POA: Diagnosis not present

## 2017-04-21 DIAGNOSIS — I25118 Atherosclerotic heart disease of native coronary artery with other forms of angina pectoris: Secondary | ICD-10-CM | POA: Diagnosis not present

## 2017-04-21 DIAGNOSIS — I10 Essential (primary) hypertension: Secondary | ICD-10-CM | POA: Diagnosis not present

## 2017-05-17 DIAGNOSIS — G40909 Epilepsy, unspecified, not intractable, without status epilepticus: Secondary | ICD-10-CM | POA: Diagnosis not present

## 2017-05-17 DIAGNOSIS — M069 Rheumatoid arthritis, unspecified: Secondary | ICD-10-CM | POA: Diagnosis not present

## 2017-05-17 DIAGNOSIS — E118 Type 2 diabetes mellitus with unspecified complications: Secondary | ICD-10-CM | POA: Diagnosis not present

## 2017-05-17 DIAGNOSIS — I2510 Atherosclerotic heart disease of native coronary artery without angina pectoris: Secondary | ICD-10-CM | POA: Diagnosis not present

## 2017-05-17 DIAGNOSIS — I10 Essential (primary) hypertension: Secondary | ICD-10-CM | POA: Diagnosis not present

## 2017-05-30 DIAGNOSIS — M0589 Other rheumatoid arthritis with rheumatoid factor of multiple sites: Secondary | ICD-10-CM | POA: Diagnosis not present

## 2017-06-07 ENCOUNTER — Ambulatory Visit (HOSPITAL_COMMUNITY)
Admission: RE | Admit: 2017-06-07 | Discharge: 2017-06-07 | Disposition: A | Discharge status: Home / Self Care | Payer: Medicare Other | Source: Ambulatory Visit | Attending: Family Medicine | Admitting: Family Medicine

## 2017-06-07 ENCOUNTER — Other Ambulatory Visit (HOSPITAL_COMMUNITY): Payer: Self-pay | Admitting: Family Medicine

## 2017-06-07 DIAGNOSIS — M545 Low back pain: Secondary | ICD-10-CM | POA: Diagnosis present

## 2017-06-07 DIAGNOSIS — M4316 Spondylolisthesis, lumbar region: Secondary | ICD-10-CM | POA: Diagnosis not present

## 2017-06-07 DIAGNOSIS — R52 Pain, unspecified: Secondary | ICD-10-CM

## 2017-06-07 DIAGNOSIS — M25551 Pain in right hip: Secondary | ICD-10-CM | POA: Diagnosis present

## 2017-06-14 DIAGNOSIS — M0589 Other rheumatoid arthritis with rheumatoid factor of multiple sites: Secondary | ICD-10-CM | POA: Diagnosis not present

## 2017-06-14 DIAGNOSIS — Z79899 Other long term (current) drug therapy: Secondary | ICD-10-CM | POA: Diagnosis not present

## 2017-06-30 DIAGNOSIS — E669 Obesity, unspecified: Secondary | ICD-10-CM | POA: Diagnosis not present

## 2017-06-30 DIAGNOSIS — M0589 Other rheumatoid arthritis with rheumatoid factor of multiple sites: Secondary | ICD-10-CM | POA: Diagnosis not present

## 2017-06-30 DIAGNOSIS — M5136 Other intervertebral disc degeneration, lumbar region: Secondary | ICD-10-CM | POA: Diagnosis not present

## 2017-06-30 DIAGNOSIS — M06341 Rheumatoid nodule, right hand: Secondary | ICD-10-CM | POA: Diagnosis not present

## 2017-06-30 DIAGNOSIS — Z6834 Body mass index (BMI) 34.0-34.9, adult: Secondary | ICD-10-CM | POA: Diagnosis not present

## 2017-06-30 DIAGNOSIS — M150 Primary generalized (osteo)arthritis: Secondary | ICD-10-CM | POA: Diagnosis not present

## 2017-06-30 DIAGNOSIS — M25551 Pain in right hip: Secondary | ICD-10-CM | POA: Diagnosis not present

## 2017-07-14 DIAGNOSIS — Z79899 Other long term (current) drug therapy: Secondary | ICD-10-CM | POA: Diagnosis not present

## 2017-07-14 DIAGNOSIS — M0589 Other rheumatoid arthritis with rheumatoid factor of multiple sites: Secondary | ICD-10-CM | POA: Diagnosis not present

## 2017-08-08 DIAGNOSIS — I10 Essential (primary) hypertension: Secondary | ICD-10-CM | POA: Diagnosis not present

## 2017-08-08 DIAGNOSIS — E1165 Type 2 diabetes mellitus with hyperglycemia: Secondary | ICD-10-CM | POA: Diagnosis not present

## 2017-08-08 DIAGNOSIS — G40909 Epilepsy, unspecified, not intractable, without status epilepticus: Secondary | ICD-10-CM | POA: Diagnosis not present

## 2017-08-15 DIAGNOSIS — M0589 Other rheumatoid arthritis with rheumatoid factor of multiple sites: Secondary | ICD-10-CM | POA: Diagnosis not present

## 2017-09-12 DIAGNOSIS — Z79899 Other long term (current) drug therapy: Secondary | ICD-10-CM | POA: Diagnosis not present

## 2017-09-12 DIAGNOSIS — M0589 Other rheumatoid arthritis with rheumatoid factor of multiple sites: Secondary | ICD-10-CM | POA: Diagnosis not present

## 2017-09-19 ENCOUNTER — Other Ambulatory Visit (HOSPITAL_COMMUNITY): Payer: Self-pay | Admitting: *Deleted

## 2017-09-19 DIAGNOSIS — C189 Malignant neoplasm of colon, unspecified: Secondary | ICD-10-CM

## 2017-09-20 ENCOUNTER — Ambulatory Visit (HOSPITAL_COMMUNITY): Payer: Medicare Other | Admitting: Internal Medicine

## 2017-09-20 ENCOUNTER — Other Ambulatory Visit (HOSPITAL_COMMUNITY): Payer: Medicare Other

## 2017-09-27 DIAGNOSIS — M0589 Other rheumatoid arthritis with rheumatoid factor of multiple sites: Secondary | ICD-10-CM | POA: Diagnosis not present

## 2017-09-27 DIAGNOSIS — M25551 Pain in right hip: Secondary | ICD-10-CM | POA: Diagnosis not present

## 2017-09-27 DIAGNOSIS — M5136 Other intervertebral disc degeneration, lumbar region: Secondary | ICD-10-CM | POA: Diagnosis not present

## 2017-09-27 DIAGNOSIS — M150 Primary generalized (osteo)arthritis: Secondary | ICD-10-CM | POA: Diagnosis not present

## 2017-09-27 DIAGNOSIS — E669 Obesity, unspecified: Secondary | ICD-10-CM | POA: Diagnosis not present

## 2017-09-27 DIAGNOSIS — Z6835 Body mass index (BMI) 35.0-35.9, adult: Secondary | ICD-10-CM | POA: Diagnosis not present

## 2017-09-27 DIAGNOSIS — M06341 Rheumatoid nodule, right hand: Secondary | ICD-10-CM | POA: Diagnosis not present

## 2017-10-10 DIAGNOSIS — M0589 Other rheumatoid arthritis with rheumatoid factor of multiple sites: Secondary | ICD-10-CM | POA: Diagnosis not present

## 2017-10-12 DIAGNOSIS — M130 Polyarthritis, unspecified: Secondary | ICD-10-CM | POA: Diagnosis not present

## 2017-10-12 DIAGNOSIS — E876 Hypokalemia: Secondary | ICD-10-CM | POA: Diagnosis not present

## 2017-10-12 DIAGNOSIS — I10 Essential (primary) hypertension: Secondary | ICD-10-CM | POA: Diagnosis not present

## 2017-10-12 DIAGNOSIS — G40119 Localization-related (focal) (partial) symptomatic epilepsy and epileptic syndromes with simple partial seizures, intractable, without status epilepticus: Secondary | ICD-10-CM | POA: Diagnosis not present

## 2017-10-17 DIAGNOSIS — R569 Unspecified convulsions: Secondary | ICD-10-CM | POA: Diagnosis not present

## 2017-10-17 DIAGNOSIS — R799 Abnormal finding of blood chemistry, unspecified: Secondary | ICD-10-CM | POA: Diagnosis not present

## 2017-10-17 DIAGNOSIS — R7309 Other abnormal glucose: Secondary | ICD-10-CM | POA: Diagnosis not present

## 2017-10-17 DIAGNOSIS — M069 Rheumatoid arthritis, unspecified: Secondary | ICD-10-CM | POA: Diagnosis not present

## 2017-10-17 DIAGNOSIS — I10 Essential (primary) hypertension: Secondary | ICD-10-CM | POA: Diagnosis not present

## 2017-10-17 DIAGNOSIS — E118 Type 2 diabetes mellitus with unspecified complications: Secondary | ICD-10-CM | POA: Diagnosis not present

## 2017-10-20 DIAGNOSIS — M47816 Spondylosis without myelopathy or radiculopathy, lumbar region: Secondary | ICD-10-CM | POA: Diagnosis not present

## 2017-10-20 DIAGNOSIS — M9903 Segmental and somatic dysfunction of lumbar region: Secondary | ICD-10-CM | POA: Diagnosis not present

## 2017-10-20 DIAGNOSIS — M545 Low back pain: Secondary | ICD-10-CM | POA: Diagnosis not present

## 2017-10-25 DIAGNOSIS — M545 Low back pain: Secondary | ICD-10-CM | POA: Diagnosis not present

## 2017-10-25 DIAGNOSIS — M47816 Spondylosis without myelopathy or radiculopathy, lumbar region: Secondary | ICD-10-CM | POA: Diagnosis not present

## 2017-10-25 DIAGNOSIS — M9903 Segmental and somatic dysfunction of lumbar region: Secondary | ICD-10-CM | POA: Diagnosis not present

## 2017-10-27 DIAGNOSIS — M9903 Segmental and somatic dysfunction of lumbar region: Secondary | ICD-10-CM | POA: Diagnosis not present

## 2017-10-27 DIAGNOSIS — M545 Low back pain: Secondary | ICD-10-CM | POA: Diagnosis not present

## 2017-10-27 DIAGNOSIS — M47816 Spondylosis without myelopathy or radiculopathy, lumbar region: Secondary | ICD-10-CM | POA: Diagnosis not present

## 2017-11-01 DIAGNOSIS — M545 Low back pain: Secondary | ICD-10-CM | POA: Diagnosis not present

## 2017-11-01 DIAGNOSIS — M47816 Spondylosis without myelopathy or radiculopathy, lumbar region: Secondary | ICD-10-CM | POA: Diagnosis not present

## 2017-11-01 DIAGNOSIS — M9903 Segmental and somatic dysfunction of lumbar region: Secondary | ICD-10-CM | POA: Diagnosis not present

## 2017-11-07 DIAGNOSIS — M0589 Other rheumatoid arthritis with rheumatoid factor of multiple sites: Secondary | ICD-10-CM | POA: Diagnosis not present

## 2017-11-07 DIAGNOSIS — Z79899 Other long term (current) drug therapy: Secondary | ICD-10-CM | POA: Diagnosis not present

## 2017-11-08 DIAGNOSIS — M9903 Segmental and somatic dysfunction of lumbar region: Secondary | ICD-10-CM | POA: Diagnosis not present

## 2017-11-08 DIAGNOSIS — M545 Low back pain: Secondary | ICD-10-CM | POA: Diagnosis not present

## 2017-11-08 DIAGNOSIS — M47816 Spondylosis without myelopathy or radiculopathy, lumbar region: Secondary | ICD-10-CM | POA: Diagnosis not present

## 2017-11-09 ENCOUNTER — Other Ambulatory Visit (HOSPITAL_COMMUNITY): Payer: Medicare Other

## 2017-11-09 ENCOUNTER — Inpatient Hospital Stay (HOSPITAL_COMMUNITY): Payer: Medicare Other | Attending: Internal Medicine

## 2017-11-09 DIAGNOSIS — Z7982 Long term (current) use of aspirin: Secondary | ICD-10-CM | POA: Diagnosis not present

## 2017-11-09 DIAGNOSIS — Z79899 Other long term (current) drug therapy: Secondary | ICD-10-CM | POA: Diagnosis not present

## 2017-11-09 DIAGNOSIS — E669 Obesity, unspecified: Secondary | ICD-10-CM | POA: Diagnosis not present

## 2017-11-09 DIAGNOSIS — R569 Unspecified convulsions: Secondary | ICD-10-CM | POA: Diagnosis not present

## 2017-11-09 DIAGNOSIS — I10 Essential (primary) hypertension: Secondary | ICD-10-CM | POA: Insufficient documentation

## 2017-11-09 DIAGNOSIS — Z85038 Personal history of other malignant neoplasm of large intestine: Secondary | ICD-10-CM | POA: Insufficient documentation

## 2017-11-09 DIAGNOSIS — Z7984 Long term (current) use of oral hypoglycemic drugs: Secondary | ICD-10-CM | POA: Diagnosis not present

## 2017-11-09 DIAGNOSIS — Z9221 Personal history of antineoplastic chemotherapy: Secondary | ICD-10-CM | POA: Insufficient documentation

## 2017-11-09 DIAGNOSIS — G4700 Insomnia, unspecified: Secondary | ICD-10-CM | POA: Diagnosis not present

## 2017-11-09 DIAGNOSIS — E119 Type 2 diabetes mellitus without complications: Secondary | ICD-10-CM | POA: Insufficient documentation

## 2017-11-09 DIAGNOSIS — C189 Malignant neoplasm of colon, unspecified: Secondary | ICD-10-CM

## 2017-11-09 LAB — CBC WITH DIFFERENTIAL/PLATELET
BASOS PCT: 0 %
Basophils Absolute: 0 10*3/uL (ref 0.0–0.1)
Eosinophils Absolute: 0 10*3/uL (ref 0.0–0.7)
Eosinophils Relative: 0 %
HEMATOCRIT: 35.4 % — AB (ref 36.0–46.0)
HEMOGLOBIN: 11.9 g/dL — AB (ref 12.0–15.0)
LYMPHS ABS: 1.7 10*3/uL (ref 0.7–4.0)
LYMPHS PCT: 14 %
MCH: 26.1 pg (ref 26.0–34.0)
MCHC: 33.6 g/dL (ref 30.0–36.0)
MCV: 77.6 fL — AB (ref 78.0–100.0)
MONOS PCT: 3 %
Monocytes Absolute: 0.4 10*3/uL (ref 0.1–1.0)
NEUTROS ABS: 10.2 10*3/uL — AB (ref 1.7–7.7)
NEUTROS PCT: 83 %
Platelets: 241 10*3/uL (ref 150–400)
RBC: 4.56 MIL/uL (ref 3.87–5.11)
RDW: 13.9 % (ref 11.5–15.5)
WBC: 12.3 10*3/uL — ABNORMAL HIGH (ref 4.0–10.5)

## 2017-11-09 LAB — COMPREHENSIVE METABOLIC PANEL
ALBUMIN: 3.3 g/dL — AB (ref 3.5–5.0)
ALK PHOS: 70 U/L (ref 38–126)
ALT: 13 U/L (ref 0–44)
ANION GAP: 9 (ref 5–15)
AST: 18 U/L (ref 15–41)
BILIRUBIN TOTAL: 1 mg/dL (ref 0.3–1.2)
BUN: 12 mg/dL (ref 8–23)
CO2: 28 mmol/L (ref 22–32)
Calcium: 8.4 mg/dL — ABNORMAL LOW (ref 8.9–10.3)
Chloride: 103 mmol/L (ref 98–111)
Creatinine, Ser: 0.7 mg/dL (ref 0.44–1.00)
GFR calc Af Amer: >60 mL/min (ref >60)
GLUCOSE: 158 mg/dL — AB (ref 70–99)
Potassium: 4.1 mmol/L (ref 3.5–5.1)
Sodium: 140 mmol/L (ref 135–145)
TOTAL PROTEIN: 6.8 g/dL (ref 6.5–8.1)

## 2017-11-16 ENCOUNTER — Inpatient Hospital Stay (HOSPITAL_BASED_OUTPATIENT_CLINIC_OR_DEPARTMENT_OTHER): Payer: Medicare Other | Admitting: Internal Medicine

## 2017-11-16 ENCOUNTER — Encounter (HOSPITAL_COMMUNITY): Payer: Self-pay | Admitting: Internal Medicine

## 2017-11-16 VITALS — BP 130/61 | HR 72 | Temp 97.8°F | Resp 16 | Wt 209.6 lb

## 2017-11-16 DIAGNOSIS — R718 Other abnormality of red blood cells: Secondary | ICD-10-CM | POA: Diagnosis not present

## 2017-11-16 DIAGNOSIS — C189 Malignant neoplasm of colon, unspecified: Secondary | ICD-10-CM | POA: Diagnosis not present

## 2017-11-16 DIAGNOSIS — R569 Unspecified convulsions: Secondary | ICD-10-CM | POA: Diagnosis not present

## 2017-11-16 DIAGNOSIS — G4700 Insomnia, unspecified: Secondary | ICD-10-CM | POA: Diagnosis not present

## 2017-11-16 DIAGNOSIS — Z9221 Personal history of antineoplastic chemotherapy: Secondary | ICD-10-CM | POA: Diagnosis not present

## 2017-11-16 DIAGNOSIS — Z79899 Other long term (current) drug therapy: Secondary | ICD-10-CM | POA: Diagnosis not present

## 2017-11-16 DIAGNOSIS — Z85038 Personal history of other malignant neoplasm of large intestine: Secondary | ICD-10-CM | POA: Diagnosis not present

## 2017-11-16 DIAGNOSIS — I10 Essential (primary) hypertension: Secondary | ICD-10-CM | POA: Diagnosis not present
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
## 2017-11-16 NOTE — Patient Instructions (Signed)
[facility] at [facility] Discharge Instructions  You saw Dr. Higgs today.   Thank you for choosing [facility] at [facility] to provide your oncology and hematology care.  To afford each patient quality time with our provider, please arrive at least 15 minutes before your scheduled appointment time.   If you have a lab appointment with the Cancer Center please come in thru the  Main Entrance and check in at the main information desk  You need to re-schedule your appointment should you arrive 10 or more minutes late.  We strive to give you quality time with our providers, and arriving late affects you and other patients whose appointments are after yours.  Also, if you no show three or more times for appointments you may be dismissed from the clinic at the providers discretion.     Again, thank you for choosing [facility].  Our hope is that these requests will decrease the amount of time that you wait before being seen by our physicians.       _____________________________________________________________  Should you have questions after your visit to [facility], please contact our office at [phone] between the hours of 8:30 a.m. and 4:30 p.m.  Voicemails left after 4:30 p.m. will not be returned until the following business day.  For prescription refill requests, have your pharmacy contact our office.       Resources For Cancer Patients and their Caregivers ? American Cancer Society: Can assist with transportation, wigs, general needs, runs Look Good Feel Better.        1-888-227-6333 ? Cancer Care: Provides financial assistance, online support groups, medication/co-pay assistance.  1-800-813-HOPE (4673) ? [facility] Assists [place] cancer patients and their families through emotional , educational and financial support.  336-427-4357 ? [place] DSS Where to apply for food  stamps, Medicaid and utility assistance. 336-342-1394 ? [facility]: Transportation to medical appointments. 336-347-2287 ? Social Security Administration: May apply for disability if have a Stage IV cancer. 336-342-7796 1-800-772-1213 ? [place] Aging, Disability and Transit Services: Assists with nutrition, care and transit needs. 336-349-2343  Cancer Center Support Programs:   > Cancer Support Group  2nd Tuesday of the month 1pm-2pm, Journey Room   > Creative Journey  3rd Tuesday of the month 1130am-1pm, Journey Room     

## 2017-11-21 DIAGNOSIS — M47816 Spondylosis without myelopathy or radiculopathy, lumbar region: Secondary | ICD-10-CM | POA: Diagnosis not present

## 2017-11-21 DIAGNOSIS — M545 Low back pain: Secondary | ICD-10-CM | POA: Diagnosis not present

## 2017-11-21 DIAGNOSIS — M9903 Segmental and somatic dysfunction of lumbar region: Secondary | ICD-10-CM | POA: Diagnosis not present

## 2017-12-05 DIAGNOSIS — M0589 Other rheumatoid arthritis with rheumatoid factor of multiple sites: Secondary | ICD-10-CM | POA: Diagnosis not present

## 2017-12-27 DIAGNOSIS — M06341 Rheumatoid nodule, right hand: Secondary | ICD-10-CM | POA: Diagnosis not present

## 2017-12-27 DIAGNOSIS — E669 Obesity, unspecified: Secondary | ICD-10-CM | POA: Diagnosis not present

## 2017-12-27 DIAGNOSIS — M0589 Other rheumatoid arthritis with rheumatoid factor of multiple sites: Secondary | ICD-10-CM | POA: Diagnosis not present

## 2017-12-27 DIAGNOSIS — Z6834 Body mass index (BMI) 34.0-34.9, adult: Secondary | ICD-10-CM | POA: Diagnosis not present

## 2017-12-27 DIAGNOSIS — M25551 Pain in right hip: Secondary | ICD-10-CM | POA: Diagnosis not present

## 2017-12-27 DIAGNOSIS — M5136 Other intervertebral disc degeneration, lumbar region: Secondary | ICD-10-CM | POA: Diagnosis not present

## 2017-12-27 DIAGNOSIS — M150 Primary generalized (osteo)arthritis: Secondary | ICD-10-CM | POA: Diagnosis not present

## 2018-01-02 DIAGNOSIS — M0589 Other rheumatoid arthritis with rheumatoid factor of multiple sites: Secondary | ICD-10-CM | POA: Diagnosis not present

## 2018-01-23 DIAGNOSIS — E118 Type 2 diabetes mellitus with unspecified complications: Secondary | ICD-10-CM | POA: Diagnosis not present

## 2018-01-23 DIAGNOSIS — Z0000 Encounter for general adult medical examination without abnormal findings: Secondary | ICD-10-CM | POA: Diagnosis not present

## 2018-01-23 DIAGNOSIS — R569 Unspecified convulsions: Secondary | ICD-10-CM | POA: Diagnosis not present

## 2018-01-23 DIAGNOSIS — N390 Urinary tract infection, site not specified: Secondary | ICD-10-CM | POA: Diagnosis not present

## 2018-01-23 DIAGNOSIS — I10 Essential (primary) hypertension: Secondary | ICD-10-CM | POA: Diagnosis not present

## 2018-01-30 DIAGNOSIS — M0589 Other rheumatoid arthritis with rheumatoid factor of multiple sites: Secondary | ICD-10-CM | POA: Diagnosis not present

## 2018-03-01 DIAGNOSIS — M0589 Other rheumatoid arthritis with rheumatoid factor of multiple sites: Secondary | ICD-10-CM | POA: Diagnosis not present

## 2018-03-16 ENCOUNTER — Other Ambulatory Visit: Payer: Self-pay

## 2018-03-16 ENCOUNTER — Encounter (HOSPITAL_COMMUNITY): Payer: Self-pay

## 2018-03-16 ENCOUNTER — Emergency Department (HOSPITAL_COMMUNITY)
Admission: EM | Admit: 2018-03-16 | Discharge: 2018-03-17 | Disposition: A | Discharge status: Home / Self Care | Payer: Medicare Other | Attending: Emergency Medicine | Admitting: Emergency Medicine

## 2018-03-16 DIAGNOSIS — Z85038 Personal history of other malignant neoplasm of large intestine: Secondary | ICD-10-CM | POA: Insufficient documentation

## 2018-03-16 DIAGNOSIS — E119 Type 2 diabetes mellitus without complications: Secondary | ICD-10-CM | POA: Insufficient documentation

## 2018-03-16 DIAGNOSIS — Z7984 Long term (current) use of oral hypoglycemic drugs: Secondary | ICD-10-CM | POA: Insufficient documentation

## 2018-03-16 DIAGNOSIS — Z79899 Other long term (current) drug therapy: Secondary | ICD-10-CM | POA: Insufficient documentation

## 2018-03-16 DIAGNOSIS — Z7982 Long term (current) use of aspirin: Secondary | ICD-10-CM | POA: Diagnosis not present

## 2018-03-16 DIAGNOSIS — Z9049 Acquired absence of other specified parts of digestive tract: Secondary | ICD-10-CM | POA: Diagnosis not present

## 2018-03-16 DIAGNOSIS — I10 Essential (primary) hypertension: Secondary | ICD-10-CM | POA: Diagnosis not present

## 2018-03-16 DIAGNOSIS — R1032 Left lower quadrant pain: Secondary | ICD-10-CM | POA: Diagnosis not present

## 2018-03-16 DIAGNOSIS — D509 Iron deficiency anemia, unspecified: Secondary | ICD-10-CM

## 2018-03-16 DIAGNOSIS — E876 Hypokalemia: Secondary | ICD-10-CM | POA: Insufficient documentation

## 2018-03-16 DIAGNOSIS — R109 Unspecified abdominal pain: Secondary | ICD-10-CM

## 2018-03-16 NOTE — ED Triage Notes (Signed)
Pt c/o pain in her left lower back, frequency with urination.

## 2018-03-17 ENCOUNTER — Emergency Department (HOSPITAL_COMMUNITY): Payer: Medicare Other

## 2018-03-17 DIAGNOSIS — R1032 Left lower quadrant pain: Secondary | ICD-10-CM | POA: Diagnosis not present

## 2018-03-17 DIAGNOSIS — R109 Unspecified abdominal pain: Secondary | ICD-10-CM | POA: Diagnosis not present

## 2018-03-17 LAB — CBC WITH DIFFERENTIAL/PLATELET
ABS IMMATURE GRANULOCYTES: 0.02 10*3/uL (ref 0.00–0.07)
BASOS PCT: 1 %
Basophils Absolute: 0.1 10*3/uL (ref 0.0–0.1)
Eosinophils Absolute: 0.2 10*3/uL (ref 0.0–0.5)
Eosinophils Relative: 2 %
HCT: 33.2 % — ABNORMAL LOW (ref 36.0–46.0)
HEMOGLOBIN: 11.2 g/dL — AB (ref 12.0–15.0)
Immature Granulocytes: 0 %
LYMPHS PCT: 29 %
Lymphs Abs: 2.8 10*3/uL (ref 0.7–4.0)
MCH: 26.5 pg (ref 26.0–34.0)
MCHC: 33.7 g/dL (ref 30.0–36.0)
MCV: 78.7 fL — ABNORMAL LOW (ref 80.0–100.0)
MONO ABS: 0.6 10*3/uL (ref 0.1–1.0)
Monocytes Relative: 7 %
NEUTROS ABS: 6.1 10*3/uL (ref 1.7–7.7)
NEUTROS PCT: 61 %
PLATELETS: 266 10*3/uL (ref 150–400)
RBC: 4.22 MIL/uL (ref 3.87–5.11)
RDW: 13.1 % (ref 11.5–15.5)
WBC: 9.8 10*3/uL (ref 4.0–10.5)
nRBC: 0 % (ref 0.0–0.2)

## 2018-03-17 LAB — COMPREHENSIVE METABOLIC PANEL
ALT: 12 U/L (ref 0–44)
ANION GAP: 8 (ref 5–15)
AST: 12 U/L — ABNORMAL LOW (ref 15–41)
Albumin: 3.1 g/dL — ABNORMAL LOW (ref 3.5–5.0)
Alkaline Phosphatase: 62 U/L (ref 38–126)
BILIRUBIN TOTAL: 0.7 mg/dL (ref 0.3–1.2)
BUN: 15 mg/dL (ref 8–23)
CO2: 27 mmol/L (ref 22–32)
Calcium: 8.2 mg/dL — ABNORMAL LOW (ref 8.9–10.3)
Chloride: 103 mmol/L (ref 98–111)
Creatinine, Ser: 0.66 mg/dL (ref 0.44–1.00)
Glucose, Bld: 124 mg/dL — ABNORMAL HIGH (ref 70–99)
POTASSIUM: 3 mmol/L — AB (ref 3.5–5.1)
Sodium: 138 mmol/L (ref 135–145)
TOTAL PROTEIN: 6.1 g/dL — AB (ref 6.5–8.1)

## 2018-03-17 LAB — URINALYSIS, ROUTINE W REFLEX MICROSCOPIC
BILIRUBIN URINE: NEGATIVE
Glucose, UA: NEGATIVE mg/dL
HGB URINE DIPSTICK: NEGATIVE
KETONES UR: NEGATIVE mg/dL
Nitrite: NEGATIVE
PH: 6 (ref 5.0–8.0)
PROTEIN: NEGATIVE mg/dL
Specific Gravity, Urine: 1.019 (ref 1.005–1.030)

## 2018-03-17 MED ORDER — OXYCODONE-ACETAMINOPHEN 5-325 MG PO TABS
1.0000 | ORAL_TABLET | Freq: Once | ORAL | Status: AC
  Administered 2018-03-17: 1 via ORAL
  Filled 2018-03-17: qty 1

## 2018-03-17 MED ORDER — OXYCODONE-ACETAMINOPHEN 7.5-325 MG PO TABS: 1.0000 | ORAL_TABLET | ORAL | 0 refills | Status: DC | PRN

## 2018-03-17 MED ORDER — MORPHINE SULFATE (PF) 4 MG/ML IV SOLN
4.0000 mg | Freq: Once | INTRAVENOUS | Status: AC
  Administered 2018-03-17: 4 mg via INTRAVENOUS
  Filled 2018-03-17: qty 1

## 2018-03-17 MED ORDER — METHOCARBAMOL 1000 MG/10ML IJ SOLN
INTRAMUSCULAR | Status: AC
  Filled 2018-03-17: qty 10

## 2018-03-17 MED ORDER — CYCLOBENZAPRINE HCL 10 MG PO TABS: 10.0000 mg | ORAL_TABLET | Freq: Three times a day (TID) | ORAL | 0 refills | Status: DC | PRN

## 2018-03-17 MED ORDER — SODIUM CHLORIDE 0.9 % IV SOLN
INTRAVENOUS | Status: DC | PRN
  Administered 2018-03-17: 500 mL via INTRAVENOUS

## 2018-03-17 MED ORDER — POTASSIUM CHLORIDE ER 20 MEQ PO TBCR: 20.0000 meq | EXTENDED_RELEASE_TABLET | Freq: Two times a day (BID) | ORAL | 0 refills | Status: DC

## 2018-03-17 MED ORDER — METHOCARBAMOL 1000 MG/10ML IJ SOLN
1000.0000 mg | Freq: Once | INTRAMUSCULAR | Status: AC
  Administered 2018-03-17: 1000 mg via INTRAVENOUS
  Filled 2018-03-17: qty 10

## 2018-03-17 NOTE — Discharge Instructions (Signed)
Apply ice for thirty minutes 3-4 times a day.
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
## 2018-03-18 LAB — URINE CULTURE
CULTURE: NO GROWTH
SPECIAL REQUESTS: NORMAL

## 2018-03-23 DIAGNOSIS — I25118 Atherosclerotic heart disease of native coronary artery with other forms of angina pectoris: Secondary | ICD-10-CM | POA: Diagnosis not present

## 2018-03-23 DIAGNOSIS — R55 Syncope and collapse: Secondary | ICD-10-CM | POA: Diagnosis not present

## 2018-03-23 DIAGNOSIS — R12 Heartburn: Secondary | ICD-10-CM | POA: Diagnosis not present

## 2018-03-23 DIAGNOSIS — I10 Essential (primary) hypertension: Secondary | ICD-10-CM | POA: Diagnosis not present

## 2018-03-30 DIAGNOSIS — M0589 Other rheumatoid arthritis with rheumatoid factor of multiple sites: Secondary | ICD-10-CM | POA: Diagnosis not present

## 2018-04-21 DIAGNOSIS — R55 Syncope and collapse: Secondary | ICD-10-CM | POA: Diagnosis not present

## 2018-04-25 DIAGNOSIS — R569 Unspecified convulsions: Secondary | ICD-10-CM | POA: Diagnosis not present

## 2018-04-25 DIAGNOSIS — R7309 Other abnormal glucose: Secondary | ICD-10-CM | POA: Diagnosis not present

## 2018-04-25 DIAGNOSIS — N390 Urinary tract infection, site not specified: Secondary | ICD-10-CM | POA: Diagnosis not present

## 2018-04-25 DIAGNOSIS — R799 Abnormal finding of blood chemistry, unspecified: Secondary | ICD-10-CM | POA: Diagnosis not present

## 2018-04-25 DIAGNOSIS — I10 Essential (primary) hypertension: Secondary | ICD-10-CM | POA: Diagnosis not present

## 2018-04-25 DIAGNOSIS — M549 Dorsalgia, unspecified: Secondary | ICD-10-CM | POA: Diagnosis not present

## 2018-04-25 DIAGNOSIS — Z6832 Body mass index (BMI) 32.0-32.9, adult: Secondary | ICD-10-CM | POA: Diagnosis not present

## 2018-04-25 DIAGNOSIS — E1165 Type 2 diabetes mellitus with hyperglycemia: Secondary | ICD-10-CM | POA: Diagnosis not present

## 2018-04-25 DIAGNOSIS — M069 Rheumatoid arthritis, unspecified: Secondary | ICD-10-CM | POA: Diagnosis not present

## 2018-04-25 DIAGNOSIS — E118 Type 2 diabetes mellitus with unspecified complications: Secondary | ICD-10-CM | POA: Diagnosis not present

## 2018-04-25 DIAGNOSIS — K625 Hemorrhage of anus and rectum: Secondary | ICD-10-CM | POA: Diagnosis not present

## 2018-04-27 DIAGNOSIS — Z79899 Other long term (current) drug therapy: Secondary | ICD-10-CM | POA: Diagnosis not present

## 2018-04-27 DIAGNOSIS — M0589 Other rheumatoid arthritis with rheumatoid factor of multiple sites: Secondary | ICD-10-CM | POA: Diagnosis not present

## 2018-05-16 DIAGNOSIS — M06341 Rheumatoid nodule, right hand: Secondary | ICD-10-CM | POA: Diagnosis not present

## 2018-05-16 DIAGNOSIS — M150 Primary generalized (osteo)arthritis: Secondary | ICD-10-CM | POA: Diagnosis not present

## 2018-05-16 DIAGNOSIS — M0589 Other rheumatoid arthritis with rheumatoid factor of multiple sites: Secondary | ICD-10-CM | POA: Diagnosis not present

## 2018-05-16 DIAGNOSIS — Z6834 Body mass index (BMI) 34.0-34.9, adult: Secondary | ICD-10-CM | POA: Diagnosis not present

## 2018-05-16 DIAGNOSIS — E669 Obesity, unspecified: Secondary | ICD-10-CM | POA: Diagnosis not present

## 2018-05-16 DIAGNOSIS — M25551 Pain in right hip: Secondary | ICD-10-CM | POA: Diagnosis not present

## 2018-05-16 DIAGNOSIS — M5136 Other intervertebral disc degeneration, lumbar region: Secondary | ICD-10-CM | POA: Diagnosis not present

## 2018-05-25 DIAGNOSIS — M0589 Other rheumatoid arthritis with rheumatoid factor of multiple sites: Secondary | ICD-10-CM | POA: Diagnosis not present

## 2018-06-20 DIAGNOSIS — G40119 Localization-related (focal) (partial) symptomatic epilepsy and epileptic syndromes with simple partial seizures, intractable, without status epilepticus: Secondary | ICD-10-CM | POA: Diagnosis not present

## 2018-06-20 DIAGNOSIS — M130 Polyarthritis, unspecified: Secondary | ICD-10-CM | POA: Diagnosis not present

## 2018-06-20 DIAGNOSIS — E876 Hypokalemia: Secondary | ICD-10-CM | POA: Diagnosis not present

## 2018-06-20 DIAGNOSIS — I10 Essential (primary) hypertension: Secondary | ICD-10-CM | POA: Diagnosis not present

## 2018-06-22 ENCOUNTER — Encounter: Payer: Self-pay | Admitting: Cardiology

## 2018-06-22 ENCOUNTER — Ambulatory Visit (INDEPENDENT_AMBULATORY_CARE_PROVIDER_SITE_OTHER): Payer: Medicare Other | Admitting: Cardiology

## 2018-06-22 ENCOUNTER — Ambulatory Visit: Payer: Self-pay | Admitting: Cardiology

## 2018-06-22 VITALS — BP 166/88 | HR 72 | Ht 65.0 in | Wt 210.1 lb

## 2018-06-22 DIAGNOSIS — I25119 Atherosclerotic heart disease of native coronary artery with unspecified angina pectoris: Secondary | ICD-10-CM | POA: Diagnosis not present

## 2018-06-22 DIAGNOSIS — I10 Essential (primary) hypertension: Secondary | ICD-10-CM

## 2018-06-22 DIAGNOSIS — R55 Syncope and collapse: Secondary | ICD-10-CM

## 2018-06-22 DIAGNOSIS — K219 Gastro-esophageal reflux disease without esophagitis: Secondary | ICD-10-CM | POA: Diagnosis not present

## 2018-06-22 DIAGNOSIS — M069 Rheumatoid arthritis, unspecified: Secondary | ICD-10-CM | POA: Insufficient documentation

## 2018-06-22 DIAGNOSIS — M1990 Unspecified osteoarthritis, unspecified site: Secondary | ICD-10-CM | POA: Insufficient documentation

## 2018-06-22 DIAGNOSIS — R12 Heartburn: Secondary | ICD-10-CM | POA: Insufficient documentation

## 2018-06-22 MED ORDER — METOPROLOL TARTRATE 25 MG PO TABS: 25.0000 mg | ORAL_TABLET | Freq: Two times a day (BID) | ORAL | 1 refills | Status: DC
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
## 2018-06-23 ENCOUNTER — Encounter: Payer: Self-pay | Admitting: Gastroenterology

## 2018-06-27 DIAGNOSIS — M0589 Other rheumatoid arthritis with rheumatoid factor of multiple sites: Secondary | ICD-10-CM | POA: Diagnosis not present

## 2018-06-27 DIAGNOSIS — Z79899 Other long term (current) drug therapy: Secondary | ICD-10-CM | POA: Diagnosis not present

## 2018-07-06 ENCOUNTER — Other Ambulatory Visit: Payer: Self-pay | Admitting: Cardiology

## 2018-07-06 DIAGNOSIS — K219 Gastro-esophageal reflux disease without esophagitis: Secondary | ICD-10-CM

## 2018-07-11 DIAGNOSIS — E669 Obesity, unspecified: Secondary | ICD-10-CM | POA: Diagnosis not present

## 2018-07-11 DIAGNOSIS — M0589 Other rheumatoid arthritis with rheumatoid factor of multiple sites: Secondary | ICD-10-CM | POA: Diagnosis not present

## 2018-07-11 DIAGNOSIS — Z6835 Body mass index (BMI) 35.0-35.9, adult: Secondary | ICD-10-CM | POA: Diagnosis not present

## 2018-07-11 DIAGNOSIS — M06341 Rheumatoid nodule, right hand: Secondary | ICD-10-CM | POA: Diagnosis not present

## 2018-07-11 DIAGNOSIS — M25562 Pain in left knee: Secondary | ICD-10-CM | POA: Diagnosis not present

## 2018-07-11 DIAGNOSIS — M5136 Other intervertebral disc degeneration, lumbar region: Secondary | ICD-10-CM | POA: Diagnosis not present

## 2018-07-11 DIAGNOSIS — M150 Primary generalized (osteo)arthritis: Secondary | ICD-10-CM | POA: Diagnosis not present

## 2018-07-18 ENCOUNTER — Ambulatory Visit (INDEPENDENT_AMBULATORY_CARE_PROVIDER_SITE_OTHER): Payer: Medicare Other | Admitting: Gastroenterology

## 2018-07-18 ENCOUNTER — Encounter: Payer: Self-pay | Admitting: Gastroenterology

## 2018-07-18 VITALS — BP 138/76 | HR 72 | Ht 65.0 in | Wt 211.0 lb

## 2018-07-18 DIAGNOSIS — I25119 Atherosclerotic heart disease of native coronary artery with unspecified angina pectoris: Secondary | ICD-10-CM | POA: Diagnosis not present

## 2018-07-18 DIAGNOSIS — R142 Eructation: Secondary | ICD-10-CM

## 2018-07-18 DIAGNOSIS — R12 Heartburn: Secondary | ICD-10-CM | POA: Diagnosis not present

## 2018-07-18 MED ORDER — FAMOTIDINE 20 MG PO TABS: 20.0000 mg | ORAL_TABLET | Freq: Every day | ORAL | 1 refills | Status: DC
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
## 2018-07-18 NOTE — Patient Instructions (Addendum)
If you are age 80 or older, your body mass index should be between 23-30. Your Body mass index is 35.11 kg/m. If this is out of the aforementioned range listed, please consider follow up with your Primary Care Provider.  If you are age 76 or younger, your body mass index should be between 19-25. Your Body mass index is 35.11 kg/m. If this is out of the aformentioned range listed, please consider follow up with your Primary Care Provider.   Please decrease pantoprazole to once a day.  After this medication is run out. Please start Famotidine 20 mg once a day.   It was a pleasure to see you today!  Dr. Loletha Carrow

## 2018-07-25 DIAGNOSIS — M0589 Other rheumatoid arthritis with rheumatoid factor of multiple sites: Secondary | ICD-10-CM | POA: Diagnosis not present

## 2018-08-01 DIAGNOSIS — I10 Essential (primary) hypertension: Secondary | ICD-10-CM | POA: Diagnosis not present

## 2018-08-01 DIAGNOSIS — E1169 Type 2 diabetes mellitus with other specified complication: Secondary | ICD-10-CM | POA: Diagnosis not present

## 2018-08-01 DIAGNOSIS — R569 Unspecified convulsions: Secondary | ICD-10-CM | POA: Diagnosis not present

## 2018-08-01 DIAGNOSIS — Z6835 Body mass index (BMI) 35.0-35.9, adult: Secondary | ICD-10-CM | POA: Diagnosis not present

## 2018-08-01 DIAGNOSIS — M069 Rheumatoid arthritis, unspecified: Secondary | ICD-10-CM | POA: Diagnosis not present

## 2018-08-03 ENCOUNTER — Ambulatory Visit: Payer: Medicare Other | Admitting: Cardiology

## 2018-08-16 ENCOUNTER — Other Ambulatory Visit: Payer: Self-pay | Admitting: Cardiology

## 2018-08-22 DIAGNOSIS — M0589 Other rheumatoid arthritis with rheumatoid factor of multiple sites: Secondary | ICD-10-CM | POA: Diagnosis not present

## 2018-09-07 ENCOUNTER — Encounter: Payer: Self-pay | Admitting: Cardiology

## 2018-09-07 ENCOUNTER — Ambulatory Visit (INDEPENDENT_AMBULATORY_CARE_PROVIDER_SITE_OTHER): Payer: Medicare Other | Admitting: Cardiology

## 2018-09-07 ENCOUNTER — Other Ambulatory Visit: Payer: Self-pay

## 2018-09-07 VITALS — BP 154/68 | HR 83 | Ht 65.0 in | Wt 210.0 lb

## 2018-09-07 DIAGNOSIS — I10 Essential (primary) hypertension: Secondary | ICD-10-CM | POA: Diagnosis not present

## 2018-09-07 DIAGNOSIS — R0601 Orthopnea: Secondary | ICD-10-CM | POA: Diagnosis not present

## 2018-09-07 DIAGNOSIS — K219 Gastro-esophageal reflux disease without esophagitis: Secondary | ICD-10-CM | POA: Diagnosis not present

## 2018-09-07 DIAGNOSIS — R0600 Dyspnea, unspecified: Secondary | ICD-10-CM

## 2018-09-07 DIAGNOSIS — I25119 Atherosclerotic heart disease of native coronary artery with unspecified angina pectoris: Secondary | ICD-10-CM | POA: Diagnosis not present

## 2018-09-07 DIAGNOSIS — R0609 Other forms of dyspnea: Secondary | ICD-10-CM | POA: Diagnosis not present

## 2018-09-07 DIAGNOSIS — R55 Syncope and collapse: Secondary | ICD-10-CM

## 2018-09-07 MED ORDER — METOPROLOL TARTRATE 50 MG PO TABS: 50.0000 mg | ORAL_TABLET | Freq: Two times a day (BID) | ORAL | 2 refills | Status: DC
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
## 2018-09-13 ENCOUNTER — Ambulatory Visit (INDEPENDENT_AMBULATORY_CARE_PROVIDER_SITE_OTHER): Payer: Medicare Other | Admitting: Cardiology

## 2018-09-13 ENCOUNTER — Ambulatory Visit (INDEPENDENT_AMBULATORY_CARE_PROVIDER_SITE_OTHER): Payer: Medicare Other

## 2018-09-13 ENCOUNTER — Other Ambulatory Visit: Payer: Self-pay

## 2018-09-13 ENCOUNTER — Encounter: Payer: Self-pay | Admitting: Cardiology

## 2018-09-13 VITALS — BP 106/58 | HR 50 | Ht 65.0 in | Wt 212.2 lb

## 2018-09-13 DIAGNOSIS — R0609 Other forms of dyspnea: Secondary | ICD-10-CM

## 2018-09-13 DIAGNOSIS — I10 Essential (primary) hypertension: Secondary | ICD-10-CM | POA: Diagnosis not present

## 2018-09-13 DIAGNOSIS — I25119 Atherosclerotic heart disease of native coronary artery with unspecified angina pectoris: Secondary | ICD-10-CM | POA: Diagnosis not present

## 2018-09-13 DIAGNOSIS — I25118 Atherosclerotic heart disease of native coronary artery with other forms of angina pectoris: Secondary | ICD-10-CM

## 2018-09-13 DIAGNOSIS — R0600 Dyspnea, unspecified: Secondary | ICD-10-CM

## 2018-09-14 ENCOUNTER — Encounter: Payer: Self-pay | Admitting: Cardiology

## 2018-09-14 DIAGNOSIS — I25118 Atherosclerotic heart disease of native coronary artery with other forms of angina pectoris: Secondary | ICD-10-CM | POA: Insufficient documentation

## 2018-09-14 DIAGNOSIS — I10 Essential (primary) hypertension: Secondary | ICD-10-CM | POA: Insufficient documentation

## 2018-09-19 DIAGNOSIS — M0589 Other rheumatoid arthritis with rheumatoid factor of multiple sites: Secondary | ICD-10-CM | POA: Diagnosis not present

## 2018-10-12 ENCOUNTER — Other Ambulatory Visit: Payer: Self-pay

## 2018-10-12 ENCOUNTER — Ambulatory Visit (INDEPENDENT_AMBULATORY_CARE_PROVIDER_SITE_OTHER): Payer: Medicare Other | Admitting: Cardiology

## 2018-10-12 DIAGNOSIS — I10 Essential (primary) hypertension: Secondary | ICD-10-CM | POA: Diagnosis not present

## 2018-10-12 DIAGNOSIS — I25118 Atherosclerotic heart disease of native coronary artery with other forms of angina pectoris: Secondary | ICD-10-CM
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
## 2018-10-15 ENCOUNTER — Encounter: Payer: Self-pay | Admitting: Cardiology

## 2018-10-16 ENCOUNTER — Telehealth: Payer: Self-pay

## 2018-10-16 DIAGNOSIS — Z20822 Contact with and (suspected) exposure to covid-19: Secondary | ICD-10-CM

## 2018-10-16 NOTE — Telephone Encounter (Signed)
Incoming call from [facility], referring Patient for Covid-19 testing.  Telephone call to Patient.  To schedule testing.  Test will be Friday, 6/12/ 20 @ 0830.  Patient voices understanding.

## 2018-10-17 ENCOUNTER — Other Ambulatory Visit: Payer: Self-pay | Admitting: Cardiology

## 2018-10-17 DIAGNOSIS — I2510 Atherosclerotic heart disease of native coronary artery without angina pectoris: Secondary | ICD-10-CM

## 2018-10-17 DIAGNOSIS — M0589 Other rheumatoid arthritis with rheumatoid factor of multiple sites: Secondary | ICD-10-CM | POA: Diagnosis not present

## 2018-10-17 DIAGNOSIS — I208 Other forms of angina pectoris: Secondary | ICD-10-CM

## 2018-10-19 ENCOUNTER — Other Ambulatory Visit: Payer: Self-pay | Admitting: Cardiology

## 2018-10-19 DIAGNOSIS — I25118 Atherosclerotic heart disease of native coronary artery with other forms of angina pectoris: Secondary | ICD-10-CM

## 2018-10-19 DIAGNOSIS — Z20822 Contact with and (suspected) exposure to covid-19: Secondary | ICD-10-CM

## 2018-10-20 ENCOUNTER — Other Ambulatory Visit: Payer: Medicare Other

## 2018-10-20 ENCOUNTER — Other Ambulatory Visit: Payer: Self-pay

## 2018-10-20 DIAGNOSIS — Z20822 Contact with and (suspected) exposure to covid-19: Secondary | ICD-10-CM

## 2018-10-20 DIAGNOSIS — R6889 Other general symptoms and signs: Secondary | ICD-10-CM | POA: Diagnosis not present

## 2018-10-20 LAB — CBC
Hematocrit: 34.4 % (ref 34.0–46.6)
Hemoglobin: 11.6 g/dL (ref 11.1–15.9)
MCH: 27.5 pg (ref 26.6–33.0)
MCHC: 33.7 g/dL (ref 31.5–35.7)
MCV: 82 fL (ref 79–97)
Platelets: 270 10*3/uL (ref 150–450)
RBC: 4.22 x10E6/uL (ref 3.77–5.28)
RDW: 13.7 % (ref 11.7–15.4)
WBC: 11.8 10*3/uL — ABNORMAL HIGH (ref 3.4–10.8)

## 2018-10-20 LAB — BASIC METABOLIC PANEL
BUN/Creatinine Ratio: 17 (ref 12–28)
BUN: 13 mg/dL (ref 8–27)
CO2: 26 mmol/L (ref 20–29)
Calcium: 8.5 mg/dL — ABNORMAL LOW (ref 8.7–10.3)
Chloride: 103 mmol/L (ref 96–106)
Creatinine, Ser: 0.75 mg/dL (ref 0.57–1.00)
GFR calc Af Amer: 87 mL/min/{1.73_m2} (ref >59)
GFR calc non Af Amer: 76 mL/min/{1.73_m2} (ref >59)
Glucose: 125 mg/dL — ABNORMAL HIGH (ref 65–99)
Potassium: 3.7 mmol/L (ref 3.5–5.2)
Sodium: 142 mmol/L (ref 134–144)

## 2018-10-23 ENCOUNTER — Other Ambulatory Visit: Payer: Self-pay

## 2018-10-23 ENCOUNTER — Other Ambulatory Visit (HOSPITAL_COMMUNITY)
Admission: RE | Admit: 2018-10-23 | Discharge: 2018-10-23 | Disposition: A | Discharge status: Home / Self Care | Payer: Medicare Other | Source: Ambulatory Visit | Attending: Cardiology | Admitting: Cardiology

## 2018-10-23 DIAGNOSIS — Z1159 Encounter for screening for other viral diseases: Secondary | ICD-10-CM | POA: Insufficient documentation

## 2018-10-23 LAB — SARS CORONAVIRUS 2 BY RT PCR (HOSPITAL ORDER, PERFORMED IN ~~LOC~~ HOSPITAL LAB): SARS Coronavirus 2: NEGATIVE

## 2018-10-24 ENCOUNTER — Ambulatory Visit (HOSPITAL_COMMUNITY)
Admission: RE | Admit: 2018-10-24 | Discharge: 2018-10-24 | Disposition: A | Discharge status: Home / Self Care | Payer: Medicare Other | Attending: Cardiology | Admitting: Cardiology

## 2018-10-24 ENCOUNTER — Other Ambulatory Visit: Payer: Self-pay

## 2018-10-24 ENCOUNTER — Encounter (HOSPITAL_COMMUNITY)
Admission: RE | Disposition: A | Discharge status: Home / Self Care | Payer: Self-pay | Source: Home / Self Care | Attending: Cardiology

## 2018-10-24 DIAGNOSIS — E669 Obesity, unspecified: Secondary | ICD-10-CM | POA: Diagnosis not present

## 2018-10-24 DIAGNOSIS — Z7984 Long term (current) use of oral hypoglycemic drugs: Secondary | ICD-10-CM | POA: Diagnosis not present

## 2018-10-24 DIAGNOSIS — Z9221 Personal history of antineoplastic chemotherapy: Secondary | ICD-10-CM | POA: Insufficient documentation

## 2018-10-24 DIAGNOSIS — Z79899 Other long term (current) drug therapy: Secondary | ICD-10-CM | POA: Insufficient documentation

## 2018-10-24 DIAGNOSIS — I208 Other forms of angina pectoris: Secondary | ICD-10-CM | POA: Diagnosis not present

## 2018-10-24 DIAGNOSIS — I10 Essential (primary) hypertension: Secondary | ICD-10-CM | POA: Diagnosis not present

## 2018-10-24 DIAGNOSIS — I25118 Atherosclerotic heart disease of native coronary artery with other forms of angina pectoris: Secondary | ICD-10-CM | POA: Insufficient documentation

## 2018-10-24 DIAGNOSIS — G40909 Epilepsy, unspecified, not intractable, without status epilepticus: Secondary | ICD-10-CM | POA: Diagnosis not present

## 2018-10-24 DIAGNOSIS — E119 Type 2 diabetes mellitus without complications: Secondary | ICD-10-CM | POA: Diagnosis not present

## 2018-10-24 DIAGNOSIS — R079 Chest pain, unspecified: Secondary | ICD-10-CM | POA: Diagnosis present

## 2018-10-24 DIAGNOSIS — Z86718 Personal history of other venous thrombosis and embolism: Secondary | ICD-10-CM | POA: Diagnosis not present

## 2018-10-24 DIAGNOSIS — M069 Rheumatoid arthritis, unspecified: Secondary | ICD-10-CM | POA: Insufficient documentation

## 2018-10-24 DIAGNOSIS — Z85038 Personal history of other malignant neoplasm of large intestine: Secondary | ICD-10-CM | POA: Diagnosis not present

## 2018-10-24 DIAGNOSIS — Z6834 Body mass index (BMI) 34.0-34.9, adult: Secondary | ICD-10-CM | POA: Insufficient documentation

## 2018-10-24 DIAGNOSIS — Z7982 Long term (current) use of aspirin: Secondary | ICD-10-CM | POA: Insufficient documentation

## 2018-10-24 HISTORY — PX: INTRAVASCULAR PRESSURE WIRE/FFR STUDY: CATH118243

## 2018-10-24 HISTORY — PX: LEFT HEART CATH AND CORONARY ANGIOGRAPHY: CATH118249

## 2018-10-24 LAB — NOVEL CORONAVIRUS, NAA: SARS-CoV-2, NAA: NOT DETECTED

## 2018-10-24 LAB — GLUCOSE, CAPILLARY: Glucose-Capillary: 110 mg/dL — ABNORMAL HIGH (ref 70–99)

## 2018-10-24 LAB — POCT ACTIVATED CLOTTING TIME: Activated Clotting Time: 307 seconds

## 2018-10-24 SURGERY — LEFT HEART CATH AND CORONARY ANGIOGRAPHY
Anesthesia: LOCAL

## 2018-10-24 MED ORDER — SODIUM CHLORIDE 0.9 % IV SOLN: 250.0000 mL | INTRAVENOUS | Status: DC | PRN

## 2018-10-24 MED ORDER — IOHEXOL 350 MG/ML SOLN
INTRAVENOUS | Status: DC | PRN
  Administered 2018-10-24: 95 mL via INTRAVENOUS

## 2018-10-24 MED ORDER — DIAZEPAM 5 MG PO TABS
ORAL_TABLET | ORAL | Status: AC
  Filled 2018-10-24: qty 1

## 2018-10-24 MED ORDER — NITROGLYCERIN 1 MG/10 ML FOR IR/CATH LAB
INTRA_ARTERIAL | Status: DC | PRN
  Administered 2018-10-24 (×3): 200 ug via INTRACORONARY

## 2018-10-24 MED ORDER — HEPARIN SODIUM (PORCINE) 1000 UNIT/ML IJ SOLN
INTRAMUSCULAR | Status: AC
  Filled 2018-10-24: qty 1

## 2018-10-24 MED ORDER — VERAPAMIL HCL 2.5 MG/ML IV SOLN
INTRA_ARTERIAL | Status: DC | PRN
  Administered 2018-10-24: 10 mL via INTRA_ARTERIAL

## 2018-10-24 MED ORDER — SODIUM CHLORIDE 0.9 % WEIGHT BASED INFUSION: 1.0000 mL/kg/h | INTRAVENOUS | Status: DC

## 2018-10-24 MED ORDER — HEPARIN SODIUM (PORCINE) 1000 UNIT/ML IJ SOLN
INTRAMUSCULAR | Status: DC | PRN
  Administered 2018-10-24 (×2): 5000 [IU] via INTRAVENOUS
  Administered 2018-10-24: 3000 [IU] via INTRAVENOUS

## 2018-10-24 MED ORDER — VERAPAMIL HCL 2.5 MG/ML IV SOLN
INTRAVENOUS | Status: AC
  Filled 2018-10-24: qty 2

## 2018-10-24 MED ORDER — LIDOCAINE HCL (PF) 1 % IJ SOLN
INTRAMUSCULAR | Status: AC
  Filled 2018-10-24: qty 30

## 2018-10-24 MED ORDER — HEPARIN (PORCINE) IN NACL 1000-0.9 UT/500ML-% IV SOLN
INTRAVENOUS | Status: DC | PRN
  Administered 2018-10-24 (×2): 500 mL

## 2018-10-24 MED ORDER — ASPIRIN 81 MG PO CHEW: 81.0000 mg | CHEWABLE_TABLET | ORAL | Status: DC

## 2018-10-24 MED ORDER — FENTANYL CITRATE (PF) 100 MCG/2ML IJ SOLN
INTRAMUSCULAR | Status: AC
  Filled 2018-10-24: qty 2

## 2018-10-24 MED ORDER — SODIUM CHLORIDE 0.9% FLUSH: 3.0000 mL | INTRAVENOUS | Status: DC | PRN

## 2018-10-24 MED ORDER — MIDAZOLAM HCL 2 MG/2ML IJ SOLN
INTRAMUSCULAR | Status: AC
  Filled 2018-10-24: qty 2

## 2018-10-24 MED ORDER — SODIUM CHLORIDE 0.9 % WEIGHT BASED INFUSION
3.0000 mL/kg/h | INTRAVENOUS | Status: AC
  Administered 2018-10-24: 3 mL/kg/h via INTRAVENOUS

## 2018-10-24 MED ORDER — HEPARIN (PORCINE) IN NACL 1000-0.9 UT/500ML-% IV SOLN
INTRAVENOUS | Status: AC
  Filled 2018-10-24: qty 1000

## 2018-10-24 MED ORDER — DIAZEPAM 5 MG PO TABS
5.0000 mg | ORAL_TABLET | Freq: Once | ORAL | Status: AC
  Administered 2018-10-24: 5 mg via ORAL

## 2018-10-24 MED ORDER — LIDOCAINE HCL (PF) 1 % IJ SOLN
INTRAMUSCULAR | Status: DC | PRN
  Administered 2018-10-24: 2 mL

## 2018-10-24 MED ORDER — NITROGLYCERIN 1 MG/10 ML FOR IR/CATH LAB
INTRA_ARTERIAL | Status: AC
  Filled 2018-10-24: qty 10

## 2018-10-24 MED ORDER — SODIUM CHLORIDE 0.9% FLUSH: 3.0000 mL | Freq: Two times a day (BID) | INTRAVENOUS | Status: DC

## 2018-10-24 MED ORDER — FENTANYL CITRATE (PF) 100 MCG/2ML IJ SOLN
INTRAMUSCULAR | Status: DC | PRN
  Administered 2018-10-24: 50 ug via INTRAVENOUS
  Administered 2018-10-24: 25 ug via INTRAVENOUS

## 2018-10-24 MED ORDER — MIDAZOLAM HCL 2 MG/2ML IJ SOLN
INTRAMUSCULAR | Status: DC | PRN
  Administered 2018-10-24 (×2): 1 mg via INTRAVENOUS

## 2018-10-24 SURGICAL SUPPLY — 16 items
BALLN EMERGE MR 2.5X8 (BALLOONS) ×2
BALLOON EMERGE MR 2.5X8 (BALLOONS) IMPLANT
CATH LAUNCHER 6FR EBU3.5 (CATHETERS) ×1 IMPLANT
CATH OPTITORQUE TIG 4.0 5F (CATHETERS) ×1 IMPLANT
DEVICE RAD COMP TR BAND LRG (VASCULAR PRODUCTS) ×1 IMPLANT
GLIDESHEATH SLEND A-KIT 6F 22G (SHEATH) ×1 IMPLANT
GUIDEWIRE INQWIRE 1.5J.035X260 (WIRE) IMPLANT
GUIDEWIRE PRESSURE COMET II (WIRE) ×1 IMPLANT
INQWIRE 1.5J .035X260CM (WIRE) ×2
KIT ESSENTIALS PG (KITS) ×1 IMPLANT
KIT HEART LEFT (KITS) ×2 IMPLANT
KIT HEMO VALVE WATCHDOG (MISCELLANEOUS) ×1 IMPLANT
PACK CARDIAC CATHETERIZATION (CUSTOM PROCEDURE TRAY) ×2 IMPLANT
TRANSDUCER W/STOPCOCK (MISCELLANEOUS) ×2 IMPLANT
TUBING CIL FLEX 10 FLL-RA (TUBING) ×2 IMPLANT
WIRE ASAHI PROWATER 180CM (WIRE) ×1 IMPLANT

## 2018-10-24 NOTE — Interval H&P Note (Signed)
History and Physical Interval Note:  10/24/2018 10:22 AM  Melissa Reynolds  has presented today for surgery, with the diagnosis of abnormal stress - angina.  The various methods of treatment have been discussed with the patient and family. After consideration of risks, benefits and other options for treatment, the patient has consented to  Procedure(s): LEFT HEART CATH AND CORONARY ANGIOGRAPHY (N/A) as a surgical intervention.  The patient's history has been reviewed, patient examined, no change in status, stable for surgery.  I have reviewed the patient's chart and labs.  Questions were answered to the patient's satisfaction.     2016/2017 Appropriate Use Criteria for Coronary Revascularization Clinical Presentation: Diabetes Mellitus? Symptom Status? S/P CABG? Antianginal Therapy (# of long-acting drugs)? Results of Non-invasive testing? FFR/iFR results in all diseased vessels? Patient undergoing renal transplant? Patient undergoing percutaneous valve procedure (TAVR, MitraClip, Others)? Symptom Status:  Ischemic Symptoms  Non-invasive Testing:  Intermediate Risk  If no or indeterminate stress test, FFR/iFR results in all diseased vessels:  N/A  Diabetes Mellitus:  No  S/P CABG:  No  Antianginal therapy (number of long-acting drugs):  >=2  Patient undergoing renal transplant:  No  Patient undergoing percutaneous valve procedure:  No    newline 1 Vessel Disease PCI CABG  No proximal LAD involvement, No proximal left dominant LCX involvement A (8); Indication 2 M (6); Indication 2   Proximal left dominant LCX involvement A (8); Indication 5 A (8); Indication 5   Proximal LAD involvement A (8); Indication 5 A (8); Indication 5   newline 2 Vessel Disease  No proximal LAD involvement A (8); Indication 8 A (7); Indication 8   Proximal LAD involvement A (8); Indication 11 A (8); Indication 11   newline 3 Vessel Disease  Low disease complexity (e.g., focal stenoses, SYNTAX <=22) A (8);  Indication 17 A (8); Indication 17   Intermediate or high disease complexity (e.g., SYNTAX >=23) M (6); Indication 21 A (9); Indication 21   newline Left Main Disease  Isolated LMCA disease: ostial or midshaft A (7); Indication 24 A (9); Indication 24   Isolated LMCA disease: bifurcation involvement M (6); Indication 25 A (9); Indication 25   LMCA ostial or midshaft, concurrent low disease burden multivessel disease (e.g., 1-2 additional focal stenoses, SYNTAX <=22) A (7); Indication 26 A (9); Indication 26   LMCA ostial or midshaft, concurrent intermediate or high disease burden multivessel disease (e.g., 1-2 additional bifurcation stenoses, long stenoses, SYNTAX >=23) M (4); Indication 27 A (9); Indication 27   LMCA bifurcation involvement, concurrent low disease burden multivessel disease (e.g., 1-2 additional focal stenoses, SYNTAX <=22) M (6); Indication 28 A (9); Indication 28   LMCA bifurcation involvement, concurrent intermediate or high disease burden multivessel disease (e.g., 1-2 additional bifurcation stenoses, long stenoses, SYNTAX >=23) R (3); Indication 29 A (9); Indication [place]

## 2018-10-24 NOTE — Discharge Instructions (Signed)
Radial Site Care ° °This sheet gives you information about how to care for yourself after your procedure. Your health care provider may also give you more specific instructions. If you have problems or questions, contact your health care provider. °What can I expect after the procedure? °After the procedure, it is common to have: °· Bruising and tenderness at the catheter insertion area. °Follow these instructions at home: °Medicines °· Take over-the-counter and prescription medicines only as told by your health care provider. °Insertion site care °· Follow instructions from your health care provider about how to take care of your insertion site. Make sure you: °? Wash your hands with soap and water before you change your bandage (dressing). If soap and water are not available, use hand sanitizer. °? Change your dressing as told by your health care provider. °? Leave stitches (sutures), skin glue, or adhesive strips in place. These skin closures may need to stay in place for 2 weeks or longer. If adhesive strip edges start to loosen and curl up, you may trim the loose edges. Do not remove adhesive strips completely unless your health care provider tells you to do that. °· Check your insertion site every day for signs of infection. Check for: °? Redness, swelling, or pain. °? Fluid or blood. °? Pus or a bad smell. °? Warmth. °· Do not take baths, swim, or use a hot tub until your health care provider approves. °· You may shower 24-48 hours after the procedure, or as directed by your health care provider. °? Remove the dressing and gently wash the site with plain soap and water. °? Pat the area dry with a clean towel. °? Do not rub the site. That could cause bleeding. °· Do not apply powder or lotion to the site. °Activity ° °· For 24 hours after the procedure, or as directed by your health care provider: °? Do not flex or bend the affected arm. °? Do not push or pull heavy objects with the affected arm. °? Do not  drive yourself home from the hospital or clinic. You may drive 24 hours after the procedure unless your health care provider tells you not to. °? Do not operate machinery or power tools. °· Do not lift anything that is heavier than 10 lb (4.5 kg), or the limit that you are told, until your health care provider says that it is safe. °· Ask your health care provider when it is okay to: °? Return to work or school. °? Resume usual physical activities or sports. °? Resume sexual activity. °General instructions °· If the catheter site starts to bleed, raise your arm and put firm pressure on the site. If the bleeding does not stop, get help right away. This is a medical emergency. °· If you went home on the same day as your procedure, a responsible adult should be with you for the first 24 hours after you arrive home. °· Keep all follow-up visits as told by your health care provider. This is important. °Contact a health care provider if: °· You have a fever. °· You have redness, swelling, or yellow drainage around your insertion site. °Get help right away if: °· You have unusual pain at the radial site. °· The catheter insertion area swells very fast. °· The insertion area is bleeding, and the bleeding does not stop when you hold steady pressure on the area. °· Your arm or hand becomes pale, cool, tingly, or numb. °These symptoms may represent a serious problem   that is an emergency. Do not wait to see if the symptoms will go away. Get medical help right away. Call your local emergency services (911 in the [place] Do not drive yourself to the hospital. °Summary °· After the procedure, it is common to have bruising and tenderness at the site. °· Follow instructions from your health care provider about how to take care of your radial site wound. Check the wound every day for signs of infection. °· Do not lift anything that is heavier than 10 lb (4.5 kg), or the limit that you are told, until your health care provider says  that it is safe. °This information is not intended to replace advice given to you by your health care provider. Make sure you discuss any questions you have with your health care provider. °Document Released: 05/29/2010 Document Revised: 06/01/2017 Document Reviewed: 06/01/2017 °Elsevier Interactive Patient Education © 2019 [employer] ° °
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
## 2018-10-24 NOTE — Progress Notes (Signed)
D/c instructions given to sister Charleston Ropes via telephone due to visitor restrictions.  All questions answered and Daisy verbalized understanding

## 2018-10-25 ENCOUNTER — Encounter (HOSPITAL_COMMUNITY): Payer: Self-pay | Admitting: Cardiology

## 2018-11-02 ENCOUNTER — Other Ambulatory Visit: Payer: Self-pay | Admitting: Cardiology

## 2018-11-02 ENCOUNTER — Encounter: Payer: Self-pay | Admitting: Cardiology

## 2018-11-02 ENCOUNTER — Other Ambulatory Visit: Payer: Self-pay

## 2018-11-02 ENCOUNTER — Ambulatory Visit (INDEPENDENT_AMBULATORY_CARE_PROVIDER_SITE_OTHER): Payer: Medicare Other | Admitting: Cardiology

## 2018-11-02 VITALS — BP 131/71 | HR 74 | Ht 65.0 in | Wt 218.2 lb

## 2018-11-02 DIAGNOSIS — I10 Essential (primary) hypertension: Secondary | ICD-10-CM

## 2018-11-02 DIAGNOSIS — I25118 Atherosclerotic heart disease of native coronary artery with other forms of angina pectoris: Secondary | ICD-10-CM | POA: Diagnosis not present
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
## 2018-11-14 DIAGNOSIS — M0589 Other rheumatoid arthritis with rheumatoid factor of multiple sites: Secondary | ICD-10-CM | POA: Diagnosis not present

## 2018-11-16 ENCOUNTER — Other Ambulatory Visit: Payer: Self-pay

## 2018-11-16 ENCOUNTER — Inpatient Hospital Stay (HOSPITAL_COMMUNITY): Payer: Medicare Other | Attending: Hematology

## 2018-11-16 DIAGNOSIS — I10 Essential (primary) hypertension: Secondary | ICD-10-CM | POA: Insufficient documentation

## 2018-11-16 DIAGNOSIS — M545 Low back pain: Secondary | ICD-10-CM | POA: Insufficient documentation

## 2018-11-16 DIAGNOSIS — C189 Malignant neoplasm of colon, unspecified: Secondary | ICD-10-CM

## 2018-11-16 DIAGNOSIS — Z85038 Personal history of other malignant neoplasm of large intestine: Secondary | ICD-10-CM | POA: Diagnosis not present

## 2018-11-16 DIAGNOSIS — E119 Type 2 diabetes mellitus without complications: Secondary | ICD-10-CM | POA: Insufficient documentation

## 2018-11-16 DIAGNOSIS — Z9221 Personal history of antineoplastic chemotherapy: Secondary | ICD-10-CM | POA: Insufficient documentation

## 2018-11-16 DIAGNOSIS — Z7982 Long term (current) use of aspirin: Secondary | ICD-10-CM | POA: Diagnosis not present

## 2018-11-16 DIAGNOSIS — R718 Other abnormality of red blood cells: Secondary | ICD-10-CM

## 2018-11-16 DIAGNOSIS — G4700 Insomnia, unspecified: Secondary | ICD-10-CM | POA: Diagnosis not present

## 2018-11-16 DIAGNOSIS — R569 Unspecified convulsions: Secondary | ICD-10-CM | POA: Insufficient documentation

## 2018-11-16 DIAGNOSIS — Z79899 Other long term (current) drug therapy: Secondary | ICD-10-CM | POA: Insufficient documentation

## 2018-11-16 DIAGNOSIS — E669 Obesity, unspecified: Secondary | ICD-10-CM | POA: Diagnosis not present

## 2018-11-16 DIAGNOSIS — Z7984 Long term (current) use of oral hypoglycemic drugs: Secondary | ICD-10-CM | POA: Insufficient documentation

## 2018-11-16 LAB — COMPREHENSIVE METABOLIC PANEL
ALT: 16 U/L (ref 0–44)
AST: 16 U/L (ref 15–41)
Albumin: 3.4 g/dL — ABNORMAL LOW (ref 3.5–5.0)
Alkaline Phosphatase: 62 U/L (ref 38–126)
Anion gap: 10 (ref 5–15)
BUN: 14 mg/dL (ref 8–23)
CO2: 28 mmol/L (ref 22–32)
Calcium: 8.7 mg/dL — ABNORMAL LOW (ref 8.9–10.3)
Chloride: 101 mmol/L (ref 98–111)
Creatinine, Ser: 0.69 mg/dL (ref 0.44–1.00)
GFR calc Af Amer: >60 mL/min (ref >60)
GFR calc non Af Amer: >60 mL/min (ref >60)
Glucose, Bld: 195 mg/dL — ABNORMAL HIGH (ref 70–99)
Potassium: 3.5 mmol/L (ref 3.5–5.1)
Sodium: 139 mmol/L (ref 135–145)
Total Bilirubin: 0.7 mg/dL (ref 0.3–1.2)
Total Protein: 6.7 g/dL (ref 6.5–8.1)

## 2018-11-16 LAB — CBC WITH DIFFERENTIAL/PLATELET
Abs Immature Granulocytes: 0.05 10*3/uL (ref 0.00–0.07)
Basophils Absolute: 0 10*3/uL (ref 0.0–0.1)
Basophils Relative: 0 %
Eosinophils Absolute: 0.1 10*3/uL (ref 0.0–0.5)
Eosinophils Relative: 1 %
HCT: 35.4 % — ABNORMAL LOW (ref 36.0–46.0)
Hemoglobin: 12.1 g/dL (ref 12.0–15.0)
Immature Granulocytes: 1 %
Lymphocytes Relative: 17 %
Lymphs Abs: 1.7 10*3/uL (ref 0.7–4.0)
MCH: 26.8 pg (ref 26.0–34.0)
MCHC: 34.2 g/dL (ref 30.0–36.0)
MCV: 78.5 fL — ABNORMAL LOW (ref 80.0–100.0)
Monocytes Absolute: 0.5 10*3/uL (ref 0.1–1.0)
Monocytes Relative: 5 %
Neutro Abs: 8 10*3/uL — ABNORMAL HIGH (ref 1.7–7.7)
Neutrophils Relative %: 76 %
Platelets: 297 10*3/uL (ref 150–400)
RBC: 4.51 MIL/uL (ref 3.87–5.11)
RDW: 13 % (ref 11.5–15.5)
WBC: 10.4 10*3/uL (ref 4.0–10.5)
nRBC: 0 % (ref 0.0–0.2)

## 2018-11-16 LAB — FERRITIN: Ferritin: 34 ng/mL (ref 11–307)

## 2018-11-16 LAB — LACTATE DEHYDROGENASE: LDH: 157 U/L (ref 98–192)

## 2018-11-16 NOTE — Progress Notes (Signed)
For review.  Please update ordering provider

## 2018-11-21 LAB — HEMOGLOBINOPATHY EVALUATION
Hgb A2 Quant: 3.5 % — ABNORMAL HIGH (ref 1.8–3.2)
Hgb A: 63.2 % — ABNORMAL LOW (ref 96.4–98.8)
Hgb C: 33.3 % — ABNORMAL HIGH
Hgb F Quant: 0 % (ref 0.0–2.0)
Hgb S Quant: 0 %
Hgb Variant: 0 %

## 2018-11-23 ENCOUNTER — Encounter (HOSPITAL_COMMUNITY): Payer: Self-pay | Admitting: Hematology

## 2018-11-23 ENCOUNTER — Other Ambulatory Visit: Payer: Self-pay

## 2018-11-23 ENCOUNTER — Inpatient Hospital Stay (HOSPITAL_BASED_OUTPATIENT_CLINIC_OR_DEPARTMENT_OTHER): Payer: Medicare Other | Admitting: Hematology

## 2018-11-23 VITALS — BP 110/44 | HR 48 | Temp 98.2°F | Resp 16 | Wt 220.2 lb

## 2018-11-23 DIAGNOSIS — M545 Low back pain: Secondary | ICD-10-CM | POA: Diagnosis not present

## 2018-11-23 DIAGNOSIS — I10 Essential (primary) hypertension: Secondary | ICD-10-CM | POA: Diagnosis not present

## 2018-11-23 DIAGNOSIS — Z9221 Personal history of antineoplastic chemotherapy: Secondary | ICD-10-CM

## 2018-11-23 DIAGNOSIS — Z85038 Personal history of other malignant neoplasm of large intestine: Secondary | ICD-10-CM

## 2018-11-23 DIAGNOSIS — G4700 Insomnia, unspecified: Secondary | ICD-10-CM | POA: Diagnosis not present

## 2018-11-23 DIAGNOSIS — Z79899 Other long term (current) drug therapy: Secondary | ICD-10-CM

## 2018-11-23 DIAGNOSIS — R569 Unspecified convulsions: Secondary | ICD-10-CM | POA: Diagnosis not present

## 2018-11-23 DIAGNOSIS — C189 Malignant neoplasm of colon, unspecified: Secondary | ICD-10-CM

## 2018-11-23 NOTE — Patient Instructions (Signed)
[place] at [facility] Discharge Instructions  You were seen today by Dr. Delton Coombes. He went over your recent lab results. He will see you back after your MRI for follow up.   Thank you for choosing [place] at [facility] to provide your oncology and hematology care.  To afford each patient quality time with our provider, please arrive at least 15 minutes before your scheduled appointment time.   If you have a lab appointment with the [place] please come in thru the  Main Entrance and check in at the main information desk  You need to re-schedule your appointment should you arrive 10 or more minutes late.  We strive to give you quality time with our providers, and arriving late affects you and other patients whose appointments are after yours.  Also, if you no show three or more times for appointments you may be dismissed from the clinic at the providers discretion.     Again, thank you for choosing [facility].  Our hope is that these requests will decrease the amount of time that you wait before being seen by our physicians.       _____________________________________________________________  Should you have questions after your visit to [facility], please contact our office at [phone] between the hours of 8:00 a.m. and 4:30 p.m.  Voicemails left after 4:00 p.m. will not be returned until the following business day.  For prescription refill requests, have your pharmacy contact our office and allow 72 hours.    Cancer Center Support Programs:   > Cancer Support Group  2nd Tuesday of the month 1pm-2pm, Journey Room

## 2018-11-23 NOTE — Assessment & Plan Note (Addendum)
1.  Stage III colon cancer: -Diagnosed in November 2001, with 2 out of 4 lymph nodes positive with lymphovascular invasion. -She was treated adjuvantly with 8 cycles of FOLFIRI. - Last colonoscopy was in late 2015 in [place]. -Denies any bleeding per rectum or melena.  Denies any stool changes. - Complains of pain in the back which is worse in the last couple of months. -Last CT abdomen and pelvis renal study on 03/17/2018 did not show any evidence of metastatic disease. -Physical exam today did not reveal any abdominal masses.  Blood work was within normal limits.  2.  Microcytosis: -She has a normal hemoglobin with MCV of 78. 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  3.  Low back pain: -She is complaining of low back pain worse in the last couple of months.  Sometimes it is getting harder to walk and go up the steps. 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  Increased bilateral facet arthritis. -Given her history of malignancy, I will do MRI of the lumbar spine with and without contrast.  We will see her back after the scan to discuss results.
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
## 2018-11-24 NOTE — Progress Notes (Signed)
For review.  Please update ordering provider

## 2018-11-29 ENCOUNTER — Ambulatory Visit (HOSPITAL_COMMUNITY)
Admission: RE | Admit: 2018-11-29 | Discharge: 2018-11-29 | Disposition: A | Discharge status: Home / Self Care | Payer: Medicare Other | Source: Ambulatory Visit | Attending: Hematology | Admitting: Hematology

## 2018-11-29 ENCOUNTER — Other Ambulatory Visit: Payer: Self-pay

## 2018-11-29 DIAGNOSIS — C189 Malignant neoplasm of colon, unspecified: Secondary | ICD-10-CM

## 2018-11-29 DIAGNOSIS — M48061 Spinal stenosis, lumbar region without neurogenic claudication: Secondary | ICD-10-CM | POA: Diagnosis not present

## 2018-11-29 DIAGNOSIS — M5126 Other intervertebral disc displacement, lumbar region: Secondary | ICD-10-CM | POA: Diagnosis not present

## 2018-11-29 MED ORDER — GADOBUTROL 1 MMOL/ML IV SOLN
10.0000 mL | Freq: Once | INTRAVENOUS | Status: AC | PRN
  Administered 2018-11-29: 10 mL via INTRAVENOUS

## 2018-11-30 ENCOUNTER — Encounter (HOSPITAL_COMMUNITY): Payer: Self-pay | Admitting: Hematology

## 2018-11-30 ENCOUNTER — Inpatient Hospital Stay (HOSPITAL_BASED_OUTPATIENT_CLINIC_OR_DEPARTMENT_OTHER): Payer: Medicare Other | Admitting: Hematology

## 2018-11-30 VITALS — BP 119/54 | HR 54 | Temp 97.7°F | Resp 16 | Wt 218.0 lb

## 2018-11-30 DIAGNOSIS — M545 Low back pain: Secondary | ICD-10-CM

## 2018-11-30 DIAGNOSIS — Z85038 Personal history of other malignant neoplasm of large intestine: Secondary | ICD-10-CM

## 2018-11-30 DIAGNOSIS — C189 Malignant neoplasm of colon, unspecified: Secondary | ICD-10-CM

## 2018-11-30 DIAGNOSIS — R569 Unspecified convulsions: Secondary | ICD-10-CM | POA: Diagnosis not present

## 2018-11-30 DIAGNOSIS — G4700 Insomnia, unspecified: Secondary | ICD-10-CM | POA: Diagnosis not present

## 2018-11-30 DIAGNOSIS — Z9221 Personal history of antineoplastic chemotherapy: Secondary | ICD-10-CM | POA: Diagnosis not present

## 2018-11-30 DIAGNOSIS — I10 Essential (primary) hypertension: Secondary | ICD-10-CM | POA: Diagnosis not present
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
## 2018-11-30 NOTE — Patient Instructions (Signed)
[facility] at [facility] Discharge Instructions  You were seen today by Dr. Katragadda. He went over your recent test results. He will see you back in 1 year for labs and follow up.   Thank you for choosing [facility] at [facility] to provide your oncology and hematology care.  To afford each patient quality time with our provider, please arrive at least 15 minutes before your scheduled appointment time.   If you have a lab appointment with the Cancer Center please come in thru the  Main Entrance and check in at the main information desk  You need to re-schedule your appointment should you arrive 10 or more minutes late.  We strive to give you quality time with our providers, and arriving late affects you and other patients whose appointments are after yours.  Also, if you no show three or more times for appointments you may be dismissed from the clinic at the providers discretion.     Again, thank you for choosing [facility].  Our hope is that these requests will decrease the amount of time that you wait before being seen by our physicians.       _____________________________________________________________  Should you have questions after your visit to [facility], please contact our office at [phone] between the hours of 8:00 a.m. and 4:30 p.m.  Voicemails left after 4:00 p.m. will not be returned until the following business day.  For prescription refill requests, have your pharmacy contact our office and allow 72 hours.    Cancer Center Support Programs:   > Cancer Support Group  2nd Tuesday of the month 1pm-2pm, Journey Room    

## 2018-11-30 NOTE — Assessment & Plan Note (Signed)
1.  Stage III colon cancer: -Diagnosed in November 2001, with 2 out of 4 lymph nodes positive with lymphovascular invasion. -She was treated adjuvantly with 8 cycles of FOLFIRI. - Last colonoscopy was in late 2015 in [place]. -Denies any bleeding per rectum or melena.  Denies any stool changes. -CTAP renal study on 03/17/2018 did not show any evidence of metastatic disease. -Blood work and physical exam were within normal limits.  We will see her back in 1 year for follow-up.  We will repeat a CEA level prior to next visit.  2.  Microcytosis: -She has a normal hemoglobin with MCV of 78. 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  3.  Low back pain: -She complained of low back pain worse in the last couple of months. 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  Increased bilateral facet arthritis.   - I have reviewed results of the MRI of the lumbar spine with and without contrast dated 11/29/2018 which showed no change in the 0.5 cm anterolisthesis L4 on L5 due to facet arthropathy.  Severe central canal and bilateral subarticular recess narrowing at L4-5 also appear unchanged.  Moderate left foraminal narrowing has slightly progressed. -I have recommended referral to a back specialist, as her pain is interfering with walking.

## 2018-12-01 ENCOUNTER — Telehealth: Payer: Self-pay | Admitting: Orthopedic Surgery

## 2018-12-01 NOTE — Telephone Encounter (Signed)
We re not bringing in a non emergent urgent nursing home patient for back pain  Re check with [place] in 6 weeks if appt in person available, virtual visit will not help

## 2018-12-01 NOTE — Telephone Encounter (Signed)
Please review referral from [facility] for patient for problem of back pain regarding scheduling; also if in-person or telephone. Patient's primary care is Dr [name].

## 2018-12-03 ENCOUNTER — Other Ambulatory Visit: Payer: Self-pay | Admitting: Cardiology

## 2018-12-04 NOTE — Telephone Encounter (Signed)
Called back to patient; aware. Noted in referral workqueue as well.

## 2018-12-12 DIAGNOSIS — M0589 Other rheumatoid arthritis with rheumatoid factor of multiple sites: Secondary | ICD-10-CM | POA: Diagnosis not present

## 2018-12-14 ENCOUNTER — Telehealth: Payer: Self-pay

## 2018-12-14 ENCOUNTER — Other Ambulatory Visit: Payer: Self-pay

## 2018-12-14 DIAGNOSIS — R609 Edema, unspecified: Secondary | ICD-10-CM

## 2018-12-14 MED ORDER — FUROSEMIDE 20 MG PO TABS: 20.0000 mg | ORAL_TABLET | Freq: Every day | ORAL | 3 refills | Status: DC

## 2018-12-14 NOTE — Telephone Encounter (Signed)
May use lasix 20 mg daily. Please send 30 pills with 3 refills.  Thanks MJP

## 2018-12-14 NOTE — Telephone Encounter (Signed)
Telephone encounter:  Reason for call: both feet swelling 4-5 days; she has been keeping them elevated no other symptoms   Usual provider: MP   Last office visit: 11/02/2018  Next office visit: 02/08/2019   Last hospitalization:    Current Outpatient Medications on File Prior to Visit  Medication Sig Dispense Refill  . acetaminophen (TYLENOL) 500 MG tablet Take 1,000 mg by mouth every 6 (six) hours as needed for moderate pain or headache.    Marland Kitchen amLODipine (NORVASC) 5 MG tablet Take 5 mg by mouth 2 (two) times daily.     Marland Kitchen aspirin EC 81 MG tablet Take 81 mg by mouth daily.    . cholecalciferol (VITAMIN D) 25 MCG (1000 UT) tablet Take 2,000 Units by mouth daily.     . hydrochlorothiazide (HYDRODIURIL) 25 MG tablet Take 25 mg by mouth daily.     . isosorbide mononitrate (IMDUR) 60 MG 24 hr tablet Take 60 mg by mouth daily.     Marland Kitchen losartan (COZAAR) 100 MG tablet Take 100 mg by mouth daily.     . metFORMIN (GLUCOPHAGE) 500 MG tablet Take 500 mg by mouth 2 (two) times daily with a meal.    . nitroGLYCERIN (NITROSTAT) 0.4 MG SL tablet Place 0.4 mg under the tongue every 5 (five) minutes x 3 doses as needed.     . pantoprazole (PROTONIX) 40 MG tablet Take 1 tablet by mouth once daily 30 tablet 0  . potassium chloride 20 MEQ TBCR Take 20 mEq by mouth 2 (two) times daily. 60 tablet 0  . predniSONE (DELTASONE) 5 MG tablet Take 5 mg by mouth daily.      No current facility-administered medications on file prior to visit.

## 2018-12-20 DIAGNOSIS — M5136 Other intervertebral disc degeneration, lumbar region: Secondary | ICD-10-CM | POA: Diagnosis not present

## 2018-12-20 DIAGNOSIS — M150 Primary generalized (osteo)arthritis: Secondary | ICD-10-CM | POA: Diagnosis not present

## 2018-12-20 DIAGNOSIS — Z6836 Body mass index (BMI) 36.0-36.9, adult: Secondary | ICD-10-CM | POA: Diagnosis not present

## 2018-12-20 DIAGNOSIS — M06341 Rheumatoid nodule, right hand: Secondary | ICD-10-CM | POA: Diagnosis not present

## 2018-12-20 DIAGNOSIS — E669 Obesity, unspecified: Secondary | ICD-10-CM | POA: Diagnosis not present

## 2018-12-20 DIAGNOSIS — M0589 Other rheumatoid arthritis with rheumatoid factor of multiple sites: Secondary | ICD-10-CM | POA: Diagnosis not present

## 2018-12-20 DIAGNOSIS — M25562 Pain in left knee: Secondary | ICD-10-CM | POA: Diagnosis not present

## 2018-12-29 ENCOUNTER — Encounter: Payer: Self-pay | Admitting: Orthopedic Surgery

## 2019-01-06 ENCOUNTER — Other Ambulatory Visit: Payer: Self-pay | Admitting: Cardiology

## 2019-01-09 DIAGNOSIS — M0589 Other rheumatoid arthritis with rheumatoid factor of multiple sites: Secondary | ICD-10-CM | POA: Diagnosis not present

## 2019-01-30 DIAGNOSIS — I10 Essential (primary) hypertension: Secondary | ICD-10-CM | POA: Diagnosis not present

## 2019-01-30 DIAGNOSIS — E1169 Type 2 diabetes mellitus with other specified complication: Secondary | ICD-10-CM | POA: Diagnosis not present

## 2019-01-30 DIAGNOSIS — M545 Low back pain: Secondary | ICD-10-CM | POA: Diagnosis not present

## 2019-01-31 ENCOUNTER — Other Ambulatory Visit: Payer: Self-pay | Admitting: Orthopedic Surgery

## 2019-01-31 ENCOUNTER — Other Ambulatory Visit: Payer: Self-pay

## 2019-01-31 ENCOUNTER — Ambulatory Visit (INDEPENDENT_AMBULATORY_CARE_PROVIDER_SITE_OTHER): Payer: Medicare Other | Admitting: Orthopedic Surgery

## 2019-01-31 ENCOUNTER — Encounter: Payer: Self-pay | Admitting: Orthopedic Surgery

## 2019-01-31 VITALS — BP 130/76 | HR 86 | Temp 97.1°F | Ht 65.0 in | Wt 214.0 lb

## 2019-01-31 DIAGNOSIS — I25119 Atherosclerotic heart disease of native coronary artery with unspecified angina pectoris: Secondary | ICD-10-CM | POA: Diagnosis not present

## 2019-01-31 DIAGNOSIS — M48062 Spinal stenosis, lumbar region with neurogenic claudication: Secondary | ICD-10-CM
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
## 2019-01-31 NOTE — Patient Instructions (Signed)
Call [facility] imaging for the injections [phone] ask for [facility]

## 2019-02-05 ENCOUNTER — Other Ambulatory Visit: Payer: Medicare Other

## 2019-02-06 ENCOUNTER — Other Ambulatory Visit: Payer: Self-pay

## 2019-02-06 ENCOUNTER — Ambulatory Visit
Admission: RE | Admit: 2019-02-06 | Discharge: 2019-02-06 | Disposition: A | Discharge status: Home / Self Care | Payer: Medicare Other | Source: Ambulatory Visit | Attending: Orthopedic Surgery | Admitting: Orthopedic Surgery

## 2019-02-06 DIAGNOSIS — M48061 Spinal stenosis, lumbar region without neurogenic claudication: Secondary | ICD-10-CM | POA: Diagnosis not present

## 2019-02-06 DIAGNOSIS — M0589 Other rheumatoid arthritis with rheumatoid factor of multiple sites: Secondary | ICD-10-CM | POA: Diagnosis not present

## 2019-02-06 DIAGNOSIS — M48062 Spinal stenosis, lumbar region with neurogenic claudication: Secondary | ICD-10-CM

## 2019-02-06 DIAGNOSIS — Z79899 Other long term (current) drug therapy: Secondary | ICD-10-CM | POA: Diagnosis not present

## 2019-02-06 MED ORDER — METHYLPREDNISOLONE ACETATE 40 MG/ML INJ SUSP (RADIOLOG
120.0000 mg | Freq: Once | INTRAMUSCULAR | Status: AC
  Administered 2019-02-06: 120 mg via EPIDURAL

## 2019-02-06 MED ORDER — IOPAMIDOL (ISOVUE-M 200) INJECTION 41%
1.0000 mL | Freq: Once | INTRAMUSCULAR | Status: AC
  Administered 2019-02-06: 1 mL via EPIDURAL

## 2019-02-06 NOTE — Discharge Instructions (Signed)

## 2019-02-08 ENCOUNTER — Other Ambulatory Visit: Payer: Self-pay

## 2019-02-08 ENCOUNTER — Ambulatory Visit: Payer: Medicare Other | Admitting: Cardiology

## 2019-02-08 DIAGNOSIS — Z20822 Contact with and (suspected) exposure to covid-19: Secondary | ICD-10-CM

## 2019-02-08 NOTE — Progress Notes (Signed)
No show

## 2019-02-09 ENCOUNTER — Telehealth: Payer: Self-pay | Admitting: General Practice

## 2019-02-09 LAB — NOVEL CORONAVIRUS, NAA: SARS-CoV-2, NAA: NOT DETECTED

## 2019-02-09 NOTE — Telephone Encounter (Signed)
Patient informed of negative covid-19 result. Patient verbalized understanding.  °

## 2019-02-17 ENCOUNTER — Other Ambulatory Visit: Payer: Self-pay | Admitting: Cardiology
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
## 2019-03-01 ENCOUNTER — Ambulatory Visit (INDEPENDENT_AMBULATORY_CARE_PROVIDER_SITE_OTHER): Payer: Medicare Other | Admitting: Cardiology

## 2019-03-01 ENCOUNTER — Encounter: Payer: Self-pay | Admitting: Cardiology

## 2019-03-01 ENCOUNTER — Other Ambulatory Visit: Payer: Self-pay

## 2019-03-01 VITALS — BP 128/65 | HR 53 | Temp 96.9°F | Ht 65.0 in | Wt 219.0 lb

## 2019-03-01 DIAGNOSIS — R5383 Other fatigue: Secondary | ICD-10-CM | POA: Insufficient documentation

## 2019-03-01 DIAGNOSIS — I10 Essential (primary) hypertension: Secondary | ICD-10-CM

## 2019-03-01 DIAGNOSIS — I25118 Atherosclerotic heart disease of native coronary artery with other forms of angina pectoris: Secondary | ICD-10-CM | POA: Diagnosis not present

## 2019-03-06 DIAGNOSIS — M0589 Other rheumatoid arthritis with rheumatoid factor of multiple sites: Secondary | ICD-10-CM | POA: Diagnosis not present

## 2019-03-08 ENCOUNTER — Ambulatory Visit: Payer: Medicare Other | Admitting: Cardiology

## 2019-03-15 DIAGNOSIS — M150 Primary generalized (osteo)arthritis: Secondary | ICD-10-CM | POA: Diagnosis not present

## 2019-03-15 DIAGNOSIS — M25562 Pain in left knee: Secondary | ICD-10-CM | POA: Diagnosis not present

## 2019-03-15 DIAGNOSIS — Z6836 Body mass index (BMI) 36.0-36.9, adult: Secondary | ICD-10-CM | POA: Diagnosis not present

## 2019-03-15 DIAGNOSIS — M0589 Other rheumatoid arthritis with rheumatoid factor of multiple sites: Secondary | ICD-10-CM | POA: Diagnosis not present

## 2019-03-15 DIAGNOSIS — M06341 Rheumatoid nodule, right hand: Secondary | ICD-10-CM | POA: Diagnosis not present

## 2019-03-15 DIAGNOSIS — E669 Obesity, unspecified: Secondary | ICD-10-CM | POA: Diagnosis not present

## 2019-03-15 DIAGNOSIS — M5136 Other intervertebral disc degeneration, lumbar region: Secondary | ICD-10-CM | POA: Diagnosis not present

## 2019-04-03 DIAGNOSIS — M0589 Other rheumatoid arthritis with rheumatoid factor of multiple sites: Secondary | ICD-10-CM | POA: Diagnosis not present

## 2019-04-13 DIAGNOSIS — R5383 Other fatigue: Secondary | ICD-10-CM | POA: Diagnosis not present

## 2019-04-14 LAB — TSH: TSH: 4.22 u[IU]/mL (ref 0.450–4.500)

## 2019-04-16 ENCOUNTER — Other Ambulatory Visit: Payer: Self-pay | Admitting: Cardiology

## 2019-04-16 NOTE — Progress Notes (Signed)
Called and spoke with patient regarding TSH results. CM, CMA

## 2019-05-17 DIAGNOSIS — Z79899 Other long term (current) drug therapy: Secondary | ICD-10-CM | POA: Diagnosis not present

## 2019-05-17 DIAGNOSIS — M0589 Other rheumatoid arthritis with rheumatoid factor of multiple sites: Secondary | ICD-10-CM | POA: Diagnosis not present

## 2019-05-24 ENCOUNTER — Other Ambulatory Visit: Payer: Self-pay | Admitting: Cardiology

## 2019-05-24 DIAGNOSIS — R609 Edema, unspecified: Secondary | ICD-10-CM

## 2019-05-25 DIAGNOSIS — Z23 Encounter for immunization: Secondary | ICD-10-CM | POA: Diagnosis not present

## 2019-06-14 DIAGNOSIS — M0589 Other rheumatoid arthritis with rheumatoid factor of multiple sites: Secondary | ICD-10-CM | POA: Diagnosis not present

## 2019-06-14 DIAGNOSIS — Z79899 Other long term (current) drug therapy: Secondary | ICD-10-CM | POA: Diagnosis not present

## 2019-06-22 DIAGNOSIS — Z23 Encounter for immunization: Secondary | ICD-10-CM | POA: Diagnosis not present

## 2019-07-12 DIAGNOSIS — M0589 Other rheumatoid arthritis with rheumatoid factor of multiple sites: Secondary | ICD-10-CM | POA: Diagnosis not present

## 2019-07-17 ENCOUNTER — Other Ambulatory Visit: Payer: Self-pay | Admitting: Cardiology

## 2019-07-17 DIAGNOSIS — R609 Edema, unspecified: Secondary | ICD-10-CM

## 2019-08-09 DIAGNOSIS — M0589 Other rheumatoid arthritis with rheumatoid factor of multiple sites: Secondary | ICD-10-CM | POA: Diagnosis not present

## 2019-08-24 ENCOUNTER — Ambulatory Visit: Payer: Medicare Other | Admitting: Orthopedic Surgery

## 2019-08-30 ENCOUNTER — Ambulatory Visit: Payer: Medicare Other | Admitting: Cardiology

## 2019-09-06 DIAGNOSIS — M0589 Other rheumatoid arthritis with rheumatoid factor of multiple sites: Secondary | ICD-10-CM | POA: Diagnosis not present

## 2019-09-06 DIAGNOSIS — Z79899 Other long term (current) drug therapy: Secondary | ICD-10-CM | POA: Diagnosis not present

## 2019-09-13 DIAGNOSIS — M5136 Other intervertebral disc degeneration, lumbar region: Secondary | ICD-10-CM | POA: Diagnosis not present

## 2019-09-13 DIAGNOSIS — M25562 Pain in left knee: Secondary | ICD-10-CM | POA: Diagnosis not present

## 2019-09-13 DIAGNOSIS — M06341 Rheumatoid nodule, right hand: Secondary | ICD-10-CM | POA: Diagnosis not present

## 2019-09-13 DIAGNOSIS — M0589 Other rheumatoid arthritis with rheumatoid factor of multiple sites: Secondary | ICD-10-CM | POA: Diagnosis not present

## 2019-09-13 DIAGNOSIS — M150 Primary generalized (osteo)arthritis: Secondary | ICD-10-CM | POA: Diagnosis not present

## 2019-09-13 DIAGNOSIS — E669 Obesity, unspecified: Secondary | ICD-10-CM | POA: Diagnosis not present

## 2019-09-13 DIAGNOSIS — Z6836 Body mass index (BMI) 36.0-36.9, adult: Secondary | ICD-10-CM | POA: Diagnosis not present

## 2019-09-14 ENCOUNTER — Other Ambulatory Visit: Payer: Self-pay | Admitting: Cardiology

## 2019-09-24 DIAGNOSIS — I10 Essential (primary) hypertension: Secondary | ICD-10-CM | POA: Diagnosis not present

## 2019-09-24 DIAGNOSIS — N390 Urinary tract infection, site not specified: Secondary | ICD-10-CM | POA: Diagnosis not present

## 2019-09-24 DIAGNOSIS — R0602 Shortness of breath: Secondary | ICD-10-CM | POA: Diagnosis not present

## 2019-09-24 DIAGNOSIS — R569 Unspecified convulsions: Secondary | ICD-10-CM | POA: Diagnosis not present

## 2019-09-24 DIAGNOSIS — E1169 Type 2 diabetes mellitus with other specified complication: Secondary | ICD-10-CM | POA: Diagnosis not present

## 2019-09-24 DIAGNOSIS — R600 Localized edema: Secondary | ICD-10-CM | POA: Diagnosis not present

## 2019-09-25 ENCOUNTER — Ambulatory Visit (HOSPITAL_COMMUNITY)
Admission: RE | Admit: 2019-09-25 | Discharge: 2019-09-25 | Disposition: A | Discharge status: Home / Self Care | Payer: Medicare Other | Source: Ambulatory Visit | Attending: Family Medicine | Admitting: Family Medicine

## 2019-09-25 ENCOUNTER — Other Ambulatory Visit: Payer: Self-pay

## 2019-09-25 ENCOUNTER — Other Ambulatory Visit (HOSPITAL_COMMUNITY): Payer: Self-pay | Admitting: Family Medicine

## 2019-09-25 DIAGNOSIS — R0602 Shortness of breath: Secondary | ICD-10-CM | POA: Insufficient documentation

## 2019-10-01 ENCOUNTER — Telehealth: Payer: Self-pay

## 2019-10-01 ENCOUNTER — Other Ambulatory Visit: Payer: Self-pay

## 2019-10-01 DIAGNOSIS — R609 Edema, unspecified: Secondary | ICD-10-CM

## 2019-10-01 MED ORDER — FUROSEMIDE 20 MG PO TABS: 20.0000 mg | ORAL_TABLET | Freq: Every day | ORAL | 1 refills | Status: DC

## 2019-10-01 NOTE — Telephone Encounter (Signed)
Telephone encounter: pt c/o her legs and feet are swelling for a few weeks now. She is not eating salt and is trying to elevate them.   Reason for call: Swelling   Usual provider: MP  Last office visit: 03/01/19  Next office visit: 10/18/19   Last hospitalization:NA  Current Outpatient Medications on File Prior to Visit  Medication Sig Dispense Refill  . acetaminophen (TYLENOL) 500 MG tablet Take 1,000 mg by mouth every 6 (six) hours as needed for moderate pain or headache.    Marland Kitchen amLODipine (NORVASC) 5 MG tablet Take 5 mg by mouth 2 (two) times daily.     Marland Kitchen aspirin EC 81 MG tablet Take 81 mg by mouth daily.    . cholecalciferol (VITAMIN D) 25 MCG (1000 UT) tablet Take 2,000 Units by mouth daily.     . furosemide (LASIX) 20 MG tablet Take 1 tablet by mouth once daily 90 tablet 0  . hydrochlorothiazide (HYDRODIURIL) 25 MG tablet Take 25 mg by mouth daily.     . isosorbide mononitrate (IMDUR) 60 MG 24 hr tablet Take 1 tablet by mouth once daily 90 tablet 0  . losartan (COZAAR) 100 MG tablet Take 100 mg by mouth daily.     . metFORMIN (GLUCOPHAGE) 500 MG tablet Take 500 mg by mouth 2 (two) times daily with a meal.    . metoprolol tartrate (LOPRESSOR) 50 MG tablet Take 1 tablet by mouth twice daily 180 tablet 0  . nitroGLYCERIN (NITROSTAT) 0.4 MG SL tablet Place 0.4 mg under the tongue every 5 (five) minutes x 3 doses as needed.     . pantoprazole (PROTONIX) 40 MG tablet Take 1 tablet by mouth once daily 90 tablet 0  . potassium chloride 20 MEQ TBCR Take 20 mEq by mouth 2 (two) times daily. 60 tablet 0  . predniSONE (DELTASONE) 5 MG tablet Take 5 mg by mouth daily.      No current facility-administered medications on file prior to visit.

## 2019-10-01 NOTE — Telephone Encounter (Signed)
Add lasix 20 mg daily 30 pills 1 refill. Keep appt with me 6/10.  Thanks MJP

## 2019-10-17 DIAGNOSIS — M0589 Other rheumatoid arthritis with rheumatoid factor of multiple sites: Secondary | ICD-10-CM | POA: Diagnosis not present

## 2019-10-17 NOTE — Progress Notes (Signed)
Follow up visit  Subjective:   Melissa Reynolds, female    DOB: July 10, 1940, 79 y.o.   MRN: [id number]   Chief complaint: Chest pain  79 year old [place] American female with hypertension, type II diabetes mellitus, moderate CAD, history of colon cancer status post surgery in 2001 followed by chemotherapy, currently in remission, rheumatoid arthritis, on Keppra for seizure disorder.  Given patients angina episodes, she underwent coronary angiography on 10/21/2018, that showed unchanged focal 60% distal LAD lesion. I attempted to perform physiological study in this lesion, but was unable to wire the vessel due to tortuosity. More prominent was slow flow in all vessels, suggestive of microvascular dysfunction.  Patient is here for follow up today. She denies any chest pain, but has had increasing fatigue and lack of energy. She has had some leg swelling lately. A1C is elevated. Blood pressure is well controlled.     Current Outpatient Medications on File Prior to Visit  Medication Sig Dispense Refill  . amLODipine (NORVASC) 5 MG tablet Take 5 mg by mouth 2 (two) times daily.     Marland Kitchen aspirin EC 81 MG tablet Take 81 mg by mouth daily.    . cholecalciferol (VITAMIN D) 25 MCG (1000 UT) tablet Take 2,000 Units by mouth daily.     . cyclobenzaprine (FLEXERIL) 10 MG tablet Take 10 mg by mouth 3 (three) times daily as needed for muscle spasms.    . metFORMIN (GLUCOPHAGE) 500 MG tablet Take 500 mg by mouth 2 (two) times daily with a meal.    . potassium chloride (KLOR-CON) 10 MEQ tablet Take 1 tablet by mouth 2 (two) times a week.    . predniSONE (DELTASONE) 5 MG tablet Take 5 mg by mouth daily.     Marland Kitchen acetaminophen (TYLENOL) 500 MG tablet Take 1,000 mg by mouth every 6 (six) hours as needed for moderate pain or headache.    . furosemide (LASIX) 20 MG tablet Take 1 tablet (20 mg total) by mouth daily. 30 tablet 1  . hydrochlorothiazide (HYDRODIURIL) 25 MG tablet Take 25 mg by mouth daily.     .  isosorbide mononitrate (IMDUR) 60 MG 24 hr tablet Take 1 tablet by mouth once daily 90 tablet 0  . losartan (COZAAR) 100 MG tablet Take 100 mg by mouth daily.     . metoprolol tartrate (LOPRESSOR) 50 MG tablet Take 1 tablet by mouth twice daily 180 tablet 0  . nitroGLYCERIN (NITROSTAT) 0.4 MG SL tablet Place 0.4 mg under the tongue every 5 (five) minutes x 3 doses as needed.     . pantoprazole (PROTONIX) 40 MG tablet Take 1 tablet by mouth once daily 90 tablet 0   No current facility-administered medications on file prior to visit.    Cardiovascular studies:  EKG 10/18/2019: Sinus rhythm 65 bpm  Poor R-wave progression Low voltage  Coronary angiography 10/24/2018: Extremely tortuous vessels with distal LAD focal 60% stenosis, unchanged compared to prior. TIMI II flow seen in all vessels that improved with IC NTG Her chest pain is likely due to microvascular dysfunction, rather than the stable moderate distal LAD lesion.  Echocardiogram 09/13/2018: Left ventricle cavity is normal in size. Mild concentric hypertrophy of the left ventricle. Normal global wall motion. Doppler evidence of grade I (impaired) diastolic dysfunction, normal LAP. Calculated EF 63%.  Mild (Grade I) mitral regurgitation.  Mild tricuspid regurgitation. Estimated pulmonary artery systolic pressure 22 mmHg.  Mild pulmonic regurgitation. No significant change compared to previous study on 04/05/2017.  EKG 03/29/2019: Sinus rhythm 76 bpm. Poor R wave progression. Otherwise normal EKG   Recent labs: 09/24/2019: Glucose 188, BUN/Cr 18/0.9. eGFR 58. HbA1C 8.1%  04/2019: TSH 4.1   11/27/2018: Glucose 195. BUN/Cr 14/0.69. eGFR >60. Na/K 139/3.5. Rest of the CMP normal.  H/H 12/35. MCV 78. Platelets 297.   08/01/2018: Chol 150, TG 143, HDL 63, LDL 64  Review of Systems  Cardiovascular: Negative for chest pain, dyspnea on exertion, leg swelling, palpitations and syncope.       Today's Vitals   10/18/19 0911   BP: 138/64  Pulse: 64  SpO2: 95%  Weight: 220 lb 12.8 oz (100.2 kg)  Height: 5' 5"  (1.651 m)   Body mass index is 36.74 kg/m.   Objective:  Physical Exam Vitals and nursing note reviewed.  Constitutional:      General: She is not in acute distress. Neck:     Vascular: No JVD.  Cardiovascular:     Rate and Rhythm: Normal rate and regular rhythm.     Pulses: Intact distal pulses.     Heart sounds: Normal heart sounds. No murmur heard.   Pulmonary:     Effort: Pulmonary effort is normal.     Breath sounds: Normal breath sounds. No wheezing or rales.         Assessment & Recommendations:    79 year old African American female with hypertension, type II diabetes mellitus, nonobstructive CAD, history of colon cancer status post surgery in 2001 followed by chemotherapy, currently in remission, rheumatoid arthritis, on Keppra for seizure disorder  CAD: No angina symptoms. Continue Aspirin 81 mg. Noticing that she has inadvertently come off statin, started simvastatin 20 mg. Continue current antianginal therapy.    Hypertension: Well controlled.  DM: Uncontrolled. In light of A1C of 8.1% and nonobstructive CAD, started Jardiance 10 mg. This may also help with edema. Stopped lasix to avoid dehydration. Discussed potential side effects with the patient.  Follow up in 6 months.    Nigel Mormon, MD [facility] Pager: [phone] Office: [phone] If no answer Cell [phone]

## 2019-10-18 ENCOUNTER — Encounter: Payer: Self-pay | Admitting: Cardiology

## 2019-10-18 ENCOUNTER — Other Ambulatory Visit: Payer: Self-pay

## 2019-10-18 ENCOUNTER — Ambulatory Visit: Payer: Medicare Other | Admitting: Cardiology

## 2019-10-18 ENCOUNTER — Ambulatory Visit (INDEPENDENT_AMBULATORY_CARE_PROVIDER_SITE_OTHER): Payer: Medicare Other | Admitting: Cardiology

## 2019-10-18 VITALS — BP 138/64 | HR 64 | Ht 65.0 in | Wt 220.8 lb

## 2019-10-18 DIAGNOSIS — I10 Essential (primary) hypertension: Secondary | ICD-10-CM | POA: Diagnosis not present

## 2019-10-18 DIAGNOSIS — E1165 Type 2 diabetes mellitus with hyperglycemia: Secondary | ICD-10-CM | POA: Diagnosis not present

## 2019-10-18 DIAGNOSIS — I25119 Atherosclerotic heart disease of native coronary artery with unspecified angina pectoris: Secondary | ICD-10-CM | POA: Diagnosis not present

## 2019-10-18 DIAGNOSIS — I25118 Atherosclerotic heart disease of native coronary artery with other forms of angina pectoris: Secondary | ICD-10-CM

## 2019-10-18 MED ORDER — SIMVASTATIN 20 MG PO TABS: 20.0000 mg | ORAL_TABLET | Freq: Every day | ORAL | 3 refills | Status: DC

## 2019-10-18 MED ORDER — EMPAGLIFLOZIN 10 MG PO TABS: 10.0000 mg | ORAL_TABLET | Freq: Every day | ORAL | 2 refills | Status: AC

## 2019-10-22 DIAGNOSIS — E1169 Type 2 diabetes mellitus with other specified complication: Secondary | ICD-10-CM | POA: Diagnosis not present

## 2019-10-22 DIAGNOSIS — R569 Unspecified convulsions: Secondary | ICD-10-CM | POA: Diagnosis not present

## 2019-10-22 DIAGNOSIS — I10 Essential (primary) hypertension: Secondary | ICD-10-CM | POA: Diagnosis not present

## 2019-11-07 ENCOUNTER — Ambulatory Visit: Payer: Medicare Other | Admitting: Cardiology

## 2019-11-10 ENCOUNTER — Other Ambulatory Visit: Payer: Self-pay | Admitting: Cardiology

## 2019-11-26 ENCOUNTER — Inpatient Hospital Stay (HOSPITAL_COMMUNITY): Payer: Medicare Other | Attending: Hematology

## 2019-11-26 ENCOUNTER — Other Ambulatory Visit: Payer: Self-pay

## 2019-11-26 DIAGNOSIS — Z7984 Long term (current) use of oral hypoglycemic drugs: Secondary | ICD-10-CM | POA: Insufficient documentation

## 2019-11-26 DIAGNOSIS — Z86718 Personal history of other venous thrombosis and embolism: Secondary | ICD-10-CM | POA: Diagnosis not present

## 2019-11-26 DIAGNOSIS — Z85038 Personal history of other malignant neoplasm of large intestine: Secondary | ICD-10-CM | POA: Insufficient documentation

## 2019-11-26 DIAGNOSIS — I2510 Atherosclerotic heart disease of native coronary artery without angina pectoris: Secondary | ICD-10-CM | POA: Insufficient documentation

## 2019-11-26 DIAGNOSIS — Z7952 Long term (current) use of systemic steroids: Secondary | ICD-10-CM | POA: Diagnosis not present

## 2019-11-26 DIAGNOSIS — E119 Type 2 diabetes mellitus without complications: Secondary | ICD-10-CM | POA: Insufficient documentation

## 2019-11-26 DIAGNOSIS — Z79899 Other long term (current) drug therapy: Secondary | ICD-10-CM | POA: Diagnosis not present

## 2019-11-26 DIAGNOSIS — R718 Other abnormality of red blood cells: Secondary | ICD-10-CM | POA: Insufficient documentation

## 2019-11-26 DIAGNOSIS — I10 Essential (primary) hypertension: Secondary | ICD-10-CM | POA: Diagnosis not present

## 2019-11-26 DIAGNOSIS — C189 Malignant neoplasm of colon, unspecified: Secondary | ICD-10-CM

## 2019-11-26 DIAGNOSIS — Z7982 Long term (current) use of aspirin: Secondary | ICD-10-CM | POA: Diagnosis not present

## 2019-11-26 DIAGNOSIS — M069 Rheumatoid arthritis, unspecified: Secondary | ICD-10-CM | POA: Insufficient documentation

## 2019-11-26 LAB — CBC WITH DIFFERENTIAL/PLATELET
Abs Immature Granulocytes: 0.04 10*3/uL (ref 0.00–0.07)
Basophils Absolute: 0.1 10*3/uL (ref 0.0–0.1)
Basophils Relative: 1 %
Eosinophils Absolute: 0.1 10*3/uL (ref 0.0–0.5)
Eosinophils Relative: 1 %
HCT: 36.8 % (ref 36.0–46.0)
Hemoglobin: 12.4 g/dL (ref 12.0–15.0)
Immature Granulocytes: 0 %
Lymphocytes Relative: 27 %
Lymphs Abs: 2.8 10*3/uL (ref 0.7–4.0)
MCH: 26.6 pg (ref 26.0–34.0)
MCHC: 33.7 g/dL (ref 30.0–36.0)
MCV: 79 fL — ABNORMAL LOW (ref 80.0–100.0)
Monocytes Absolute: 0.7 10*3/uL (ref 0.1–1.0)
Monocytes Relative: 7 %
Neutro Abs: 6.8 10*3/uL (ref 1.7–7.7)
Neutrophils Relative %: 64 %
Platelets: 295 10*3/uL (ref 150–400)
RBC: 4.66 MIL/uL (ref 3.87–5.11)
RDW: 13.2 % (ref 11.5–15.5)
WBC: 10.6 10*3/uL — ABNORMAL HIGH (ref 4.0–10.5)
nRBC: 0 % (ref 0.0–0.2)

## 2019-11-26 LAB — COMPREHENSIVE METABOLIC PANEL
ALT: 15 U/L (ref 0–44)
AST: 15 U/L (ref 15–41)
Albumin: 3.6 g/dL (ref 3.5–5.0)
Alkaline Phosphatase: 53 U/L (ref 38–126)
Anion gap: 9 (ref 5–15)
BUN: 19 mg/dL (ref 8–23)
CO2: 28 mmol/L (ref 22–32)
Calcium: 8.5 mg/dL — ABNORMAL LOW (ref 8.9–10.3)
Chloride: 104 mmol/L (ref 98–111)
Creatinine, Ser: 0.81 mg/dL (ref 0.44–1.00)
GFR calc Af Amer: >60 mL/min (ref >60)
GFR calc non Af Amer: >60 mL/min (ref >60)
Glucose, Bld: 154 mg/dL — ABNORMAL HIGH (ref 70–99)
Potassium: 3.5 mmol/L (ref 3.5–5.1)
Sodium: 141 mmol/L (ref 135–145)
Total Bilirubin: 1 mg/dL (ref 0.3–1.2)
Total Protein: 6.9 g/dL (ref 6.5–8.1)

## 2019-11-27 ENCOUNTER — Other Ambulatory Visit (HOSPITAL_COMMUNITY): Payer: Medicare Other

## 2019-11-27 LAB — CEA: CEA: 3 ng/mL (ref 0.0–4.7)

## 2019-11-29 DIAGNOSIS — M0589 Other rheumatoid arthritis with rheumatoid factor of multiple sites: Secondary | ICD-10-CM | POA: Diagnosis not present

## 2019-11-29 DIAGNOSIS — Z79899 Other long term (current) drug therapy: Secondary | ICD-10-CM | POA: Diagnosis not present

## 2019-12-04 ENCOUNTER — Inpatient Hospital Stay (HOSPITAL_BASED_OUTPATIENT_CLINIC_OR_DEPARTMENT_OTHER): Payer: Medicare Other | Admitting: Nurse Practitioner

## 2019-12-04 ENCOUNTER — Ambulatory Visit (HOSPITAL_COMMUNITY): Payer: Medicare Other | Admitting: Nurse Practitioner

## 2019-12-04 ENCOUNTER — Other Ambulatory Visit: Payer: Self-pay

## 2019-12-04 DIAGNOSIS — I2510 Atherosclerotic heart disease of native coronary artery without angina pectoris: Secondary | ICD-10-CM | POA: Diagnosis not present

## 2019-12-04 DIAGNOSIS — I10 Essential (primary) hypertension: Secondary | ICD-10-CM | POA: Diagnosis not present

## 2019-12-04 DIAGNOSIS — R718 Other abnormality of red blood cells: Secondary | ICD-10-CM | POA: Diagnosis not present

## 2019-12-04 DIAGNOSIS — C189 Malignant neoplasm of colon, unspecified: Secondary | ICD-10-CM | POA: Diagnosis not present

## 2019-12-04 DIAGNOSIS — Z86718 Personal history of other venous thrombosis and embolism: Secondary | ICD-10-CM | POA: Diagnosis not present

## 2019-12-04 DIAGNOSIS — Z85038 Personal history of other malignant neoplasm of large intestine: Secondary | ICD-10-CM | POA: Diagnosis not present

## 2019-12-04 DIAGNOSIS — E119 Type 2 diabetes mellitus without complications: Secondary | ICD-10-CM | POA: Diagnosis not present
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
## 2019-12-04 NOTE — Assessment & Plan Note (Addendum)
1.  Stage III colon cancer: -Diagnosed in November 2001, with 2/4 lymph nodes positive with lymphovascular invasion. -She was treated adjuvantly with 8 cycles of FOLFIRI. -Last colonoscopy was in late 2015 in [place]. -She denies any bleeding per rectum or melena.  She denies any stool changes. -CTAP renal study on 03/17/2018 did not show any evidence of metastatic disease. -Blood work and physical exam were WNL.  CEA 3.0 -She will follow-up in 1 year with repeat labs.  2.  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

## 2019-12-04 NOTE — Patient Instructions (Signed)
[place] at [facility] Discharge Instructions  Follow up in 1 year with labs    Thank you for choosing [place] at [facility] to provide your oncology and hematology care.  To afford each patient quality time with our provider, please arrive at least 15 minutes before your scheduled appointment time.   If you have a lab appointment with the [facility] please come in thru the Main Entrance and check in at the main information desk.  You need to re-schedule your appointment should you arrive 10 or more minutes late.  We strive to give you quality time with our providers, and arriving late affects you and other patients whose appointments are after yours.  Also, if you no show three or more times for appointments you may be dismissed from the clinic at the providers discretion.     Again, thank you for choosing [facility].  Our hope is that these requests will decrease the amount of time that you wait before being seen by our physicians.       _____________________________________________________________  Should you have questions after your visit to [facility], please contact our office at [phone] and follow the prompts.  Our office hours are 8:00 a.m. and 4:30 p.m. Monday - Friday.  Please note that voicemails left after 4:00 p.m. may not be returned until the following business day.  We are closed weekends and major holidays.  You do have access to a nurse 24-7, just call the main number to the clinic [phone] and do not press any options, hold on the line and a nurse will answer the phone.    For prescription refill requests, have your pharmacy contact our office and allow 72 hours.    Due to Covid, you will need to wear a mask upon entering the hospital. If you do not have a mask, a mask will be given to you at the Main Entrance upon arrival. For doctor visits, patients may have 1 support person age 81  or older with them. For treatment visits, patients can not have anyone with them due to social distancing guidelines and our immunocompromised population.

## 2019-12-13 ENCOUNTER — Other Ambulatory Visit: Payer: Self-pay | Admitting: Cardiology

## 2019-12-13 DIAGNOSIS — R609 Edema, unspecified: Secondary | ICD-10-CM

## 2019-12-27 DIAGNOSIS — M0589 Other rheumatoid arthritis with rheumatoid factor of multiple sites: Secondary | ICD-10-CM | POA: Diagnosis not present

## 2019-12-28 ENCOUNTER — Other Ambulatory Visit: Payer: Self-pay | Admitting: Cardiology

## 2020-01-01 DIAGNOSIS — E876 Hypokalemia: Secondary | ICD-10-CM | POA: Diagnosis not present

## 2020-01-01 DIAGNOSIS — M130 Polyarthritis, unspecified: Secondary | ICD-10-CM | POA: Diagnosis not present

## 2020-01-01 DIAGNOSIS — I10 Essential (primary) hypertension: Secondary | ICD-10-CM | POA: Diagnosis not present

## 2020-01-01 DIAGNOSIS — R260 Ataxic gait: Secondary | ICD-10-CM | POA: Diagnosis not present

## 2020-01-01 DIAGNOSIS — R569 Unspecified convulsions: Secondary | ICD-10-CM | POA: Diagnosis not present

## 2020-01-08 DIAGNOSIS — M0589 Other rheumatoid arthritis with rheumatoid factor of multiple sites: Secondary | ICD-10-CM | POA: Diagnosis not present

## 2020-01-22 DIAGNOSIS — I10 Essential (primary) hypertension: Secondary | ICD-10-CM | POA: Diagnosis not present

## 2020-01-22 DIAGNOSIS — E1169 Type 2 diabetes mellitus with other specified complication: Secondary | ICD-10-CM | POA: Diagnosis not present

## 2020-01-22 DIAGNOSIS — Z6837 Body mass index (BMI) 37.0-37.9, adult: Secondary | ICD-10-CM | POA: Diagnosis not present

## 2020-01-22 DIAGNOSIS — R569 Unspecified convulsions: Secondary | ICD-10-CM | POA: Diagnosis not present

## 2020-01-24 DIAGNOSIS — M0589 Other rheumatoid arthritis with rheumatoid factor of multiple sites: Secondary | ICD-10-CM | POA: Diagnosis not present

## 2020-01-28 ENCOUNTER — Other Ambulatory Visit (HOSPITAL_COMMUNITY): Payer: Self-pay | Admitting: Family Medicine

## 2020-01-28 DIAGNOSIS — Z1231 Encounter for screening mammogram for malignant neoplasm of breast: Secondary | ICD-10-CM

## 2020-02-05 ENCOUNTER — Other Ambulatory Visit: Payer: Self-pay | Admitting: Cardiology

## 2020-02-06 ENCOUNTER — Telehealth: Payer: Self-pay

## 2020-02-06 NOTE — Telephone Encounter (Signed)
Telephone encounter:  Reason for call: Simvastatin is irritating her vagina. Burning while she was urinating She stopped the med and the irritation went away. Usual provider: MP  Last office visit:10/18/19  Next office visit: 04/17/20   Last hospitalization: NA   Current Outpatient Medications on File Prior to Visit  Medication Sig Dispense Refill  . acetaminophen (TYLENOL) 500 MG tablet Take 1,000 mg by mouth every 6 (six) hours as needed for moderate pain or headache. (Patient not taking: Reported on 12/04/2019)    . amLODipine (NORVASC) 5 MG tablet Take 5 mg by mouth 2 (two) times daily.     Marland Kitchen aspirin EC 81 MG tablet Take 81 mg by mouth daily.    . cholecalciferol (VITAMIN D) 25 MCG (1000 UT) tablet Take 2,000 Units by mouth daily.     . cyclobenzaprine (FLEXERIL) 10 MG tablet Take 10 mg by mouth 3 (three) times daily as needed for muscle spasms. (Patient not taking: Reported on 12/04/2019)    . empagliflozin (JARDIANCE) 10 MG TABS tablet Take 1 tablet (10 mg total) by mouth daily before breakfast. 90 tablet 2  . folic acid (FOLVITE) 1 MG tablet Take 1 mg by mouth daily.    . hydrochlorothiazide (HYDRODIURIL) 25 MG tablet Take 25 mg by mouth daily.     . isosorbide mononitrate (IMDUR) 60 MG 24 hr tablet Take 1 tablet by mouth once daily 90 tablet 0  . levETIRAcetam (KEPPRA) 500 MG tablet Take 500 mg by mouth 2 (two) times daily.    . metFORMIN (GLUCOPHAGE) 500 MG tablet Take 500 mg by mouth 2 (two) times daily with a meal.    . metoprolol tartrate (LOPRESSOR) 50 MG tablet Take 1 tablet by mouth twice daily 180 tablet 0  . nitroGLYCERIN (NITROSTAT) 0.4 MG SL tablet Place 0.4 mg under the tongue every 5 (five) minutes x 3 doses as needed.  (Patient not taking: Reported on 12/04/2019)    . pantoprazole (PROTONIX) 40 MG tablet Take 1 tablet by mouth once daily 90 tablet 0  . potassium chloride (KLOR-CON) 10 MEQ tablet Take 1 tablet by mouth 2 (two) times a week.    . predniSONE (DELTASONE) 5  MG tablet Take 5 mg by mouth daily.     . simvastatin (ZOCOR) 20 MG tablet Take 1 tablet (20 mg total) by mouth at bedtime. 90 tablet 3   No current facility-administered medications on file prior to visit.

## 2020-02-06 NOTE — Telephone Encounter (Signed)
done

## 2020-02-13 ENCOUNTER — Ambulatory Visit (HOSPITAL_COMMUNITY)
Admission: RE | Admit: 2020-02-13 | Discharge: 2020-02-13 | Disposition: A | Discharge status: Home / Self Care | Payer: Medicare Other | Source: Ambulatory Visit | Attending: Family Medicine | Admitting: Family Medicine

## 2020-02-13 DIAGNOSIS — Z1231 Encounter for screening mammogram for malignant neoplasm of breast: Secondary | ICD-10-CM

## 2020-02-14 DIAGNOSIS — M06341 Rheumatoid nodule, right hand: Secondary | ICD-10-CM | POA: Diagnosis not present

## 2020-02-14 DIAGNOSIS — Z6835 Body mass index (BMI) 35.0-35.9, adult: Secondary | ICD-10-CM | POA: Diagnosis not present

## 2020-02-14 DIAGNOSIS — E669 Obesity, unspecified: Secondary | ICD-10-CM | POA: Diagnosis not present

## 2020-02-14 DIAGNOSIS — M0589 Other rheumatoid arthritis with rheumatoid factor of multiple sites: Secondary | ICD-10-CM | POA: Diagnosis not present

## 2020-02-14 DIAGNOSIS — M150 Primary generalized (osteo)arthritis: Secondary | ICD-10-CM | POA: Diagnosis not present

## 2020-02-14 DIAGNOSIS — M5136 Other intervertebral disc degeneration, lumbar region: Secondary | ICD-10-CM | POA: Diagnosis not present

## 2020-02-14 DIAGNOSIS — M25562 Pain in left knee: Secondary | ICD-10-CM | POA: Diagnosis not present

## 2020-02-19 ENCOUNTER — Other Ambulatory Visit: Payer: Self-pay

## 2020-02-19 MED ORDER — ROSUVASTATIN CALCIUM 5 MG PO TABS
5.0000 mg | ORAL_TABLET | Freq: Every day | ORAL | 6 refills | Status: DC
Start: 2020-02-19 — End: 2020-11-01

## 2020-02-19 NOTE — Telephone Encounter (Signed)
This was addressed, but probably in chat message instead of tel encounter.   I do not think simvastatin causes vaginal itching. She may have had an infection. That said, if it stopped after stopping simvastatin, okay to discontinue. If she is willing to try crestor, please send 5 mg 30 pillsX3 refills.  Thanks MJP

## 2020-02-19 NOTE — Telephone Encounter (Signed)
Telephone encounter: 2nd attempt , pt called 02/06/19 also   Reason for call: Simvastatin is irritating her vagina. Burning while she was urinating She stopped the med and the irritation went away.do you want her to try something else ? Usual provider: MP  Last office visit:10/18/19  Next office visit: 04/17/20   Last hospitalization: NA   Current Outpatient Medications on File Prior to Visit  Medication Sig Dispense Refill  . acetaminophen (TYLENOL) 500 MG tablet Take 1,000 mg by mouth every 6 (six) hours as needed for moderate pain or headache. (Patient not taking: Reported on 12/04/2019)    . amLODipine (NORVASC) 5 MG tablet Take 5 mg by mouth 2 (two) times daily.     Marland Kitchen aspirin EC 81 MG tablet Take 81 mg by mouth daily.    . cholecalciferol (VITAMIN D) 25 MCG (1000 UT) tablet Take 2,000 Units by mouth daily.     . cyclobenzaprine (FLEXERIL) 10 MG tablet Take 10 mg by mouth 3 (three) times daily as needed for muscle spasms. (Patient not taking: Reported on 12/04/2019)    . empagliflozin (JARDIANCE) 10 MG TABS tablet Take 1 tablet (10 mg total) by mouth daily before breakfast. 90 tablet 2  . folic acid (FOLVITE) 1 MG tablet Take 1 mg by mouth daily.    . hydrochlorothiazide (HYDRODIURIL) 25 MG tablet Take 25 mg by mouth daily.     . isosorbide mononitrate (IMDUR) 60 MG 24 hr tablet Take 1 tablet by mouth once daily 90 tablet 0  . levETIRAcetam (KEPPRA) 500 MG tablet Take 500 mg by mouth 2 (two) times daily.    . metFORMIN (GLUCOPHAGE) 500 MG tablet Take 500 mg by mouth 2 (two) times daily with a meal.    . metoprolol tartrate (LOPRESSOR) 50 MG tablet Take 1 tablet by mouth twice daily 180 tablet 0  . metoprolol tartrate (LOPRESSOR) 50 MG tablet Take 1 tablet by mouth twice daily 180 tablet 0  . nitroGLYCERIN (NITROSTAT) 0.4 MG SL tablet Place 0.4 mg under the tongue every 5 (five) minutes x 3 doses as needed.  (Patient not taking: Reported on 12/04/2019)    . pantoprazole (PROTONIX) 40 MG  tablet Take 1 tablet by mouth once daily 90 tablet 0  . potassium chloride (KLOR-CON) 10 MEQ tablet Take 1 tablet by mouth 2 (two) times a week.    . predniSONE (DELTASONE) 5 MG tablet Take 5 mg by mouth daily.     . simvastatin (ZOCOR) 20 MG tablet Take 1 tablet (20 mg total) by mouth at bedtime. 90 tablet 3   No current facility-administered medications on file prior to visit.

## 2020-02-21 DIAGNOSIS — M0589 Other rheumatoid arthritis with rheumatoid factor of multiple sites: Secondary | ICD-10-CM | POA: Diagnosis not present

## 2020-02-27 DIAGNOSIS — Z23 Encounter for immunization: Secondary | ICD-10-CM | POA: Diagnosis not present

## 2020-03-15 ENCOUNTER — Other Ambulatory Visit: Payer: Self-pay | Admitting: Cardiology

## 2020-03-20 DIAGNOSIS — M0589 Other rheumatoid arthritis with rheumatoid factor of multiple sites: Secondary | ICD-10-CM | POA: Diagnosis not present

## 2020-03-24 ENCOUNTER — Ambulatory Visit (HOSPITAL_COMMUNITY): Payer: Medicare Other | Attending: Internal Medicine | Admitting: Physical Therapy

## 2020-03-24 ENCOUNTER — Other Ambulatory Visit: Payer: Self-pay

## 2020-03-24 ENCOUNTER — Encounter (HOSPITAL_COMMUNITY): Payer: Self-pay | Admitting: Physical Therapy

## 2020-03-24 DIAGNOSIS — R29898 Other symptoms and signs involving the musculoskeletal system: Secondary | ICD-10-CM | POA: Insufficient documentation

## 2020-03-24 DIAGNOSIS — M6281 Muscle weakness (generalized): Secondary | ICD-10-CM | POA: Insufficient documentation

## 2020-03-24 DIAGNOSIS — R2689 Other abnormalities of gait and mobility: Secondary | ICD-10-CM | POA: Diagnosis not present

## 2020-03-24 DIAGNOSIS — M545 Low back pain, unspecified: Secondary | ICD-10-CM | POA: Insufficient documentation
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
## 2020-03-26 ENCOUNTER — Other Ambulatory Visit: Payer: Self-pay

## 2020-03-26 ENCOUNTER — Encounter (HOSPITAL_COMMUNITY): Payer: Self-pay

## 2020-03-26 ENCOUNTER — Ambulatory Visit (HOSPITAL_COMMUNITY): Payer: Medicare Other

## 2020-03-26 DIAGNOSIS — R29898 Other symptoms and signs involving the musculoskeletal system: Secondary | ICD-10-CM | POA: Diagnosis not present

## 2020-03-26 DIAGNOSIS — M545 Low back pain, unspecified: Secondary | ICD-10-CM | POA: Diagnosis not present

## 2020-03-26 DIAGNOSIS — M6281 Muscle weakness (generalized): Secondary | ICD-10-CM | POA: Diagnosis not present

## 2020-03-26 DIAGNOSIS — R2689 Other abnormalities of gait and mobility: Secondary | ICD-10-CM
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
## 2020-03-26 NOTE — Patient Instructions (Addendum)
Isometric Abdominal    Lying on back with knees bent, tighten stomach paired with exhalation. Hold 3 seconds. Do not hold breath during this exercise. Repeat 10 times per set. Do 2 sets per session. Do 4 sessions per week.  http://orth.exer.us/1087   Copyright  VHI. All rights reserved.   Bridge    Lie back, legs bent. Inhale, pressing hips up. Keeping ribs in, lengthen lower back. Exhale, rolling down along spine from top. Repeat 10 times. Do 2 sessions per day.  http://pm.exer.us/55   Copyright  VHI. All rights reserved.   Abduction: Clam (Eccentric) - Side-Lying    Lie on side with knees bent. Lift top knee, keeping feet together. Keep trunk steady. Slowly lower for 3-5 seconds.  10 reps per set, 1 sets per day, 4 days per week.  http://ecce.exer.us/65   Copyright  VHI. All rights reserved.    Functional Quadriceps: Sit to Stand    Sit on edge of chair, feet flat on floor. Stand upright, extending knees fully. Repeat 5 times per set. Do 2 sets per session. Do 4 sessions per week.  http://orth.exer.us/735   Copyright  VHI. All rights reserved.

## 2020-04-08 ENCOUNTER — Other Ambulatory Visit: Payer: Self-pay

## 2020-04-08 ENCOUNTER — Ambulatory Visit (HOSPITAL_COMMUNITY): Payer: Medicare Other | Admitting: Physical Therapy

## 2020-04-08 ENCOUNTER — Encounter (HOSPITAL_COMMUNITY): Payer: Self-pay | Admitting: Physical Therapy

## 2020-04-08 DIAGNOSIS — R2689 Other abnormalities of gait and mobility: Secondary | ICD-10-CM | POA: Diagnosis not present

## 2020-04-08 DIAGNOSIS — M545 Low back pain, unspecified: Secondary | ICD-10-CM | POA: Diagnosis not present

## 2020-04-08 DIAGNOSIS — M6281 Muscle weakness (generalized): Secondary | ICD-10-CM | POA: Diagnosis not present

## 2020-04-08 DIAGNOSIS — R29898 Other symptoms and signs involving the musculoskeletal system: Secondary | ICD-10-CM | POA: Diagnosis not present
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
## 2020-04-08 NOTE — Patient Instructions (Signed)
Access Code: [email or web address] URL: [email or web address] Date: 04/08/2020 Prepared by: Mitzi Hansen Caddie Randle  Exercises Standing Hip Abduction with Counter Support - 1 x daily - 7 x weekly - 2 sets - 10 reps Standing Hip Extension with Counter Support - 1 x daily - 7 x weekly - 2 sets - 10 reps

## 2020-04-10 ENCOUNTER — Ambulatory Visit (HOSPITAL_COMMUNITY): Payer: Medicare Other | Attending: Internal Medicine

## 2020-04-10 ENCOUNTER — Encounter (HOSPITAL_COMMUNITY): Payer: Self-pay

## 2020-04-10 ENCOUNTER — Other Ambulatory Visit: Payer: Self-pay

## 2020-04-10 VITALS — BP 134/58 | HR 60

## 2020-04-10 DIAGNOSIS — M545 Low back pain, unspecified: Secondary | ICD-10-CM

## 2020-04-10 DIAGNOSIS — R2689 Other abnormalities of gait and mobility: Secondary | ICD-10-CM | POA: Diagnosis not present

## 2020-04-10 DIAGNOSIS — M6281 Muscle weakness (generalized): Secondary | ICD-10-CM

## 2020-04-10 DIAGNOSIS — R29898 Other symptoms and signs involving the musculoskeletal system: Secondary | ICD-10-CM | POA: Diagnosis not present
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
## 2020-04-14 ENCOUNTER — Ambulatory Visit (HOSPITAL_COMMUNITY): Payer: Medicare Other | Admitting: Physical Therapy

## 2020-04-14 ENCOUNTER — Telehealth (HOSPITAL_COMMUNITY): Payer: Self-pay | Admitting: Physical Therapy

## 2020-04-14 NOTE — Telephone Encounter (Signed)
Patient no show, spoke with patient who said she forgot about appointment after several recent deaths in the family. She stated she will be here for next appointment on Wednesday.  3:06 PM, 04/14/20 Mearl Latin PT, DPT Physical Therapist at [facility]

## 2020-04-16 ENCOUNTER — Other Ambulatory Visit: Payer: Self-pay

## 2020-04-16 ENCOUNTER — Ambulatory Visit (HOSPITAL_COMMUNITY): Payer: Medicare Other | Admitting: Physical Therapy

## 2020-04-16 DIAGNOSIS — R2689 Other abnormalities of gait and mobility: Secondary | ICD-10-CM

## 2020-04-16 DIAGNOSIS — R29898 Other symptoms and signs involving the musculoskeletal system: Secondary | ICD-10-CM | POA: Diagnosis not present

## 2020-04-16 DIAGNOSIS — M6281 Muscle weakness (generalized): Secondary | ICD-10-CM | POA: Diagnosis not present

## 2020-04-16 DIAGNOSIS — M545 Low back pain, unspecified: Secondary | ICD-10-CM
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
## 2020-04-17 ENCOUNTER — Encounter: Payer: Self-pay | Admitting: Cardiology

## 2020-04-17 ENCOUNTER — Ambulatory Visit: Payer: Medicare Other | Admitting: Cardiology

## 2020-04-17 VITALS — BP 148/66 | HR 57 | Resp 16 | Ht 65.0 in | Wt 217.0 lb

## 2020-04-17 DIAGNOSIS — I25118 Atherosclerotic heart disease of native coronary artery with other forms of angina pectoris: Secondary | ICD-10-CM

## 2020-04-17 DIAGNOSIS — I10 Essential (primary) hypertension: Secondary | ICD-10-CM | POA: Diagnosis not present

## 2020-04-17 DIAGNOSIS — M0589 Other rheumatoid arthritis with rheumatoid factor of multiple sites: Secondary | ICD-10-CM | POA: Diagnosis not present

## 2020-04-17 DIAGNOSIS — I25119 Atherosclerotic heart disease of native coronary artery with unspecified angina pectoris: Secondary | ICD-10-CM | POA: Diagnosis not present

## 2020-04-17 DIAGNOSIS — E1165 Type 2 diabetes mellitus with hyperglycemia: Secondary | ICD-10-CM | POA: Diagnosis not present

## 2020-04-17 MED ORDER — SPIRONOLACTONE 25 MG PO TABS: 25.0000 mg | ORAL_TABLET | Freq: Every day | ORAL | 3 refills | Status: DC

## 2020-04-17 NOTE — Progress Notes (Signed)
Follow up visit  Subjective:   Melissa Reynolds, female    DOB: 25-Jan-1941, 79 y.o.   MRN: [id number]   Chief complaint: Chest pain  79 year old [place] American female with hypertension, type II diabetes mellitus, moderate CAD, history of colon cancer status post surgery in 2001 followed by chemotherapy, currently in remission, rheumatoid arthritis, on Keppra for seizure disorder.  Patient is here with her daughter. She has recently had bilateral leg edema towards end of the day, exertional dyspnea, and orthopnea. Blood pressure is elevated today.   Current Outpatient Medications on File Prior to Visit  Medication Sig Dispense Refill  . acetaminophen (TYLENOL) 500 MG tablet Take 1,000 mg by mouth every 6 (six) hours as needed for moderate pain or headache.    Marland Kitchen amLODipine (NORVASC) 5 MG tablet Take 5 mg by mouth 2 (two) times daily.     Marland Kitchen aspirin EC 81 MG tablet Take 81 mg by mouth daily.    . cholecalciferol (VITAMIN D) 25 MCG (1000 UT) tablet Take 2,000 Units by mouth daily.     . cyclobenzaprine (FLEXERIL) 10 MG tablet Take 10 mg by mouth 3 (three) times daily as needed for muscle spasms.    . folic acid (FOLVITE) 1 MG tablet Take 1 mg by mouth daily.    . hydrochlorothiazide (HYDRODIURIL) 25 MG tablet Take 25 mg by mouth daily.     . isosorbide mononitrate (IMDUR) 60 MG 24 hr tablet Take 1 tablet by mouth once daily 90 tablet 0  . levETIRAcetam (KEPPRA) 500 MG tablet Take 500 mg by mouth 2 (two) times daily.    . metFORMIN (GLUCOPHAGE) 500 MG tablet Take 500 mg by mouth 2 (two) times daily with a meal.    . metoprolol tartrate (LOPRESSOR) 50 MG tablet Take 1 tablet by mouth twice daily 180 tablet 0  . nitroGLYCERIN (NITROSTAT) 0.4 MG SL tablet Place 0.4 mg under the tongue every 5 (five) minutes x 3 doses as needed.    . pantoprazole (PROTONIX) 40 MG tablet Take 1 tablet by mouth once daily 90 tablet 0  . potassium chloride (KLOR-CON) 10 MEQ tablet Take 1 tablet by mouth 2 (two)  times a week.    . predniSONE (DELTASONE) 5 MG tablet Take 5 mg by mouth daily.     . rosuvastatin (CRESTOR) 5 MG tablet Take 1 tablet (5 mg total) by mouth daily. 30 tablet 6  . empagliflozin (JARDIANCE) 10 MG TABS tablet Take 1 tablet (10 mg total) by mouth daily before breakfast. (Patient not taking: Reported on 04/17/2020) 90 tablet 2   No current facility-administered medications on file prior to visit.    Cardiovascular studies:  EKG 04/17/2020: Sinus rhythm 61 bpnm Low voltage in precordial leads Possible old anterior and inferior infarct  Coronary angiography 10/24/2018: Extremely tortuous vessels with distal LAD focal 60% stenosis, unchanged compared to prior. TIMI II flow seen in all vessels that improved with IC NTG Her chest pain is likely due to microvascular dysfunction, rather than the stable moderate distal LAD lesion.  Echocardiogram 09/13/2018: Left ventricle cavity is normal in size. Mild concentric hypertrophy of the left ventricle. Normal global wall motion. Doppler evidence of grade I (impaired) diastolic dysfunction, normal LAP. Calculated EF 63%.  Mild (Grade I) mitral regurgitation.  Mild tricuspid regurgitation. Estimated pulmonary artery systolic pressure 22 mmHg.  Mild pulmonic regurgitation. No significant change compared to previous study on 04/05/2017.   EKG 03/29/2019: Sinus rhythm 76 bpm. Poor R wave progression. Otherwise  normal EKG   Recent labs: 11/26/2019: Glucose 154, BUN/Cr 19/0.81. EGFR >60. Na/K 141/3.5. Rest of the CMP normal H/H 12/36. MCV 79. Platelets 295 TSH 4.2 normal   09/24/2019: Glucose 188, BUN/Cr 18/0.9. eGFR 58. HbA1C 8.1%  04/2019: TSH 4.1   11/27/2018: Glucose 195. BUN/Cr 14/0.69. eGFR >60. Na/K 139/3.5. Rest of the CMP normal.  H/H 12/35. MCV 78. Platelets 297.   08/01/2018: Chol 150, TG 143, HDL 63, LDL 64  Review of Systems  Cardiovascular: Negative for chest pain, dyspnea on exertion, leg swelling, palpitations  and syncope.       Today's Vitals   04/17/20 1037  BP: (!) 148/66  Pulse: (!) 57  Resp: 16  SpO2: 96%  Weight: 217 lb (98.4 kg)  Height: 5' 5"  (1.651 m)   Body mass index is 36.11 kg/m.   Objective:  Physical Exam Vitals and nursing note reviewed.  Constitutional:      General: She is not in acute distress. Neck:     Vascular: No JVD.  Cardiovascular:     Rate and Rhythm: Normal rate and regular rhythm.     Pulses: Intact distal pulses.     Heart sounds: Normal heart sounds. No murmur heard.   Pulmonary:     Effort: Pulmonary effort is normal.     Breath sounds: Normal breath sounds. No wheezing or rales.  Musculoskeletal:     Right lower leg: No edema.     Left lower leg: No edema.         Assessment & Recommendations:    79 year old African American female with hypertension, type II diabetes mellitus, nonobstructive CAD, history of colon cancer status post surgery in 2001 followed by chemotherapy, currently in remission, rheumatoid arthritis, on Keppra for seizure disorder  CAD without angina: No angina symptoms. Continue Aspirin 81 mg, simvastatin 20 mg  Hypertension: Uncontrolled. Likely reason for her dyspnea, edema symptoms. Started spironolactone 25 mg daily. Check BMP in 1 week. Arranged for remote patient monitoring through pur pharmacist Manuela Schwartz.  DM: Managed by PCP.  F/u in 4-6 weeks   Bessie Boyte Esther Hardy, MD [facility] Pager: [phone] Office: [phone] If no answer Cell [phone]

## 2020-04-21 ENCOUNTER — Ambulatory Visit (HOSPITAL_COMMUNITY): Payer: Medicare Other | Admitting: Physical Therapy

## 2020-04-22 ENCOUNTER — Ambulatory Visit (HOSPITAL_COMMUNITY): Payer: Medicare Other | Admitting: Physical Therapy

## 2020-04-22 DIAGNOSIS — I10 Essential (primary) hypertension: Secondary | ICD-10-CM | POA: Diagnosis not present

## 2020-04-22 DIAGNOSIS — E1169 Type 2 diabetes mellitus with other specified complication: Secondary | ICD-10-CM | POA: Diagnosis not present

## 2020-04-24 ENCOUNTER — Ambulatory Visit (HOSPITAL_COMMUNITY): Payer: Medicare Other | Admitting: Physical Therapy

## 2020-04-24 ENCOUNTER — Other Ambulatory Visit: Payer: Self-pay

## 2020-04-24 DIAGNOSIS — M545 Low back pain, unspecified: Secondary | ICD-10-CM

## 2020-04-24 DIAGNOSIS — R2689 Other abnormalities of gait and mobility: Secondary | ICD-10-CM

## 2020-04-24 DIAGNOSIS — M6281 Muscle weakness (generalized): Secondary | ICD-10-CM

## 2020-04-24 DIAGNOSIS — R29898 Other symptoms and signs involving the musculoskeletal system: Secondary | ICD-10-CM | POA: Diagnosis not present
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
## 2020-04-28 ENCOUNTER — Other Ambulatory Visit: Payer: Self-pay

## 2020-04-28 ENCOUNTER — Encounter (HOSPITAL_COMMUNITY): Payer: Self-pay | Admitting: Physical Therapy

## 2020-04-28 ENCOUNTER — Ambulatory Visit (HOSPITAL_COMMUNITY): Payer: Medicare Other | Admitting: Physical Therapy

## 2020-04-28 DIAGNOSIS — M545 Low back pain, unspecified: Secondary | ICD-10-CM | POA: Diagnosis not present

## 2020-04-28 DIAGNOSIS — R29898 Other symptoms and signs involving the musculoskeletal system: Secondary | ICD-10-CM

## 2020-04-28 DIAGNOSIS — R2689 Other abnormalities of gait and mobility: Secondary | ICD-10-CM | POA: Diagnosis not present

## 2020-04-28 DIAGNOSIS — M6281 Muscle weakness (generalized): Secondary | ICD-10-CM
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
## 2020-04-29 DIAGNOSIS — E1169 Type 2 diabetes mellitus with other specified complication: Secondary | ICD-10-CM | POA: Diagnosis not present

## 2020-04-29 DIAGNOSIS — I10 Essential (primary) hypertension: Secondary | ICD-10-CM | POA: Diagnosis not present

## 2020-05-01 ENCOUNTER — Encounter (HOSPITAL_COMMUNITY): Payer: Self-pay | Admitting: Physical Therapy

## 2020-05-01 ENCOUNTER — Ambulatory Visit (HOSPITAL_COMMUNITY): Payer: Medicare Other | Admitting: Physical Therapy

## 2020-05-01 ENCOUNTER — Other Ambulatory Visit: Payer: Self-pay

## 2020-05-01 ENCOUNTER — Ambulatory Visit (HOSPITAL_COMMUNITY): Payer: Medicare Other

## 2020-05-01 DIAGNOSIS — M545 Low back pain, unspecified: Secondary | ICD-10-CM | POA: Diagnosis not present

## 2020-05-01 DIAGNOSIS — M6281 Muscle weakness (generalized): Secondary | ICD-10-CM | POA: Diagnosis not present

## 2020-05-01 DIAGNOSIS — R2689 Other abnormalities of gait and mobility: Secondary | ICD-10-CM | POA: Diagnosis not present

## 2020-05-01 DIAGNOSIS — R29898 Other symptoms and signs involving the musculoskeletal system: Secondary | ICD-10-CM
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
## 2020-05-05 ENCOUNTER — Other Ambulatory Visit: Payer: Self-pay | Admitting: Cardiology

## 2020-05-05 ENCOUNTER — Other Ambulatory Visit: Payer: Self-pay

## 2020-05-05 ENCOUNTER — Encounter (HOSPITAL_COMMUNITY): Payer: Self-pay | Admitting: Physical Therapy

## 2020-05-05 ENCOUNTER — Ambulatory Visit (HOSPITAL_COMMUNITY): Payer: Medicare Other | Admitting: Physical Therapy

## 2020-05-05 DIAGNOSIS — R2689 Other abnormalities of gait and mobility: Secondary | ICD-10-CM

## 2020-05-05 DIAGNOSIS — M545 Low back pain, unspecified: Secondary | ICD-10-CM | POA: Diagnosis not present

## 2020-05-05 DIAGNOSIS — R29898 Other symptoms and signs involving the musculoskeletal system: Secondary | ICD-10-CM | POA: Diagnosis not present

## 2020-05-05 DIAGNOSIS — M6281 Muscle weakness (generalized): Secondary | ICD-10-CM | POA: Diagnosis not present
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
## 2020-05-07 ENCOUNTER — Ambulatory Visit (HOSPITAL_COMMUNITY): Payer: Medicare Other

## 2020-05-09 DIAGNOSIS — I10 Essential (primary) hypertension: Secondary | ICD-10-CM | POA: Diagnosis not present

## 2020-05-12 ENCOUNTER — Ambulatory Visit (HOSPITAL_COMMUNITY): Payer: Medicare Other | Admitting: Physical Therapy

## 2020-05-15 ENCOUNTER — Encounter (HOSPITAL_COMMUNITY): Payer: Medicare Other

## 2020-05-20 DIAGNOSIS — Z85038 Personal history of other malignant neoplasm of large intestine: Secondary | ICD-10-CM | POA: Diagnosis not present

## 2020-05-20 DIAGNOSIS — R194 Change in bowel habit: Secondary | ICD-10-CM | POA: Diagnosis not present

## 2020-05-20 DIAGNOSIS — K5900 Constipation, unspecified: Secondary | ICD-10-CM | POA: Diagnosis not present

## 2020-05-22 ENCOUNTER — Other Ambulatory Visit: Payer: Self-pay

## 2020-05-22 ENCOUNTER — Encounter: Payer: Self-pay | Admitting: Cardiology

## 2020-05-22 ENCOUNTER — Ambulatory Visit: Payer: Medicare Other | Admitting: Cardiology

## 2020-05-22 VITALS — BP 101/48 | HR 62 | Temp 96.3°F | Resp 16 | Ht 65.0 in | Wt 211.0 lb

## 2020-05-22 DIAGNOSIS — I10 Essential (primary) hypertension: Secondary | ICD-10-CM

## 2020-05-22 DIAGNOSIS — E1165 Type 2 diabetes mellitus with hyperglycemia: Secondary | ICD-10-CM

## 2020-05-22 DIAGNOSIS — E119 Type 2 diabetes mellitus without complications: Secondary | ICD-10-CM | POA: Insufficient documentation

## 2020-05-22 DIAGNOSIS — I25118 Atherosclerotic heart disease of native coronary artery with other forms of angina pectoris: Secondary | ICD-10-CM

## 2020-05-22 NOTE — Progress Notes (Signed)
Follow up visit  Subjective:   Melissa Reynolds, female    DOB: January 20, 1941, 80 y.o.   MRN: [id number]   Chief complaint: Chest pain  80 year old [place] American female with hypertension, type II diabetes mellitus, moderate CAD, history of colon cancer status post surgery in 2001 followed by chemotherapy, currently in remission, rheumatoid arthritis, on Keppra for seizure disorder.  Patient is doing ell. Blood pressure has improved and dyspnea,leg edema have resolved sine starting spironolactone.   Current Outpatient Medications on File Prior to Visit  Medication Sig Dispense Refill  . acetaminophen (TYLENOL) 500 MG tablet Take 1,000 mg by mouth every 6 (six) hours as needed for moderate pain or headache.    Marland Kitchen amLODipine (NORVASC) 5 MG tablet Take 5 mg by mouth 2 (two) times daily.     Marland Kitchen aspirin EC 81 MG tablet Take 81 mg by mouth daily.    . cholecalciferol (VITAMIN D) 25 MCG (1000 UT) tablet Take 2,000 Units by mouth daily.     . cyclobenzaprine (FLEXERIL) 10 MG tablet Take 10 mg by mouth 3 (three) times daily as needed for muscle spasms.    . empagliflozin (JARDIANCE) 10 MG TABS tablet Take 1 tablet (10 mg total) by mouth daily before breakfast. (Patient not taking: Reported on 04/17/2020) 90 tablet 2  . folic acid (FOLVITE) 1 MG tablet Take 1 mg by mouth daily.    . hydrochlorothiazide (HYDRODIURIL) 25 MG tablet Take 25 mg by mouth daily.     . isosorbide mononitrate (IMDUR) 60 MG 24 hr tablet Take 1 tablet by mouth once daily 90 tablet 0  . levETIRAcetam (KEPPRA) 500 MG tablet Take 500 mg by mouth 2 (two) times daily.    . metFORMIN (GLUCOPHAGE) 500 MG tablet Take 500 mg by mouth 2 (two) times daily with a meal.    . metoprolol tartrate (LOPRESSOR) 50 MG tablet Take 1 tablet by mouth twice daily 180 tablet 0  . nitroGLYCERIN (NITROSTAT) 0.4 MG SL tablet Place 0.4 mg under the tongue every 5 (five) minutes x 3 doses as needed.    . pantoprazole (PROTONIX) 40 MG tablet Take 1 tablet  by mouth once daily 90 tablet 0  . predniSONE (DELTASONE) 5 MG tablet Take 5 mg by mouth daily.     . rosuvastatin (CRESTOR) 5 MG tablet Take 1 tablet (5 mg total) by mouth daily. 30 tablet 6  . spironolactone (ALDACTONE) 25 MG tablet Take 1 tablet (25 mg total) by mouth daily. 60 tablet 3   No current facility-administered medications on file prior to visit.    Cardiovascular studies:  EKG 04/17/2020: Sinus rhythm 61 bpnm Low voltage in precordial leads Possible old anterior and inferior infarct  Coronary angiography 10/24/2018: Extremely tortuous vessels with distal LAD focal 60% stenosis, unchanged compared to prior. TIMI II flow seen in all vessels that improved with IC NTG Her chest pain is likely due to microvascular dysfunction, rather than the stable moderate distal LAD lesion.  Echocardiogram 09/13/2018: Left ventricle cavity is normal in size. Mild concentric hypertrophy of the left ventricle. Normal global wall motion. Doppler evidence of grade I (impaired) diastolic dysfunction, normal LAP. Calculated EF 63%.  Mild (Grade I) mitral regurgitation.  Mild tricuspid regurgitation. Estimated pulmonary artery systolic pressure 22 mmHg.  Mild pulmonic regurgitation. No significant change compared to previous study on 04/05/2017.   EKG 03/29/2019: Sinus rhythm 76 bpm. Poor R wave progression. Otherwise normal EKG   Recent labs: 11/26/2019: Glucose 154, BUN/Cr 19/0.81. EGFR >  60. Na/K 141/3.5. Rest of the CMP normal H/H 12/36. MCV 79. Platelets 295 TSH 4.2 normal   09/24/2019: Glucose 188, BUN/Cr 18/0.9. eGFR 58. HbA1C 8.1%  04/2019: TSH 4.1   11/27/2018: Glucose 195. BUN/Cr 14/0.69. eGFR >60. Na/K 139/3.5. Rest of the CMP normal.  H/H 12/35. MCV 78. Platelets 297.   08/01/2018: Chol 150, TG 143, HDL 63, LDL 64  Review of Systems  Cardiovascular: Negative for chest pain, dyspnea on exertion, leg swelling, palpitations and syncope.       Today's Vitals   05/22/20  1407  BP: (!) 101/48  Pulse: 62  Resp: 16  Temp: (!) 96.3 F (35.7 C)  TempSrc: Temporal  SpO2: 96%  Weight: 211 lb (95.7 kg)  Height: 5' 5"  (1.651 m)   Body mass index is 35.11 kg/m.   Objective:  Physical Exam Vitals and nursing note reviewed.  Constitutional:      General: She is not in acute distress. Neck:     Vascular: No JVD.  Cardiovascular:     Rate and Rhythm: Normal rate and regular rhythm.     Pulses: Intact distal pulses.     Heart sounds: Normal heart sounds. No murmur heard.   Pulmonary:     Effort: Pulmonary effort is normal.     Breath sounds: Normal breath sounds. No wheezing or rales.  Musculoskeletal:     Right lower leg: No edema.     Left lower leg: No edema.         Assessment & Recommendations:    80 year old African American female with hypertension, type II diabetes mellitus, nonobstructive CAD, history of colon cancer status post surgery in 2001 followed by chemotherapy, currently in remission, rheumatoid arthritis, on Keppra for seizure disorder  CAD without angina: No angina symptoms. Continue Aspirin 81 mg, simvastatin 20 mg  Hypertension: Improved with spironolactone 25 mg daily. Get labs from PCP.  DM: Managed by PCP.  F/u in 6 months   Devone Tousley Esther Hardy, MD [facility]. [place] Pager: [phone] Office: [phone] If no answer Cell [phone]

## 2020-06-03 DIAGNOSIS — E1165 Type 2 diabetes mellitus with hyperglycemia: Secondary | ICD-10-CM | POA: Diagnosis not present

## 2020-06-03 DIAGNOSIS — I10 Essential (primary) hypertension: Secondary | ICD-10-CM | POA: Diagnosis not present

## 2020-06-09 DIAGNOSIS — E876 Hypokalemia: Secondary | ICD-10-CM | POA: Diagnosis not present

## 2020-06-09 DIAGNOSIS — R260 Ataxic gait: Secondary | ICD-10-CM | POA: Diagnosis not present

## 2020-06-09 DIAGNOSIS — I10 Essential (primary) hypertension: Secondary | ICD-10-CM | POA: Diagnosis not present

## 2020-06-09 DIAGNOSIS — M130 Polyarthritis, unspecified: Secondary | ICD-10-CM | POA: Diagnosis not present

## 2020-06-09 DIAGNOSIS — R569 Unspecified convulsions: Secondary | ICD-10-CM | POA: Diagnosis not present

## 2020-06-10 ENCOUNTER — Telehealth: Payer: Self-pay

## 2020-06-10 NOTE — Telephone Encounter (Signed)
Patient called that she had a seizure last Friday her pcp wanted her to make sure to let you know

## 2020-06-11 NOTE — Telephone Encounter (Signed)
For documentation only  Episode described by the patient with loss of consciousness and bladder control loss is suggestive of seizure. Keppra dose increased by Neurology. Continue f/u with them (Dr. Doralee Albino).  Thanks MJP

## 2020-06-26 ENCOUNTER — Other Ambulatory Visit: Payer: Self-pay

## 2020-06-26 ENCOUNTER — Encounter (HOSPITAL_COMMUNITY): Payer: Self-pay

## 2020-06-26 ENCOUNTER — Emergency Department (HOSPITAL_COMMUNITY)
Admission: EM | Admit: 2020-06-26 | Discharge: 2020-06-27 | Disposition: A | Discharge status: Home / Self Care | Payer: Medicare Other | Attending: Emergency Medicine | Admitting: Emergency Medicine

## 2020-06-26 DIAGNOSIS — E876 Hypokalemia: Secondary | ICD-10-CM | POA: Diagnosis not present

## 2020-06-26 DIAGNOSIS — Z79899 Other long term (current) drug therapy: Secondary | ICD-10-CM | POA: Insufficient documentation

## 2020-06-26 DIAGNOSIS — I10 Essential (primary) hypertension: Secondary | ICD-10-CM | POA: Diagnosis not present

## 2020-06-26 DIAGNOSIS — E1165 Type 2 diabetes mellitus with hyperglycemia: Secondary | ICD-10-CM | POA: Insufficient documentation

## 2020-06-26 DIAGNOSIS — R112 Nausea with vomiting, unspecified: Secondary | ICD-10-CM

## 2020-06-26 DIAGNOSIS — I25119 Atherosclerotic heart disease of native coronary artery with unspecified angina pectoris: Secondary | ICD-10-CM | POA: Diagnosis not present

## 2020-06-26 DIAGNOSIS — R197 Diarrhea, unspecified: Secondary | ICD-10-CM | POA: Insufficient documentation

## 2020-06-26 DIAGNOSIS — Z7982 Long term (current) use of aspirin: Secondary | ICD-10-CM | POA: Insufficient documentation

## 2020-06-26 DIAGNOSIS — Z7984 Long term (current) use of oral hypoglycemic drugs: Secondary | ICD-10-CM | POA: Diagnosis not present

## 2020-06-26 DIAGNOSIS — Z85038 Personal history of other malignant neoplasm of large intestine: Secondary | ICD-10-CM | POA: Diagnosis not present

## 2020-06-26 LAB — COMPREHENSIVE METABOLIC PANEL
ALT: 14 U/L (ref 0–44)
AST: 16 U/L (ref 15–41)
Albumin: 3.4 g/dL — ABNORMAL LOW (ref 3.5–5.0)
Alkaline Phosphatase: 52 U/L (ref 38–126)
Anion gap: 8 (ref 5–15)
BUN: 18 mg/dL (ref 8–23)
CO2: 25 mmol/L (ref 22–32)
Calcium: 8.4 mg/dL — ABNORMAL LOW (ref 8.9–10.3)
Chloride: 103 mmol/L (ref 98–111)
Creatinine, Ser: 0.75 mg/dL (ref 0.44–1.00)
GFR, Estimated: >60 mL/min (ref >60)
Glucose, Bld: 206 mg/dL — ABNORMAL HIGH (ref 70–99)
Potassium: 3.4 mmol/L — ABNORMAL LOW (ref 3.5–5.1)
Sodium: 136 mmol/L (ref 135–145)
Total Bilirubin: 1.7 mg/dL — ABNORMAL HIGH (ref 0.3–1.2)
Total Protein: 6.5 g/dL (ref 6.5–8.1)

## 2020-06-26 LAB — CBC
HCT: 35.8 % — ABNORMAL LOW (ref 36.0–46.0)
Hemoglobin: 12.1 g/dL (ref 12.0–15.0)
MCH: 26.8 pg (ref 26.0–34.0)
MCHC: 33.8 g/dL (ref 30.0–36.0)
MCV: 79.2 fL — ABNORMAL LOW (ref 80.0–100.0)
Platelets: 261 10*3/uL (ref 150–400)
RBC: 4.52 MIL/uL (ref 3.87–5.11)
RDW: 12.4 % (ref 11.5–15.5)
WBC: 11.2 10*3/uL — ABNORMAL HIGH (ref 4.0–10.5)
nRBC: 0 % (ref 0.0–0.2)

## 2020-06-26 LAB — LIPASE, BLOOD: Lipase: 26 U/L (ref 11–51)

## 2020-06-26 MED ORDER — LOPERAMIDE HCL 2 MG PO CAPS
4.0000 mg | ORAL_CAPSULE | Freq: Once | ORAL | Status: AC
  Administered 2020-06-26: 4 mg via ORAL
  Filled 2020-06-26: qty 2

## 2020-06-26 MED ORDER — ONDANSETRON HCL 4 MG/2ML IJ SOLN
4.0000 mg | Freq: Once | INTRAMUSCULAR | Status: AC
  Administered 2020-06-26: 4 mg via INTRAVENOUS
  Filled 2020-06-26: qty 2

## 2020-06-26 MED ORDER — LACTATED RINGERS IV BOLUS
1000.0000 mL | Freq: Once | INTRAVENOUS | Status: AC
  Administered 2020-06-26: 1000 mL via INTRAVENOUS
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
## 2020-06-26 NOTE — ED Triage Notes (Signed)
Pt reports vomiting and diarrhea that started about 5pm this afternoon. Reports she has vomited and had diarrhea numerous times today.

## 2020-06-27 DIAGNOSIS — R197 Diarrhea, unspecified: Secondary | ICD-10-CM | POA: Diagnosis not present

## 2020-06-27 LAB — URINALYSIS, ROUTINE W REFLEX MICROSCOPIC
Bilirubin Urine: NEGATIVE
Glucose, UA: NEGATIVE mg/dL
Hgb urine dipstick: NEGATIVE
Ketones, ur: 20 mg/dL — AB
Leukocytes,Ua: NEGATIVE
Nitrite: NEGATIVE
Protein, ur: NEGATIVE mg/dL
Specific Gravity, Urine: 1.017 (ref 1.005–1.030)
pH: 5 (ref 5.0–8.0)

## 2020-06-27 MED ORDER — ONDANSETRON HCL 4 MG PO TABS: 4.0000 mg | ORAL_TABLET | Freq: Four times a day (QID) | ORAL | 0 refills | Status: AC | PRN

## 2020-06-27 MED ORDER — POTASSIUM CHLORIDE CRYS ER 20 MEQ PO TBCR
40.0000 meq | EXTENDED_RELEASE_TABLET | Freq: Once | ORAL | Status: AC
  Administered 2020-06-27: 40 meq via ORAL
  Filled 2020-06-27: qty 2

## 2020-06-27 MED ORDER — POTASSIUM CHLORIDE CRYS ER 20 MEQ PO TBCR: 20.0000 meq | EXTENDED_RELEASE_TABLET | Freq: Two times a day (BID) | ORAL | 0 refills | Status: AC

## 2020-06-27 NOTE — Discharge Instructions (Signed)
Take loperamide (Imodium A-D) as needed for diarrhea.  Return if abdominal pain seems like it is getting worse or if you start running a high fever.

## 2020-07-25 ENCOUNTER — Encounter: Payer: Self-pay | Admitting: Pharmacist
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
## 2020-08-01 ENCOUNTER — Other Ambulatory Visit: Payer: Self-pay | Admitting: Cardiology

## 2020-08-15 ENCOUNTER — Other Ambulatory Visit: Payer: Self-pay | Admitting: Family Medicine

## 2020-08-15 ENCOUNTER — Ambulatory Visit (INDEPENDENT_AMBULATORY_CARE_PROVIDER_SITE_OTHER): Payer: Medicare Other

## 2020-08-15 DIAGNOSIS — I499 Cardiac arrhythmia, unspecified: Secondary | ICD-10-CM

## 2020-08-15 DIAGNOSIS — G40319 Generalized idiopathic epilepsy and epileptic syndromes, intractable, without status epilepticus: Secondary | ICD-10-CM

## 2020-08-19 DIAGNOSIS — G40319 Generalized idiopathic epilepsy and epileptic syndromes, intractable, without status epilepticus: Secondary | ICD-10-CM

## 2020-08-19 DIAGNOSIS — I499 Cardiac arrhythmia, unspecified: Secondary | ICD-10-CM | POA: Diagnosis not present

## 2020-08-21 ENCOUNTER — Other Ambulatory Visit: Payer: Self-pay | Admitting: Cardiology

## 2020-08-21 DIAGNOSIS — I10 Essential (primary) hypertension: Secondary | ICD-10-CM

## 2020-10-05 ENCOUNTER — Other Ambulatory Visit: Payer: Self-pay | Admitting: Cardiology

## 2020-10-05 DIAGNOSIS — I10 Essential (primary) hypertension: Secondary | ICD-10-CM

## 2020-10-08 ENCOUNTER — Other Ambulatory Visit: Payer: Self-pay | Admitting: Cardiology

## 2020-10-08 DIAGNOSIS — I10 Essential (primary) hypertension: Secondary | ICD-10-CM

## 2020-10-09 ENCOUNTER — Other Ambulatory Visit: Payer: Self-pay | Admitting: Family Medicine

## 2020-10-09 ENCOUNTER — Other Ambulatory Visit (HOSPITAL_COMMUNITY): Payer: Self-pay | Admitting: Family Medicine

## 2020-10-09 DIAGNOSIS — R2242 Localized swelling, mass and lump, left lower limb: Secondary | ICD-10-CM

## 2020-10-10 ENCOUNTER — Emergency Department (HOSPITAL_COMMUNITY)
Admission: EM | Admit: 2020-10-10 | Discharge: 2020-10-10 | Disposition: A | Discharge status: Home / Self Care | Payer: Medicare Other | Attending: Emergency Medicine | Admitting: Emergency Medicine

## 2020-10-10 ENCOUNTER — Encounter (HOSPITAL_COMMUNITY): Payer: Self-pay | Admitting: *Deleted

## 2020-10-10 ENCOUNTER — Ambulatory Visit (HOSPITAL_COMMUNITY)
Admission: RE | Admit: 2020-10-10 | Discharge: 2020-10-10 | Disposition: A | Discharge status: Home / Self Care | Payer: Medicare Other | Source: Ambulatory Visit | Attending: Family Medicine | Admitting: Family Medicine

## 2020-10-10 ENCOUNTER — Other Ambulatory Visit: Payer: Self-pay

## 2020-10-10 ENCOUNTER — Emergency Department (HOSPITAL_COMMUNITY): Payer: Medicare Other

## 2020-10-10 DIAGNOSIS — Z85038 Personal history of other malignant neoplasm of large intestine: Secondary | ICD-10-CM | POA: Insufficient documentation

## 2020-10-10 DIAGNOSIS — I2510 Atherosclerotic heart disease of native coronary artery without angina pectoris: Secondary | ICD-10-CM | POA: Diagnosis not present

## 2020-10-10 DIAGNOSIS — M79605 Pain in left leg: Secondary | ICD-10-CM | POA: Diagnosis present

## 2020-10-10 DIAGNOSIS — I10 Essential (primary) hypertension: Secondary | ICD-10-CM | POA: Diagnosis not present

## 2020-10-10 DIAGNOSIS — R2242 Localized swelling, mass and lump, left lower limb: Secondary | ICD-10-CM

## 2020-10-10 DIAGNOSIS — E119 Type 2 diabetes mellitus without complications: Secondary | ICD-10-CM | POA: Insufficient documentation

## 2020-10-10 DIAGNOSIS — I2699 Other pulmonary embolism without acute cor pulmonale: Secondary | ICD-10-CM

## 2020-10-10 DIAGNOSIS — R0789 Other chest pain: Secondary | ICD-10-CM | POA: Diagnosis not present

## 2020-10-10 LAB — COMPREHENSIVE METABOLIC PANEL
ALT: 18 U/L (ref 0–44)
AST: 38 U/L (ref 15–41)
Albumin: 3.1 g/dL — ABNORMAL LOW (ref 3.5–5.0)
Alkaline Phosphatase: 123 U/L (ref 38–126)
Anion gap: 9 (ref 5–15)
BUN: 14 mg/dL (ref 8–23)
CO2: 27 mmol/L (ref 22–32)
Calcium: 8.4 mg/dL — ABNORMAL LOW (ref 8.9–10.3)
Chloride: 100 mmol/L (ref 98–111)
Creatinine, Ser: 0.74 mg/dL (ref 0.44–1.00)
GFR, Estimated: >60 mL/min (ref >60)
Glucose, Bld: 197 mg/dL — ABNORMAL HIGH (ref 70–99)
Potassium: 4.1 mmol/L (ref 3.5–5.1)
Sodium: 136 mmol/L (ref 135–145)
Total Bilirubin: 1.3 mg/dL — ABNORMAL HIGH (ref 0.3–1.2)
Total Protein: 6.7 g/dL (ref 6.5–8.1)

## 2020-10-10 LAB — CBC WITH DIFFERENTIAL/PLATELET
Abs Immature Granulocytes: 0.06 10*3/uL (ref 0.00–0.07)
Basophils Absolute: 0.1 10*3/uL (ref 0.0–0.1)
Basophils Relative: 0 %
Eosinophils Absolute: 0.1 10*3/uL (ref 0.0–0.5)
Eosinophils Relative: 1 %
HCT: 32.6 % — ABNORMAL LOW (ref 36.0–46.0)
Hemoglobin: 11.1 g/dL — ABNORMAL LOW (ref 12.0–15.0)
Immature Granulocytes: 0 %
Lymphocytes Relative: 10 %
Lymphs Abs: 1.4 10*3/uL (ref 0.7–4.0)
MCH: 25.9 pg — ABNORMAL LOW (ref 26.0–34.0)
MCHC: 34 g/dL (ref 30.0–36.0)
MCV: 76.2 fL — ABNORMAL LOW (ref 80.0–100.0)
Monocytes Absolute: 0.7 10*3/uL (ref 0.1–1.0)
Monocytes Relative: 4 %
Neutro Abs: 12.6 10*3/uL — ABNORMAL HIGH (ref 1.7–7.7)
Neutrophils Relative %: 85 %
Platelets: 229 10*3/uL (ref 150–400)
RBC: 4.28 MIL/uL (ref 3.87–5.11)
RDW: 13.5 % (ref 11.5–15.5)
WBC: 14.9 10*3/uL — ABNORMAL HIGH (ref 4.0–10.5)
nRBC: 0 % (ref 0.0–0.2)

## 2020-10-10 LAB — TROPONIN I (HIGH SENSITIVITY)
Troponin I (High Sensitivity): 5 ng/L (ref <18)
Troponin I (High Sensitivity): 5 ng/L (ref <18)

## 2020-10-10 MED ORDER — RIVAROXABAN 15 MG PO TABS
15.0000 mg | ORAL_TABLET | Freq: Once | ORAL | Status: AC
  Administered 2020-10-10: 15 mg via ORAL
  Filled 2020-10-10: qty 1

## 2020-10-10 MED ORDER — RIVAROXABAN 15 MG PO TABS: 15.0000 mg | ORAL_TABLET | Freq: Once | ORAL | Status: DC

## 2020-10-10 MED ORDER — RIVAROXABAN (XARELTO) EDUCATION KIT FOR DVT/PE PATIENTS: PACK | Freq: Once | Status: AC

## 2020-10-10 MED ORDER — RIVAROXABAN (XARELTO) VTE STARTER PACK (15 & 20 MG)
ORAL_TABLET | ORAL | 0 refills | Status: DC
Start: 2020-10-10 — End: 2020-11-01

## 2020-10-10 MED ORDER — IOHEXOL 350 MG/ML SOLN
100.0000 mL | Freq: Once | INTRAVENOUS | Status: AC | PRN
  Administered 2020-10-10: 100 mL via INTRAVENOUS

## 2020-10-10 NOTE — Discharge Instructions (Addendum)
You were seen in the emergency department today with a blood clot in your leg.  We are treating this by starting you on a blood thinner.  You will no longer take your aspirin or ibuprofen while on this blood thinner as it can increase her chance of bleeding.   Please follow closely with your primary care doctor.  If you notice black or blood in your bowel movements, have sudden severe headache, fall with head injury you need to present to the emergency department immediately and or call 911.    Information on my medicine - XARELTO (rivaroxaban)  This medication education was reviewed with me or my healthcare representative as part of my discharge preparation.    WHY WAS XARELTO PRESCRIBED FOR YOU? Xarelto was prescribed to treat blood clots that may have been found in the veins of your legs (deep vein thrombosis) or in your lungs (pulmonary embolism) and to reduce the risk of them occurring again.  What do you need to know about Xarelto? The starting dose is one 15 mg tablet taken TWICE daily with food for the FIRST 21 DAYS then  the dose is changed to one 20 mg tablet taken ONCE A DAY with your evening meal.  DO NOT stop taking Xarelto without talking to the health care provider who prescribed the medication.  Refill your prescription for 20 mg tablets before you run out.  After discharge, you should have regular check-up appointments with your healthcare provider that is prescribing your Xarelto.  In the future your dose may need to be changed if your kidney function changes by a significant amount.  What do you do if you miss a dose? If you are taking Xarelto TWICE DAILY and you miss a dose, take it as soon as you remember. You may take two 15 mg tablets (total 30 mg) at the same time then resume your regularly scheduled 15 mg twice daily the next day.  If you are taking Xarelto ONCE DAILY and you miss a dose, take it as soon as you remember on the same day then continue your  regularly scheduled once daily regimen the next day. Do not take two doses of Xarelto at the same time.   Important Safety Information Xarelto is a blood thinner medicine that can cause bleeding. You should call your healthcare provider right away if you experience any of the following: ? Bleeding from an injury or your nose that does not stop. ? Unusual colored urine (red or dark brown) or unusual colored stools (red or black). ? Unusual bruising for unknown reasons. ? A serious fall or if you hit your head (even if there is no bleeding).  Some medicines may interact with Xarelto and might increase your risk of bleeding while on Xarelto. To help avoid this, consult your healthcare provider or pharmacist prior to using any new prescription or non-prescription medications, including herbals, vitamins, non-steroidal anti-inflammatory drugs (NSAIDs) and supplements.  This website has more information on Xarelto: [email or web address]
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
## 2020-10-10 NOTE — ED Triage Notes (Signed)
Notified by PCP she had blood clot left leg today, referred here for treatment

## 2020-10-12 ENCOUNTER — Other Ambulatory Visit: Payer: Self-pay | Admitting: Cardiology

## 2020-10-12 DIAGNOSIS — I10 Essential (primary) hypertension: Secondary | ICD-10-CM

## 2020-10-23 NOTE — Progress Notes (Signed)
Follow up visit DVT/PE   Subjective:   Melissa Reynolds, female    DOB: 13-Jan-1941, 80 y.o.   MRN: [id number]   Chief complaint: DVT/PE  80 year old African American female with hypertension, type II diabetes mellitus, moderate CAD, history of colon cancer status post surgery in 2001 followed by chemotherapy, currently in remission, rheumatoid arthritis, on Keppra for seizure disorder.  Patient was last seen in our office 05/22/2020 by Dr. Virgina Jock at which time blood pressure control had improved and leg swelling resolved with addition of spironolactone, she was then advised to follow-up in 6 months.  However patient presented to [facility] emergency department 10/10/2020 with points of left leg pain and swelling.  Work-up revealed left popliteal DVT and multiple small peripheral pulmonary emboli of the right pulmonary artery.  Patient was therefore discharged with Xarelto which she is tolerating without bleeding diathesis.  She is following with her PCP Dr. Berdine Addison closely for management of anticoagulation for DVT and PE.  Patient is also complaining of worsening fatigue over the last several weeks.   Current Outpatient Medications on File Prior to Visit  Medication Sig Dispense Refill   RIVAROXABAN (XARELTO) VTE STARTER PACK (15 & 20 MG) Follow package directions: Take one 41m tablet by mouth twice a day. On day 22, switch to one 246mtablet once a day. Take with food. 51 each 0   acetaminophen (TYLENOL) 500 MG tablet Take 1,000 mg by mouth every 6 (six) hours as needed for moderate pain or headache.     amLODipine (NORVASC) 5 MG tablet Take 5 mg by mouth 2 (two) times daily.      cholecalciferol (VITAMIN D) 25 MCG (1000 UT) tablet Take 2,000 Units by mouth daily.      cyclobenzaprine (FLEXERIL) 10 MG tablet Take 10 mg by mouth 3 (three) times daily as needed for muscle spasms.     empagliflozin (JARDIANCE) 10 MG TABS tablet Take 1 tablet (10 mg total) by mouth daily before breakfast. 90  tablet 2   folic acid (FOLVITE) 1 MG tablet Take 1 mg by mouth daily.     gabapentin (NEURONTIN) 100 MG capsule Take 100 mg by mouth at bedtime.     hydrochlorothiazide (HYDRODIURIL) 25 MG tablet Take 25 mg by mouth daily.      HYDROcodone-acetaminophen (NORCO) 10-325 MG tablet Take 1 tablet by mouth every 6 (six) hours as needed.     Insulin Glargine (BASAGLAR KWIKPEN) 100 UNIT/ML Inject 5 Units into the skin at bedtime.     isosorbide mononitrate (IMDUR) 60 MG 24 hr tablet Take 1 tablet by mouth once daily 90 tablet 0   levETIRAcetam (KEPPRA) 500 MG tablet Take 500 mg by mouth 2 (two) times daily.     metFORMIN (GLUCOPHAGE) 500 MG tablet Take 500 mg by mouth 2 (two) times daily with a meal.     metoprolol tartrate (LOPRESSOR) 50 MG tablet Take 1 tablet by mouth twice daily 180 tablet 0   nitroGLYCERIN (NITROSTAT) 0.4 MG SL tablet Place 0.4 mg under the tongue every 5 (five) minutes x 3 doses as needed.     ondansetron (ZOFRAN) 4 MG tablet Take 1 tablet (4 mg total) by mouth every 6 (six) hours as needed for nausea or vomiting. 12 tablet 0   pantoprazole (PROTONIX) 40 MG tablet Take 1 tablet by mouth once daily 90 tablet 0   potassium chloride SA (KLOR-CON) 20 MEQ tablet Take 1 tablet (20 mEq total) by mouth 2 (two) times daily.  10 tablet 0   predniSONE (DELTASONE) 5 MG tablet Take 5 mg by mouth daily.      rosuvastatin (CRESTOR) 5 MG tablet Take 1 tablet (5 mg total) by mouth daily. 30 tablet 6   No current facility-administered medications on file prior to visit.    Cardiovascular studies: EKG 10/24/2020: Sinus rhythm 61 bpm Possible anterior and inferior infarct old Voltage complexes  Chest CT angio 10/10/2020: Multiple small peripheral pulmonary emboli are noted in branches of the right pulmonary artery  Mild coronary artery calcifications are noted.  Aortic Atherosclerosis  Left lower extremity DVT study 10/10/2020: Acute thrombosis of the popliteal, posterior tibial, and peroneal  veins.  EKG 04/17/2020: Sinus rhythm 61 bpnm Low voltage in precordial leads Possible old anterior and inferior infarct  Coronary angiography 10/24/2018: Extremely tortuous vessels with distal LAD focal 60% stenosis, unchanged compared to prior. TIMI II flow seen in all vessels that improved with IC NTG Her chest pain is likely due to microvascular dysfunction, rather than the stable moderate distal LAD lesion.  Echocardiogram 09/13/2018: Left ventricle cavity is normal in size. Mild concentric hypertrophy of the left ventricle. Normal global wall motion. Doppler evidence of grade I (impaired) diastolic dysfunction, normal LAP. Calculated EF 63%.  Mild (Grade I) mitral regurgitation.  Mild tricuspid regurgitation. Estimated pulmonary artery systolic pressure 22 mmHg.  Mild pulmonic regurgitation. No significant change compared to previous study on 04/05/2017.   EKG 03/29/2019: Sinus rhythm 76 bpm. Poor R wave progression. Otherwise normal EKG   Recent labs: 10/10/2020:  Hemoglobin 11.1, hematocrit 32.6, MCV 76.2, platelet 229 Sodium 136, potassium 4.1, glucose 197, BUN 14, creatinine 0.74, AST 30, ALT 18, alk phos 120, GFR >60  11/26/2019: Glucose 154, BUN/Cr 19/0.81. EGFR >60. Na/K 141/3.5. Rest of the CMP normal H/H 12/36. MCV 79. Platelets 295 TSH 4.2 normal   09/24/2019: Glucose 188, BUN/Cr 18/0.9. eGFR 58. HbA1C 8.1%  04/2019: TSH 4.1   11/27/2018: Glucose 195. BUN/Cr 14/0.69. eGFR >60. Na/K 139/3.5. Rest of the CMP normal.  H/H 12/35. MCV 78. Platelets 297.   08/01/2018: Chol 150, TG 143, HDL 63, LDL 64  Review of Systems  Constitutional: Positive for malaise/fatigue.  Cardiovascular:  Positive for dyspnea on exertion. Negative for chest pain, leg swelling, palpitations and syncope.      Today's Vitals   10/24/20 1339  BP: 110/70  Pulse: 63  Temp: 97.9 F (36.6 C)  SpO2: 96%  Weight: 195 lb (88.5 kg)  Height: 5' 5"  (1.651 m)   Body mass index is 32.45  kg/m.   Objective:  Physical Exam Vitals and nursing note reviewed.  Constitutional:      General: She is not in acute distress. Neck:     Vascular: No JVD.  Cardiovascular:     Rate and Rhythm: Normal rate and regular rhythm.     Pulses: Intact distal pulses.     Heart sounds: Normal heart sounds. No murmur heard. Pulmonary:     Effort: Pulmonary effort is normal.     Breath sounds: Normal breath sounds. No wheezing or rales.  Musculoskeletal:        General: No swelling.     Right lower leg: No edema.     Left lower leg: No edema.       Legs:  Skin:    General: Skin is warm and dry.     Findings: No erythema.      Assessment & Recommendations:    80 year old [place] American female with hypertension, type  II diabetes mellitus, nonobstructive CAD, history of colon cancer status post surgery in 2001 followed by chemotherapy, currently in remission, rheumatoid arthritis, on Keppra for seizure disorder.   Diagnosed with acute left popliteal DVT as well as PE on 10/10/2020 now on Xarelto.   DVT/PE:  Continue Xarelto as she is tolerating this without bleeding diathesis.  Patient is following with PCP for management of anticoagulation and DVT/PE   CAD without angina: No angina symptoms. Hold aspirin given current Xarelto use  Continue simvastatin 20 mg  Hypertension: Blood pressure soft and patient complaining of worsening fatigue Reduce spironolactone from 25 mg to 12.5 mg once daily  Will not make other changes to patient's antihypertensive medications at this time   Fatigue:  In view of worsening fatigue as well as PE will obtain repeat echocardiogram   DM: Managed by PCP.  Patient wishes to keep upcoming appointment with Dr. Virgina Jock next month in July.    Alethia Berthold, PA-C 10/24/2020, 2:49 PM Office: [phone]

## 2020-10-24 ENCOUNTER — Encounter: Payer: Self-pay | Admitting: Student

## 2020-10-24 ENCOUNTER — Ambulatory Visit: Payer: Medicare Other | Admitting: Student

## 2020-10-24 ENCOUNTER — Other Ambulatory Visit: Payer: Self-pay

## 2020-10-24 VITALS — BP 110/70 | HR 63 | Temp 97.9°F | Ht 65.0 in | Wt 195.0 lb

## 2020-10-24 DIAGNOSIS — I25118 Atherosclerotic heart disease of native coronary artery with other forms of angina pectoris: Secondary | ICD-10-CM

## 2020-10-24 DIAGNOSIS — I2699 Other pulmonary embolism without acute cor pulmonale: Secondary | ICD-10-CM

## 2020-10-24 DIAGNOSIS — I82432 Acute embolism and thrombosis of left popliteal vein: Secondary | ICD-10-CM

## 2020-10-24 DIAGNOSIS — R5383 Other fatigue: Secondary | ICD-10-CM

## 2020-10-24 DIAGNOSIS — I10 Essential (primary) hypertension: Secondary | ICD-10-CM

## 2020-10-24 MED ORDER — SPIRONOLACTONE 25 MG PO TABS: 12.5000 mg | ORAL_TABLET | Freq: Every day | ORAL | 0 refills | Status: AC

## 2020-10-29 ENCOUNTER — Other Ambulatory Visit: Payer: Self-pay

## 2020-10-29 ENCOUNTER — Observation Stay (HOSPITAL_BASED_OUTPATIENT_CLINIC_OR_DEPARTMENT_OTHER): Payer: Medicare Other

## 2020-10-29 ENCOUNTER — Emergency Department (HOSPITAL_COMMUNITY): Payer: Medicare Other

## 2020-10-29 ENCOUNTER — Other Ambulatory Visit (HOSPITAL_COMMUNITY): Payer: Self-pay | Admitting: *Deleted

## 2020-10-29 ENCOUNTER — Observation Stay (HOSPITAL_COMMUNITY): Payer: Medicare Other

## 2020-10-29 ENCOUNTER — Inpatient Hospital Stay (HOSPITAL_COMMUNITY)
Admission: EM | Admit: 2020-10-29 | Discharge: 2020-11-01 | DRG: 176 | Disposition: A | Discharge status: Hospice | Payer: Medicare Other | Attending: Internal Medicine | Admitting: Internal Medicine

## 2020-10-29 ENCOUNTER — Encounter (HOSPITAL_COMMUNITY): Payer: Self-pay | Admitting: *Deleted

## 2020-10-29 DIAGNOSIS — I2609 Other pulmonary embolism with acute cor pulmonale: Secondary | ICD-10-CM

## 2020-10-29 DIAGNOSIS — Z90710 Acquired absence of both cervix and uterus: Secondary | ICD-10-CM

## 2020-10-29 DIAGNOSIS — K5900 Constipation, unspecified: Secondary | ICD-10-CM | POA: Diagnosis present

## 2020-10-29 DIAGNOSIS — Z7901 Long term (current) use of anticoagulants: Secondary | ICD-10-CM

## 2020-10-29 DIAGNOSIS — Z6832 Body mass index (BMI) 32.0-32.9, adult: Secondary | ICD-10-CM

## 2020-10-29 DIAGNOSIS — K317 Polyp of stomach and duodenum: Secondary | ICD-10-CM | POA: Diagnosis present

## 2020-10-29 DIAGNOSIS — D649 Anemia, unspecified: Secondary | ICD-10-CM | POA: Diagnosis not present

## 2020-10-29 DIAGNOSIS — R17 Unspecified jaundice: Secondary | ICD-10-CM | POA: Insufficient documentation

## 2020-10-29 DIAGNOSIS — K219 Gastro-esophageal reflux disease without esophagitis: Secondary | ICD-10-CM

## 2020-10-29 DIAGNOSIS — I2699 Other pulmonary embolism without acute cor pulmonale: Secondary | ICD-10-CM | POA: Diagnosis not present

## 2020-10-29 DIAGNOSIS — K7460 Unspecified cirrhosis of liver: Secondary | ICD-10-CM | POA: Diagnosis not present

## 2020-10-29 DIAGNOSIS — E1165 Type 2 diabetes mellitus with hyperglycemia: Secondary | ICD-10-CM

## 2020-10-29 DIAGNOSIS — R7401 Elevation of levels of liver transaminase levels: Secondary | ICD-10-CM | POA: Diagnosis not present

## 2020-10-29 DIAGNOSIS — Z8249 Family history of ischemic heart disease and other diseases of the circulatory system: Secondary | ICD-10-CM

## 2020-10-29 DIAGNOSIS — R188 Other ascites: Secondary | ICD-10-CM | POA: Diagnosis present

## 2020-10-29 DIAGNOSIS — D62 Acute posthemorrhagic anemia: Secondary | ICD-10-CM | POA: Diagnosis present

## 2020-10-29 DIAGNOSIS — Z794 Long term (current) use of insulin: Secondary | ICD-10-CM

## 2020-10-29 DIAGNOSIS — K921 Melena: Secondary | ICD-10-CM | POA: Diagnosis present

## 2020-10-29 DIAGNOSIS — I110 Hypertensive heart disease with heart failure: Secondary | ICD-10-CM | POA: Diagnosis present

## 2020-10-29 DIAGNOSIS — I5032 Chronic diastolic (congestive) heart failure: Secondary | ICD-10-CM | POA: Diagnosis present

## 2020-10-29 DIAGNOSIS — I2510 Atherosclerotic heart disease of native coronary artery without angina pectoris: Secondary | ICD-10-CM | POA: Diagnosis present

## 2020-10-29 DIAGNOSIS — Z7952 Long term (current) use of systemic steroids: Secondary | ICD-10-CM

## 2020-10-29 DIAGNOSIS — M069 Rheumatoid arthritis, unspecified: Secondary | ICD-10-CM | POA: Diagnosis present

## 2020-10-29 DIAGNOSIS — Z9049 Acquired absence of other specified parts of digestive tract: Secondary | ICD-10-CM

## 2020-10-29 DIAGNOSIS — I82502 Chronic embolism and thrombosis of unspecified deep veins of left lower extremity: Secondary | ICD-10-CM

## 2020-10-29 DIAGNOSIS — Z833 Family history of diabetes mellitus: Secondary | ICD-10-CM

## 2020-10-29 DIAGNOSIS — R195 Other fecal abnormalities: Secondary | ICD-10-CM | POA: Insufficient documentation

## 2020-10-29 DIAGNOSIS — D582 Other hemoglobinopathies: Secondary | ICD-10-CM | POA: Diagnosis present

## 2020-10-29 DIAGNOSIS — I8510 Secondary esophageal varices without bleeding: Secondary | ICD-10-CM | POA: Diagnosis present

## 2020-10-29 DIAGNOSIS — Z882 Allergy status to sulfonamides status: Secondary | ICD-10-CM

## 2020-10-29 DIAGNOSIS — I2782 Chronic pulmonary embolism: Secondary | ICD-10-CM | POA: Diagnosis not present

## 2020-10-29 DIAGNOSIS — Z79899 Other long term (current) drug therapy: Secondary | ICD-10-CM

## 2020-10-29 DIAGNOSIS — Z28310 Unvaccinated for covid-19: Secondary | ICD-10-CM

## 2020-10-29 DIAGNOSIS — I10 Essential (primary) hypertension: Secondary | ICD-10-CM

## 2020-10-29 DIAGNOSIS — Z85038 Personal history of other malignant neoplasm of large intestine: Secondary | ICD-10-CM

## 2020-10-29 DIAGNOSIS — E119 Type 2 diabetes mellitus without complications: Secondary | ICD-10-CM | POA: Diagnosis present

## 2020-10-29 DIAGNOSIS — E785 Hyperlipidemia, unspecified: Secondary | ICD-10-CM | POA: Diagnosis present

## 2020-10-29 DIAGNOSIS — E1169 Type 2 diabetes mellitus with other specified complication: Secondary | ICD-10-CM

## 2020-10-29 DIAGNOSIS — Z7984 Long term (current) use of oral hypoglycemic drugs: Secondary | ICD-10-CM

## 2020-10-29 DIAGNOSIS — G40909 Epilepsy, unspecified, not intractable, without status epilepticus: Secondary | ICD-10-CM | POA: Diagnosis present

## 2020-10-29 DIAGNOSIS — Z20822 Contact with and (suspected) exposure to covid-19: Secondary | ICD-10-CM | POA: Diagnosis present

## 2020-10-29 DIAGNOSIS — E669 Obesity, unspecified: Secondary | ICD-10-CM | POA: Diagnosis present

## 2020-10-29 LAB — COMPREHENSIVE METABOLIC PANEL
ALT: 36 U/L (ref 0–44)
AST: 79 U/L — ABNORMAL HIGH (ref 15–41)
Albumin: 2.8 g/dL — ABNORMAL LOW (ref 3.5–5.0)
Alkaline Phosphatase: 221 U/L — ABNORMAL HIGH (ref 38–126)
Anion gap: 11 (ref 5–15)
BUN: 16 mg/dL (ref 8–23)
CO2: 23 mmol/L (ref 22–32)
Calcium: 8.4 mg/dL — ABNORMAL LOW (ref 8.9–10.3)
Chloride: 101 mmol/L (ref 98–111)
Creatinine, Ser: 0.81 mg/dL (ref 0.44–1.00)
GFR, Estimated: >60 mL/min (ref >60)
Glucose, Bld: 186 mg/dL — ABNORMAL HIGH (ref 70–99)
Potassium: 4.2 mmol/L (ref 3.5–5.1)
Sodium: 135 mmol/L (ref 135–145)
Total Bilirubin: 2.1 mg/dL — ABNORMAL HIGH (ref 0.3–1.2)
Total Protein: 6.2 g/dL — ABNORMAL LOW (ref 6.5–8.1)

## 2020-10-29 LAB — IRON AND TIBC
Iron: 52 ug/dL (ref 28–170)
Saturation Ratios: 22 % (ref 10.4–31.8)
TIBC: 235 ug/dL — ABNORMAL LOW (ref 250–450)
UIBC: 183 ug/dL

## 2020-10-29 LAB — PROTIME-INR
INR: 3.2 — ABNORMAL HIGH (ref 0.8–1.2)
Prothrombin Time: 32.5 seconds — ABNORMAL HIGH (ref 11.4–15.2)

## 2020-10-29 LAB — CBC WITH DIFFERENTIAL/PLATELET
Abs Immature Granulocytes: 0.11 10*3/uL — ABNORMAL HIGH (ref 0.00–0.07)
Basophils Absolute: 0.2 10*3/uL — ABNORMAL HIGH (ref 0.0–0.1)
Basophils Relative: 1 %
Eosinophils Absolute: 0.2 10*3/uL (ref 0.0–0.5)
Eosinophils Relative: 1 %
HCT: 30.9 % — ABNORMAL LOW (ref 36.0–46.0)
Hemoglobin: 10.5 g/dL — ABNORMAL LOW (ref 12.0–15.0)
Immature Granulocytes: 1 %
Lymphocytes Relative: 11 %
Lymphs Abs: 2 10*3/uL (ref 0.7–4.0)
MCH: 25.8 pg — ABNORMAL LOW (ref 26.0–34.0)
MCHC: 34 g/dL (ref 30.0–36.0)
MCV: 75.9 fL — ABNORMAL LOW (ref 80.0–100.0)
Monocytes Absolute: 1.3 10*3/uL — ABNORMAL HIGH (ref 0.1–1.0)
Monocytes Relative: 7 %
Neutro Abs: 14.1 10*3/uL — ABNORMAL HIGH (ref 1.7–7.7)
Neutrophils Relative %: 79 %
Platelets: 261 10*3/uL (ref 150–400)
RBC: 4.07 MIL/uL (ref 3.87–5.11)
RDW: 14.9 % (ref 11.5–15.5)
WBC: 17.8 10*3/uL — ABNORMAL HIGH (ref 4.0–10.5)

## 2020-10-29 LAB — TSH: TSH: 4.414 u[IU]/mL (ref 0.350–4.500)

## 2020-10-29 LAB — ECHOCARDIOGRAM COMPLETE
AR max vel: 2.89 cm2
AV Area VTI: 3.38 cm2
AV Area mean vel: 3.18 cm2
AV Mean grad: 3 mmHg
AV Peak grad: 5.1 mmHg
Ao pk vel: 1.13 m/s
Area-P 1/2: 2.36 cm2
Height: 65 in
MV VTI: 3.71 cm2
S' Lateral: 2.89 cm
Weight: 3104 oz

## 2020-10-29 LAB — BILIRUBIN, FRACTIONATED(TOT/DIR/INDIR)
Bilirubin, Direct: 0.8 mg/dL — ABNORMAL HIGH (ref 0.0–0.2)
Indirect Bilirubin: 1.2 mg/dL — ABNORMAL HIGH (ref 0.3–0.9)
Total Bilirubin: 2 mg/dL — ABNORMAL HIGH (ref 0.3–1.2)

## 2020-10-29 LAB — PHOSPHORUS: Phosphorus: 2.9 mg/dL (ref 2.5–4.6)

## 2020-10-29 LAB — APTT
aPTT: 37 seconds — ABNORMAL HIGH (ref 24–36)
aPTT: 135 seconds — ABNORMAL HIGH (ref 24–36)

## 2020-10-29 LAB — VITAMIN B12: Vitamin B-12: 2834 pg/mL — ABNORMAL HIGH (ref 180–914)

## 2020-10-29 LAB — HEMOGLOBIN A1C
Hgb A1c MFr Bld: 6.6 % — ABNORMAL HIGH (ref 4.8–5.6)
Mean Plasma Glucose: 142.72 mg/dL

## 2020-10-29 LAB — FERRITIN: Ferritin: 155 ng/mL (ref 11–307)

## 2020-10-29 LAB — HEPARIN LEVEL (UNFRACTIONATED): Heparin Unfractionated: >1.1 IU/mL — ABNORMAL HIGH (ref 0.30–0.70)

## 2020-10-29 LAB — RESP PANEL BY RT-PCR (FLU A&B, COVID) ARPGX2
Influenza A by PCR: NEGATIVE
Influenza B by PCR: NEGATIVE
SARS Coronavirus 2 by RT PCR: NEGATIVE

## 2020-10-29 LAB — TYPE AND SCREEN
ABO/RH(D): AB POS
Antibody Screen: NEGATIVE

## 2020-10-29 LAB — GLUCOSE, CAPILLARY: Glucose-Capillary: 175 mg/dL — ABNORMAL HIGH (ref 70–99)

## 2020-10-29 LAB — CBG MONITORING, ED: Glucose-Capillary: 124 mg/dL — ABNORMAL HIGH (ref 70–99)

## 2020-10-29 LAB — MAGNESIUM: Magnesium: 1.1 mg/dL — ABNORMAL LOW (ref 1.7–2.4)

## 2020-10-29 LAB — POC OCCULT BLOOD, ED: Fecal Occult Bld: POSITIVE — AB

## 2020-10-29 MED ORDER — VITAMIN D 25 MCG (1000 UNIT) PO TABS
2000.0000 [IU] | ORAL_TABLET | Freq: Every day | ORAL | Status: DC
  Administered 2020-10-29 – 2020-11-01 (×4): 2000 [IU] via ORAL
  Filled 2020-10-29 (×4): qty 2

## 2020-10-29 MED ORDER — HEPARIN (PORCINE) 25000 UT/250ML-% IV SOLN
700.0000 [IU]/h | INTRAVENOUS | Status: DC
  Administered 2020-10-29: 1200 [IU]/h via INTRAVENOUS
  Filled 2020-10-29 (×3): qty 250

## 2020-10-29 MED ORDER — HYDROCODONE-ACETAMINOPHEN 5-325 MG PO TABS
1.0000 | ORAL_TABLET | Freq: Once | ORAL | Status: AC
  Administered 2020-10-29: 1 via ORAL
  Filled 2020-10-29: qty 1

## 2020-10-29 MED ORDER — ACETAMINOPHEN 650 MG RE SUPP: 650.0000 mg | Freq: Four times a day (QID) | RECTAL | Status: DC | PRN

## 2020-10-29 MED ORDER — ONDANSETRON HCL 4 MG/2ML IJ SOLN: 4.0000 mg | Freq: Four times a day (QID) | INTRAMUSCULAR | Status: DC | PRN

## 2020-10-29 MED ORDER — SODIUM CHLORIDE 0.9 % IV SOLN
1.0000 g | INTRAVENOUS | Status: DC
  Administered 2020-10-29 – 2020-10-31 (×3): 1 g via INTRAVENOUS
  Filled 2020-10-29 (×3): qty 10

## 2020-10-29 MED ORDER — ACETAMINOPHEN 325 MG PO TABS
650.0000 mg | ORAL_TABLET | Freq: Four times a day (QID) | ORAL | Status: DC | PRN
Start: 2020-10-29 — End: 2020-11-01
  Administered 2020-10-30: 650 mg via ORAL
  Filled 2020-10-29: qty 2

## 2020-10-29 MED ORDER — PANTOPRAZOLE SODIUM 40 MG PO TBEC: 40.0000 mg | DELAYED_RELEASE_TABLET | Freq: Every day | ORAL | Status: DC

## 2020-10-29 MED ORDER — TEMAZEPAM 7.5 MG PO CAPS
7.5000 mg | ORAL_CAPSULE | Freq: Every evening | ORAL | Status: DC | PRN
  Administered 2020-10-29 – 2020-10-31 (×3): 7.5 mg via ORAL
  Filled 2020-10-29 (×3): qty 1

## 2020-10-29 MED ORDER — LEVETIRACETAM 500 MG PO TABS
500.0000 mg | ORAL_TABLET | Freq: Two times a day (BID) | ORAL | Status: DC
  Administered 2020-10-29 – 2020-11-01 (×7): 500 mg via ORAL
  Filled 2020-10-29 (×7): qty 1

## 2020-10-29 MED ORDER — INSULIN ASPART 100 UNIT/ML IJ SOLN
0.0000 [IU] | Freq: Three times a day (TID) | INTRAMUSCULAR | Status: DC
  Administered 2020-10-30 – 2020-11-01 (×3): 2 [IU] via SUBCUTANEOUS

## 2020-10-29 MED ORDER — ISOSORBIDE MONONITRATE ER 60 MG PO TB24
60.0000 mg | ORAL_TABLET | Freq: Every day | ORAL | Status: DC
  Administered 2020-10-29 – 2020-11-01 (×4): 60 mg via ORAL
  Filled 2020-10-29 (×4): qty 1

## 2020-10-29 MED ORDER — HEPARIN BOLUS VIA INFUSION: 2000.0000 [IU] | Freq: Once | INTRAVENOUS | Status: DC

## 2020-10-29 MED ORDER — GABAPENTIN 100 MG PO CAPS
100.0000 mg | ORAL_CAPSULE | Freq: Every day | ORAL | Status: DC
  Administered 2020-10-29 – 2020-10-31 (×3): 100 mg via ORAL
  Filled 2020-10-29 (×3): qty 1

## 2020-10-29 MED ORDER — SODIUM CHLORIDE 0.9 % IV BOLUS
500.0000 mL | Freq: Once | INTRAVENOUS | Status: AC
  Administered 2020-10-29: 500 mL via INTRAVENOUS

## 2020-10-29 MED ORDER — INSULIN DETEMIR 100 UNIT/ML ~~LOC~~ SOLN
10.0000 [IU] | Freq: Every day | SUBCUTANEOUS | Status: DC
  Administered 2020-10-29 – 2020-10-31 (×3): 10 [IU] via SUBCUTANEOUS
  Filled 2020-10-29 (×5): qty 0.1

## 2020-10-29 MED ORDER — EMPAGLIFLOZIN 10 MG PO TABS
10.0000 mg | ORAL_TABLET | Freq: Every day | ORAL | Status: DC
  Administered 2020-10-30 – 2020-11-01 (×3): 10 mg via ORAL
  Filled 2020-10-29 (×5): qty 1

## 2020-10-29 MED ORDER — PREDNISONE 10 MG PO TABS
5.0000 mg | ORAL_TABLET | Freq: Every day | ORAL | Status: DC
  Administered 2020-10-29 – 2020-11-01 (×4): 5 mg via ORAL
  Filled 2020-10-29 (×4): qty 1

## 2020-10-29 MED ORDER — ONDANSETRON HCL 4 MG PO TABS: 4.0000 mg | ORAL_TABLET | Freq: Four times a day (QID) | ORAL | Status: DC | PRN

## 2020-10-29 MED ORDER — HYDROCODONE-ACETAMINOPHEN 10-325 MG PO TABS
1.0000 | ORAL_TABLET | Freq: Four times a day (QID) | ORAL | Status: DC | PRN
  Administered 2020-10-29 – 2020-11-01 (×4): 1 via ORAL
  Filled 2020-10-29 (×4): qty 1

## 2020-10-29 MED ORDER — IOHEXOL 350 MG/ML SOLN
100.0000 mL | Freq: Once | INTRAVENOUS | Status: AC | PRN
  Administered 2020-10-29: 100 mL via INTRAVENOUS

## 2020-10-29 MED ORDER — METOPROLOL TARTRATE 50 MG PO TABS
50.0000 mg | ORAL_TABLET | Freq: Two times a day (BID) | ORAL | Status: DC
  Administered 2020-10-29 – 2020-11-01 (×6): 50 mg via ORAL
  Filled 2020-10-29 (×6): qty 1

## 2020-10-29 MED ORDER — SPIRONOLACTONE 25 MG PO TABS
12.5000 mg | ORAL_TABLET | Freq: Every day | ORAL | Status: DC
  Administered 2020-10-29 – 2020-11-01 (×4): 12.5 mg via ORAL
  Filled 2020-10-29: qty 1
  Filled 2020-10-29: qty 0.5
  Filled 2020-10-29: qty 1
  Filled 2020-10-29 (×4): qty 0.5
  Filled 2020-10-29 (×2): qty 1
  Filled 2020-10-29: qty 0.5

## 2020-10-29 MED ORDER — PANTOPRAZOLE SODIUM 40 MG IV SOLR
40.0000 mg | Freq: Two times a day (BID) | INTRAVENOUS | Status: DC
  Administered 2020-10-29 – 2020-11-01 (×7): 40 mg via INTRAVENOUS
  Filled 2020-10-29 (×7): qty 40

## 2020-10-29 MED ORDER — FOLIC ACID 1 MG PO TABS
1.0000 mg | ORAL_TABLET | Freq: Every day | ORAL | Status: DC
  Administered 2020-10-29 – 2020-11-01 (×4): 1 mg via ORAL
  Filled 2020-10-29 (×4): qty 1

## 2020-10-29 NOTE — Progress Notes (Addendum)
ANTICOAGULATION CONSULT NOTE - Initial Consult  Pharmacy Consult for heparin Indication:  VTE treatment  Allergies  Allergen Reactions   Sulfa Antibiotics Rash    Patient Measurements: Height: 5\' 5"  (165.1 cm) Weight: 88 kg (194 lb) IBW/kg (Calculated) : 57 Heparin Dosing Weight: 76 kg  Vital Signs: Temp: 98.9 F (37.2 C) (06/22 0945) Temp Source: Oral (06/22 0945) BP: 141/70 (06/22 1300) Pulse Rate: 90 (06/22 1300)  Labs: Recent Labs    10/29/20 1009  HGB 10.5*  HCT 30.9*  PLT 261  CREATININE 0.81    Estimated Creatinine Clearance: 58.7 mL/min (by C-G formula based on SCr of 0.81 mg/dL).   Medical History: Past Medical History:  Diagnosis Date   Arthritis    rheumatoid   Colon cancer ([place]) 03/2000   stage 3 adenocarcinoma   Colon cancer ([place]) 08/20/2015   Coronary artery disease    Diabetes mellitus    DVT (deep venous thrombosis) ([facility])    Heartburn    Hypertension    Obesity    Osteoarthritis    RA (rheumatoid arthritis) ([facility])    Syncope and collapse     Medications:  (Not in a hospital admission)   Assessment: Pharmacy consulted to dose heparin in patient for VTE treatment.  Patient recently diagnosed with left leg DVT and PE on 10/10/2020.  CT angio shows pulmonary emboli in RLL which have increased from 6/3. She is on Xarelto prior to admission with last dose 6/21 @ 2100 per patient.  Today would be day 20 from initiation of Xarelto so would still be on BID dosing.   Hgb 10.5- heme positive stool Platelets WNL  Goal of Therapy:  Heparin level 0.3-0.7 units/ml aPTT 66-102 seconds Monitor platelets by anticoagulation protocol: Yes   Plan:  No bolus Start heparin infusion at 1200 units/hr Check anti-Xa level in 8 hours and daily. Continue to monitor H&H and platelets.   Margot Ables, PharmD Clinical Pharmacist 10/29/2020 2:05 PM

## 2020-10-29 NOTE — Progress Notes (Addendum)
[place] for heparin Indication:  VTE treatment  Labs: Recent Labs    10/29/20 1009 10/29/20 1435 10/29/20 1845 10/29/20 2224  HGB 10.5*  --   --   --   HCT 30.9*  --   --   --   PLT 261  --   --   --   APTT  --  37*  --  135*  LABPROT  --   --  32.5*  --   INR  --   --  3.2*  --   HEPARINUNFRC  --  >1.10*  --   --   CREATININE 0.81  --   --   --     Assessment: Pharmacy consulted to dose heparin in patient for VTE treatment.  Patient recently diagnosed with left leg DVT and PE on 10/10/2020.  CT angio shows pulmonary emboli in RLL which have increased from 6/3. She is on Xarelto prior to admission with last dose 6/21 @ 2100 per patient.  Today would be day 20 from initiation of Xarelto so would still be on BID dosing.  Initial aPTT 135 sec INR 3.2.  RN states patient is jaundiced  Goal of Therapy:  Heparin level 0.3-0.7 units/ml aPTT 66-102 seconds Monitor platelets by anticoagulation protocol: Yes   Plan:  Decrease heparin infusion to 1000 units/hr Check anti-Xa level in 8 hours and daily. Continue to monitor H&H and platelets.  Thanks for allowing pharmacy to be a part of this patient's care.  Excell Seltzer, PharmD Clinical Pharmacist 10/29/2020 11:46 PM  Addum:  Repeat blood draw aPTT 83 sec.  Will increase heparin to 1100 units/hr
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
## 2020-10-29 NOTE — Progress Notes (Signed)
*  PRELIMINARY RESULTS* Echocardiogram 2D Echocardiogram has been performed.  Melissa Reynolds 10/29/2020, 4:30 PM

## 2020-10-29 NOTE — ED Triage Notes (Signed)
Pt brought in by [facility] from home with c/o fatigue, not feeling herself, not eating x 2 weeks. Pt recently dx with left leg DVT and PE. CBG 224. O2 sat 97% on RA with EMS. 20g rt ac 350cc NS bolus.
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
## 2020-10-29 NOTE — ED Notes (Signed)
Decline in weakness over a week,  decline in po intake x 2 weeks, No nausea or vomiting, uses cane or walker to ambulate at home. Pt lives wit her sister. Pt verbalized continent.
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
## 2020-10-30 ENCOUNTER — Observation Stay (HOSPITAL_COMMUNITY): Payer: Medicare Other

## 2020-10-30 DIAGNOSIS — I5032 Chronic diastolic (congestive) heart failure: Secondary | ICD-10-CM | POA: Diagnosis present

## 2020-10-30 DIAGNOSIS — R188 Other ascites: Secondary | ICD-10-CM | POA: Diagnosis present

## 2020-10-30 DIAGNOSIS — Z85038 Personal history of other malignant neoplasm of large intestine: Secondary | ICD-10-CM | POA: Diagnosis not present

## 2020-10-30 DIAGNOSIS — D62 Acute posthemorrhagic anemia: Secondary | ICD-10-CM | POA: Diagnosis present

## 2020-10-30 DIAGNOSIS — M069 Rheumatoid arthritis, unspecified: Secondary | ICD-10-CM | POA: Diagnosis present

## 2020-10-30 DIAGNOSIS — K921 Melena: Secondary | ICD-10-CM | POA: Diagnosis present

## 2020-10-30 DIAGNOSIS — E1165 Type 2 diabetes mellitus with hyperglycemia: Secondary | ICD-10-CM | POA: Diagnosis not present

## 2020-10-30 DIAGNOSIS — I8510 Secondary esophageal varices without bleeding: Secondary | ICD-10-CM

## 2020-10-30 DIAGNOSIS — E785 Hyperlipidemia, unspecified: Secondary | ICD-10-CM | POA: Diagnosis present

## 2020-10-30 DIAGNOSIS — Z6832 Body mass index (BMI) 32.0-32.9, adult: Secondary | ICD-10-CM | POA: Diagnosis not present

## 2020-10-30 DIAGNOSIS — Z20822 Contact with and (suspected) exposure to covid-19: Secondary | ICD-10-CM | POA: Diagnosis present

## 2020-10-30 DIAGNOSIS — I2510 Atherosclerotic heart disease of native coronary artery without angina pectoris: Secondary | ICD-10-CM | POA: Diagnosis present

## 2020-10-30 DIAGNOSIS — K317 Polyp of stomach and duodenum: Secondary | ICD-10-CM | POA: Diagnosis present

## 2020-10-30 DIAGNOSIS — I2699 Other pulmonary embolism without acute cor pulmonale: Secondary | ICD-10-CM | POA: Diagnosis present

## 2020-10-30 DIAGNOSIS — I82502 Chronic embolism and thrombosis of unspecified deep veins of left lower extremity: Secondary | ICD-10-CM | POA: Diagnosis present

## 2020-10-30 DIAGNOSIS — D582 Other hemoglobinopathies: Secondary | ICD-10-CM | POA: Diagnosis present

## 2020-10-30 DIAGNOSIS — K7460 Unspecified cirrhosis of liver: Secondary | ICD-10-CM | POA: Diagnosis present

## 2020-10-30 DIAGNOSIS — I2782 Chronic pulmonary embolism: Secondary | ICD-10-CM | POA: Diagnosis not present

## 2020-10-30 DIAGNOSIS — Z7901 Long term (current) use of anticoagulants: Secondary | ICD-10-CM | POA: Diagnosis not present

## 2020-10-30 DIAGNOSIS — G40909 Epilepsy, unspecified, not intractable, without status epilepticus: Secondary | ICD-10-CM | POA: Diagnosis present

## 2020-10-30 DIAGNOSIS — R7401 Elevation of levels of liver transaminase levels: Secondary | ICD-10-CM | POA: Diagnosis present

## 2020-10-30 DIAGNOSIS — K5900 Constipation, unspecified: Secondary | ICD-10-CM | POA: Diagnosis present

## 2020-10-30 DIAGNOSIS — E119 Type 2 diabetes mellitus without complications: Secondary | ICD-10-CM | POA: Diagnosis present

## 2020-10-30 DIAGNOSIS — I110 Hypertensive heart disease with heart failure: Secondary | ICD-10-CM | POA: Diagnosis present

## 2020-10-30 DIAGNOSIS — K219 Gastro-esophageal reflux disease without esophagitis: Secondary | ICD-10-CM | POA: Diagnosis present

## 2020-10-30 DIAGNOSIS — Z833 Family history of diabetes mellitus: Secondary | ICD-10-CM | POA: Diagnosis not present

## 2020-10-30 DIAGNOSIS — E669 Obesity, unspecified: Secondary | ICD-10-CM | POA: Diagnosis present

## 2020-10-30 LAB — GLUCOSE, CAPILLARY
Glucose-Capillary: 97 mg/dL (ref 70–99)
Glucose-Capillary: 177 mg/dL — ABNORMAL HIGH (ref 70–99)
Glucose-Capillary: 125 mg/dL — ABNORMAL HIGH (ref 70–99)
Glucose-Capillary: 139 mg/dL — ABNORMAL HIGH (ref 70–99)

## 2020-10-30 LAB — PROTIME-INR
INR: 2 — ABNORMAL HIGH (ref 0.8–1.2)
Prothrombin Time: 22.5 seconds — ABNORMAL HIGH (ref 11.4–15.2)

## 2020-10-30 LAB — COMPREHENSIVE METABOLIC PANEL
ALT: 36 U/L (ref 0–44)
AST: 90 U/L — ABNORMAL HIGH (ref 15–41)
Albumin: 2.5 g/dL — ABNORMAL LOW (ref 3.5–5.0)
Alkaline Phosphatase: 212 U/L — ABNORMAL HIGH (ref 38–126)
Anion gap: 11 (ref 5–15)
BUN: 16 mg/dL (ref 8–23)
CO2: 22 mmol/L (ref 22–32)
Calcium: 8 mg/dL — ABNORMAL LOW (ref 8.9–10.3)
Chloride: 102 mmol/L (ref 98–111)
Creatinine, Ser: 0.73 mg/dL (ref 0.44–1.00)
GFR, Estimated: >60 mL/min (ref >60)
Glucose, Bld: 126 mg/dL — ABNORMAL HIGH (ref 70–99)
Potassium: 4.9 mmol/L (ref 3.5–5.1)
Sodium: 135 mmol/L (ref 135–145)
Total Bilirubin: 1.6 mg/dL — ABNORMAL HIGH (ref 0.3–1.2)
Total Protein: 5.6 g/dL — ABNORMAL LOW (ref 6.5–8.1)

## 2020-10-30 LAB — APTT
aPTT: 130 seconds — ABNORMAL HIGH (ref 24–36)
aPTT: 142 seconds — ABNORMAL HIGH (ref 24–36)
aPTT: 68 seconds — ABNORMAL HIGH (ref 24–36)
aPTT: 83 seconds — ABNORMAL HIGH (ref 24–36)

## 2020-10-30 LAB — HEPATITIS B SURFACE ANTIGEN: Hepatitis B Surface Ag: NONREACTIVE

## 2020-10-30 LAB — CBC
HCT: 28.5 % — ABNORMAL LOW (ref 36.0–46.0)
Hemoglobin: 9.9 g/dL — ABNORMAL LOW (ref 12.0–15.0)
MCH: 26.3 pg (ref 26.0–34.0)
MCHC: 34.7 g/dL (ref 30.0–36.0)
MCV: 75.8 fL — ABNORMAL LOW (ref 80.0–100.0)
Platelets: 253 10*3/uL (ref 150–400)
RBC: 3.76 MIL/uL — ABNORMAL LOW (ref 3.87–5.11)
RDW: 14.9 % (ref 11.5–15.5)
WBC: 19.5 10*3/uL — ABNORMAL HIGH (ref 4.0–10.5)

## 2020-10-30 LAB — HEPATITIS C ANTIBODY: HCV Ab: NONREACTIVE

## 2020-10-30 LAB — HEPATITIS A ANTIBODY, TOTAL: hep A Total Ab: NONREACTIVE

## 2020-10-30 LAB — HEPATITIS B CORE ANTIBODY, TOTAL: Hep B Core Total Ab: NONREACTIVE

## 2020-10-30 MED ORDER — POLYETHYLENE GLYCOL 3350 17 G PO PACK
17.0000 g | PACK | Freq: Every day | ORAL | Status: DC
  Administered 2020-10-30 – 2020-11-01 (×3): 17 g via ORAL
  Filled 2020-10-30 (×3): qty 1

## 2020-10-30 NOTE — Progress Notes (Signed)
ANTICOAGULATION CONSULT NOTE -   Pharmacy Consult for heparin Indication:  VTE treatment  Allergies  Allergen Reactions   Sulfa Antibiotics Rash    Patient Measurements: Height: 5\' 5"  (165.1 cm) Weight: 88.5 kg (195 lb) IBW/kg (Calculated) : 57 Heparin Dosing Weight: 76 kg  Vital Signs: Temp: 98.4 F (36.9 C) (06/23 0725) Temp Source: Oral (06/23 0725) BP: 128/63 (06/23 0725) Pulse Rate: 80 (06/23 0725)  Labs: Recent Labs    10/29/20 1009 10/29/20 1435 10/29/20 1435 10/29/20 1845 10/29/20 2224 10/30/20 0018 10/30/20 0415 10/30/20 0511 10/30/20 0806  HGB 10.5*  --   --   --   --   --   --  9.9*  --   HCT 30.9*  --   --   --   --   --   --  28.5*  --   PLT 261  --   --   --   --   --   --  253  --   APTT  --  37*   < >  --  135* 83*  --   --  68*  LABPROT  --   --   --  32.5*  --   --   --   --  22.5*  INR  --   --   --  3.2*  --   --   --   --  2.0*  HEPARINUNFRC  --  >1.10*  --   --   --   --   --   --   --   CREATININE 0.81  --   --   --   --   --  0.73  --   --    < > = values in this interval not displayed.     Estimated Creatinine Clearance: 59.6 mL/min (by C-G formula based on SCr of 0.73 mg/dL).   Medical History: Past Medical History:  Diagnosis Date   Arthritis    rheumatoid   Colon cancer ([place]) 03/2000   stage 3 adenocarcinoma   Colon cancer ([place]) 08/20/2015   Coronary artery disease    Diabetes mellitus    DVT (deep venous thrombosis) (HCC)    Heartburn    Hypertension    Obesity    Osteoarthritis    RA (rheumatoid arthritis) ([facility])    Syncope and collapse     Medications:  Medications Prior to Admission  Medication Sig Dispense Refill Last Dose   acetaminophen (TYLENOL) 500 MG tablet Take 1,000 mg by mouth every 6 (six) hours as needed for moderate pain or headache.      amLODipine (NORVASC) 5 MG tablet Take 5 mg by mouth 2 (two) times daily.    10/28/2020   cholecalciferol (VITAMIN D) 25 MCG (1000 UT) tablet Take 2,000 Units by mouth  daily.    10/28/2020   empagliflozin (JARDIANCE) 10 MG TABS tablet Take 1 tablet (10 mg total) by mouth daily before breakfast. 90 tablet 2 09/24/6158   folic acid (FOLVITE) 1 MG tablet Take 1 mg by mouth daily.   10/28/2020   gabapentin (NEURONTIN) 100 MG capsule Take 100 mg by mouth at bedtime.   10/28/2020   hydrochlorothiazide (HYDRODIURIL) 25 MG tablet Take 25 mg by mouth daily.    10/28/2020   HYDROcodone-acetaminophen (NORCO) 10-325 MG tablet Take 1 tablet by mouth every 6 (six) hours as needed.      Insulin Glargine (BASAGLAR KWIKPEN) 100 UNIT/ML Inject 5 Units into the skin at  bedtime.   10/28/2020   isosorbide mononitrate (IMDUR) 60 MG 24 hr tablet Take 1 tablet by mouth once daily 90 tablet 0 10/28/2020   metFORMIN (GLUCOPHAGE) 500 MG tablet Take 500 mg by mouth 2 (two) times daily with a meal.   10/28/2020   metoprolol tartrate (LOPRESSOR) 50 MG tablet Take 1 tablet by mouth twice daily 180 tablet 0 10/28/2020 at 2100   nitroGLYCERIN (NITROSTAT) 0.4 MG SL tablet Place 0.4 mg under the tongue every 5 (five) minutes x 3 doses as needed.   unk at unk   ondansetron (ZOFRAN) 4 MG tablet Take 1 tablet (4 mg total) by mouth every 6 (six) hours as needed for nausea or vomiting. 12 tablet 0    pantoprazole (PROTONIX) 40 MG tablet Take 1 tablet by mouth once daily 90 tablet 0 10/28/2020   potassium chloride SA (KLOR-CON) 20 MEQ tablet Take 1 tablet (20 mEq total) by mouth 2 (two) times daily. 10 tablet 0 10/28/2020   RIVAROXABAN (XARELTO) VTE STARTER PACK (15 & 20 MG) Follow package directions: Take one 15mg  tablet by mouth twice a day. On day 22, switch to one 20mg  tablet once a day. Take with food. 51 each 0 10/29/2020 at 0700   rosuvastatin (CRESTOR) 5 MG tablet Take 1 tablet (5 mg total) by mouth daily. 30 tablet 6 10/28/2020   spironolactone (ALDACTONE) 25 MG tablet Take 0.5 tablets (12.5 mg total) by mouth daily. 60 tablet 0 10/28/2020   cyclobenzaprine (FLEXERIL) 10 MG tablet Take 10 mg by mouth 3  (three) times daily as needed for muscle spasms. (Patient not taking: Reported on 10/29/2020)   Not Taking   levETIRAcetam (KEPPRA) 750 MG tablet Take 750 mg by mouth 2 (two) times daily.      predniSONE (DELTASONE) 5 MG tablet Take 5 mg by mouth daily.  (Patient not taking: Reported on 10/29/2020)   Not Taking    Assessment: Pharmacy consulted to dose heparin in patient for VTE treatment.  Patient recently diagnosed with left leg DVT and PE on 10/10/2020.  CT angio shows pulmonary emboli in RLL which have increased from 6/3. She is on Xarelto prior to admission with last dose 6/21 @ 2100 per patient.  Today would be day 20 from initiation of Xarelto so would still be on BID dosing.   Hgb 9.9- heme positive stool Platelets WNL  PTT 68  Goal of Therapy:  Heparin level 0.3-0.7 units/ml aPTT 66-102 seconds Monitor platelets by anticoagulation protocol: Yes   Plan:  Heparin infusion at 1200 units/hr Check anti-Xa level in 8 hours and daily. Continue to monitor H&H and platelets.   Margot Ables, PharmD Clinical Pharmacist 10/30/2020 9:20 AM

## 2020-10-30 NOTE — Progress Notes (Signed)
ANTICOAGULATION CONSULT NOTE - Follow Up Consult  Pharmacy Consult for heparin Indication: pulmonary embolus and DVT  Allergies  Allergen Reactions   Sulfa Antibiotics Rash    Patient Measurements: Height: 5\' 5"  (165.1 cm) Weight: 88.5 kg (195 lb) IBW/kg (Calculated) : 57 Heparin Dosing Weight: 76 kg   Vital Signs: Temp: 98.7 F (37.1 C) (06/23 2009) Temp Source: Oral (06/23 2009) BP: 132/67 (06/23 2009) Pulse Rate: 88 (06/23 2009)  Labs: Recent Labs    10/29/20 1009 10/29/20 1435 10/29/20 1845 10/29/20 2224 10/30/20 0415 10/30/20 0511 10/30/20 0806 10/30/20 1510 10/30/20 1936  HGB 10.5*  --   --   --   --  9.9*  --   --   --   HCT 30.9*  --   --   --   --  28.5*  --   --   --   PLT 261  --   --   --   --  253  --   --   --   APTT  --  37*  --    < >  --   --  68* 142* 130*  LABPROT  --   --  32.5*  --   --   --  22.5*  --   --   INR  --   --  3.2*  --   --   --  2.0*  --   --   HEPARINUNFRC  --  >1.10*  --   --   --   --   --   --   --   CREATININE 0.81  --   --   --  0.73  --   --   --   --    < > = values in this interval not displayed.    Estimated Creatinine Clearance: 59.6 mL/min (by C-G formula based on SCr of 0.73 mg/dL).   Medications:  Infusions:   cefTRIAXone (ROCEPHIN)  IV 1 g (10/30/20 1757)   heparin 1,200 Units/hr (10/30/20 1200)    Assessment: Pharmacy consulted for heparin dosing for VTE treatment. Recently diagnosed with left leg DVT and PE (10/10/2020) previously on Xarelto (last dose 6/21 @ 2100). CTA shows PE in RLL that has increased despite treatment with Xarelto. Patient on PPI for overt GI bleed.  Repeat aPTT still elevated at 130. Spoke with RN and no oozing and no concerns for inaccurate lab draw.   Goal of Therapy:  Heparin level 0.3-0.7 units/ml aPTT 66-102 seconds Monitor platelets by anticoagulation protocol: Yes   Plan:  Reduce heparin drip by 2 units/kg/hr (150 units/hr) to 1050 units/hr.  Repeat aPTT level in 6-8  hours Continue to monitor H&H and plalets. Check anti-Xa level in AM at 8 hours and daily.  Jordan Hawks Laronda Lisby 10/30/2020,9:04 PM

## 2020-10-30 NOTE — Progress Notes (Signed)
ANTICOAGULATION CONSULT NOTE -   Pharmacy Consult for heparin Indication:  VTE treatment  Allergies  Allergen Reactions   Sulfa Antibiotics Rash    Patient Measurements: Height: 5\' 5"  (165.1 cm) Weight: 88.5 kg (195 lb) IBW/kg (Calculated) : 57 Heparin Dosing Weight: 76 kg  Vital Signs: Temp: 97.8 F (36.6 C) (06/23 1320) Temp Source: Oral (06/23 1320) BP: 131/72 (06/23 1320) Pulse Rate: 78 (06/23 1320)  Labs: Recent Labs    10/29/20 1009 10/29/20 1435 10/29/20 1845 10/29/20 2224 10/30/20 0018 10/30/20 0415 10/30/20 0511 10/30/20 0806 10/30/20 1510  HGB 10.5*  --   --   --   --   --  9.9*  --   --   HCT 30.9*  --   --   --   --   --  28.5*  --   --   PLT 261  --   --   --   --   --  253  --   --   APTT  --  37*  --    < > 83*  --   --  68* 142*  LABPROT  --   --  32.5*  --   --   --   --  22.5*  --   INR  --   --  3.2*  --   --   --   --  2.0*  --   HEPARINUNFRC  --  >1.10*  --   --   --   --   --   --   --   CREATININE 0.81  --   --   --   --  0.73  --   --   --    < > = values in this interval not displayed.     Estimated Creatinine Clearance: 59.6 mL/min (by C-G formula based on SCr of 0.73 mg/dL).   Medical History: Past Medical History:  Diagnosis Date   Arthritis    rheumatoid   Colon cancer ([place]) 03/2000   stage 3 adenocarcinoma   Colon cancer ([place]) 08/20/2015   Coronary artery disease    Diabetes mellitus    DVT (deep venous thrombosis) ([facility])    Heartburn    Hypertension    Obesity    Osteoarthritis    RA (rheumatoid arthritis) ([facility])    Syncope and collapse     Medications:  Medications Prior to Admission  Medication Sig Dispense Refill Last Dose   acetaminophen (TYLENOL) 500 MG tablet Take 1,000 mg by mouth every 6 (six) hours as needed for moderate pain or headache.      amLODipine (NORVASC) 5 MG tablet Take 5 mg by mouth 2 (two) times daily.    10/28/2020   cholecalciferol (VITAMIN D) 25 MCG (1000 UT) tablet Take 2,000 Units by mouth  daily.    10/28/2020   empagliflozin (JARDIANCE) 10 MG TABS tablet Take 1 tablet (10 mg total) by mouth daily before breakfast. 90 tablet 2 08/03/7122   folic acid (FOLVITE) 1 MG tablet Take 1 mg by mouth daily.   10/28/2020   gabapentin (NEURONTIN) 100 MG capsule Take 100 mg by mouth at bedtime.   10/28/2020   hydrochlorothiazide (HYDRODIURIL) 25 MG tablet Take 25 mg by mouth daily.    10/28/2020   HYDROcodone-acetaminophen (NORCO) 10-325 MG tablet Take 1 tablet by mouth every 6 (six) hours as needed.      Insulin Glargine (BASAGLAR KWIKPEN) 100 UNIT/ML Inject 5 Units into the skin at  bedtime.   10/28/2020   isosorbide mononitrate (IMDUR) 60 MG 24 hr tablet Take 1 tablet by mouth once daily 90 tablet 0 10/28/2020   metFORMIN (GLUCOPHAGE) 500 MG tablet Take 500 mg by mouth 2 (two) times daily with a meal.   10/28/2020   metoprolol tartrate (LOPRESSOR) 50 MG tablet Take 1 tablet by mouth twice daily 180 tablet 0 10/28/2020 at 2100   nitroGLYCERIN (NITROSTAT) 0.4 MG SL tablet Place 0.4 mg under the tongue every 5 (five) minutes x 3 doses as needed.   unk at unk   ondansetron (ZOFRAN) 4 MG tablet Take 1 tablet (4 mg total) by mouth every 6 (six) hours as needed for nausea or vomiting. 12 tablet 0    pantoprazole (PROTONIX) 40 MG tablet Take 1 tablet by mouth once daily 90 tablet 0 10/28/2020   potassium chloride SA (KLOR-CON) 20 MEQ tablet Take 1 tablet (20 mEq total) by mouth 2 (two) times daily. 10 tablet 0 10/28/2020   RIVAROXABAN (XARELTO) VTE STARTER PACK (15 & 20 MG) Follow package directions: Take one 15mg  tablet by mouth twice a day. On day 22, switch to one 20mg  tablet once a day. Take with food. 51 each 0 10/29/2020 at 0700   rosuvastatin (CRESTOR) 5 MG tablet Take 1 tablet (5 mg total) by mouth daily. 30 tablet 6 10/28/2020   spironolactone (ALDACTONE) 25 MG tablet Take 0.5 tablets (12.5 mg total) by mouth daily. 60 tablet 0 10/28/2020   cyclobenzaprine (FLEXERIL) 10 MG tablet Take 10 mg by mouth 3  (three) times daily as needed for muscle spasms. (Patient not taking: Reported on 10/29/2020)   Not Taking   levETIRAcetam (KEPPRA) 750 MG tablet Take 750 mg by mouth 2 (two) times daily.      predniSONE (DELTASONE) 5 MG tablet Take 5 mg by mouth daily.  (Patient not taking: Reported on 10/29/2020)   Not Taking    Assessment: Pharmacy consulted to dose heparin in patient for VTE treatment.  Patient recently diagnosed with left leg DVT and PE on 10/10/2020.  CT angio shows pulmonary emboli in RLL which have increased from 6/3. She is on Xarelto prior to admission with last dose 6/21 @ 2100 per patient.  Today would be day 20 from initiation of Xarelto so would still be on BID dosing.   Hgb 9.9- heme positive stool Platelets WNL  PTT 142- likely lab draw error as previous levels were therapeutic  Goal of Therapy:  Heparin level 0.3-0.7 units/ml aPTT 66-102 seconds Monitor platelets by anticoagulation protocol: Yes   Plan:  Redraw APTT Heparin infusion at 1200 units/hr Check anti-Xa level in 8 hours and daily. Continue to monitor H&H and platelets.   Margot Ables, PharmD Clinical Pharmacist 10/30/2020 5:42 PM

## 2020-10-30 NOTE — Progress Notes (Signed)
Subjective: Continues to get worn out and SOB quickly with little exertion. No chest pain. States she just felt very drained while betting a bath. No SOB at rest. Feels tired now. No abdominal pain, nausea, vomiting. No BM since admission. Last BM was day prior to admission. Passing gas. No change in mental status/confusion.   Last colonoscopy 4.5 years ago, due for repeat in December 2022. Reports having a couple of polyps on her last colonoscopy.    Objective: Vital signs in last 24 hours: Temp:  [97.8 F (36.6 C)-98.4 F (36.9 C)] 98.2 F (36.8 C) (06/23 1038) Pulse Rate:  [61-106] 61 (06/23 1038) Resp:  [18-29] 20 (06/23 1038) BP: (109-155)/(60-84) 125/60 (06/23 1038) SpO2:  [94 %-99 %] 98 % (06/23 1038) FiO2 (%):  [21 %] 21 % (06/22 1339) Weight:  [88.5 kg] 88.5 kg (06/23 0500) Last BM Date: 10/29/20 General:   Alert and oriented, pleasant, resting in bed with NAD.  Head:  Normocephalic and atraumatic. Eyes:  No icterus, sclera clear.  Heart:  S1, S2 present, no murmurs noted.  Lungs: Clear to auscultation bilaterally, without wheezing, rales, or rhonchi.  Abdomen:  Bowel sounds present, soft, non-tender, non-distended. No HSM or hernias noted. No rebound or guarding. No masses appreciated  Extremities:  Without edema. Neurologic:  Alert and  oriented x4;  grossly normal neurologically. No asterixis.  Skin:  Warm and dry, intact without significant lesions.  Psych:  Normal mood and affect.  Intake/Output from previous day: 06/22 0701 - 06/23 0700 In: 630.7 [I.V.:114; IV Piggyback:516.7] Out: -  Intake/Output this shift: Total I/O In: 240 [P.O.:240] Out: -   Lab Results: Recent Labs    10/29/20 1009 10/30/20 0511  WBC 17.8* 19.5*  HGB 10.5* 9.9*  HCT 30.9* 28.5*  PLT 261 253   BMET Recent Labs    10/29/20 1009 10/30/20 0415  NA 135 135  K 4.2 4.9  CL 101 102  CO2 23 22  GLUCOSE 186* 126*  BUN 16 16  CREATININE 0.81 0.73  CALCIUM 8.4* 8.0*    LFT Recent Labs    10/29/20 1009 10/29/20 1637 10/30/20 0415  PROT 6.2*  --  5.6*  ALBUMIN 2.8*  --  2.5*  AST 79*  --  90*  ALT 36  --  36  ALKPHOS 221*  --  212*  BILITOT 2.1* 2.0* 1.6*  BILIDIR  --  0.8*  --   IBILI  --  1.2*  --    PT/INR Recent Labs    10/29/20 1845 10/30/20 0806  LABPROT 32.5* 22.5*  INR 3.2* 2.0*    Studies/Results: CT Angio Chest PE W and/or Wo Contrast  Result Date: 10/29/2020 CLINICAL DATA:  Weakness and loss of appetite. Evaluate for pulmonary embolus. EXAM: CT ANGIOGRAPHY CHEST WITH CONTRAST TECHNIQUE: Multidetector CT imaging of the chest was performed using the standard protocol during bolus administration of intravenous contrast. Multiplanar CT image reconstructions and MIPs were obtained to evaluate the vascular anatomy. CONTRAST:  140m OMNIPAQUE IOHEXOL 350 MG/ML SOLN COMPARISON:  10/10/2020. FINDINGS: Cardiovascular: Segmental and subsegmental filling defects in the right lower lobe pulmonary arteries, increased from 10/10/2020. Atherosclerotic calcification of the aorta. Pulmonic trunk and heart are enlarged. No pericardial effusion. Mediastinum/Nodes: Surgical clips in the region of the right thyroid. No pathologically enlarged mediastinal, hilar or axillary lymph nodes. Esophagus is grossly unremarkable. Lungs/Pleura: A few scattered pulmonary nodules measure up to 7 mm in the right upper lobe (6/54), similar to 10/10/2020. Scattered volume  loss in the lung bases. No pleural fluid. Airway is unremarkable. Upper Abdomen: Liver margin is irregular. Visualized portions of the liver, adrenal glands, left kidney, spleen, pancreas, stomach and bowel are otherwise unremarkable. Small ascites in the left upper quadrant. Musculoskeletal: Degenerative changes in the spine. Review of the MIP images confirms the above findings. IMPRESSION: 1. Segmental/subsegmental pulmonary emboli in the right lower lobe, increased from 10/10/2020. Critical Value/emergent  results were called by telephone at the time of interpretation on 10/29/2020 at 12:12 pm to provider JOSEPH ZAMMIT , who verbally acknowledged these results. 2. Scattered pulmonary nodules measure up to 7 mm in the right upper lobe. Non-contrast chest CT at 3-6 months is recommended. If the nodules are stable at time of repeat CT, then future CT at 18-24 months (from today's scan) is considered optional for low-risk patients, but is recommended for high-risk patients. This recommendation follows the consensus statement: Guidelines for Management of Incidental Pulmonary Nodules Detected on CT Images: From the Fleischner Society 2017; Radiology 2017; 284:228-243. 3. Cirrhosis. 4.  Aortic atherosclerosis (ICD10-I70.0). 5. Enlarged pulmonic trunk, indicative of pulmonary arterial hypertension. Electronically Signed   By: Lorin Picket M.D.   On: 10/29/2020 12:12   US Abdomen Complete  Result Date: 10/29/2020 CLINICAL DATA:  Elevated bilirubin, history of prior cholecystectomy EXAM: ABDOMEN ULTRASOUND COMPLETE COMPARISON:  CTA of the chest from 10/29/2020 FINDINGS: Gallbladder: Surgically removed. Common bile duct: Diameter: 4.3 mm. Liver: Mild heterogeneity of the liver is noted without focal mass consistent with the cirrhotic change seen on recent CT examination. Portal vein is patent on color Doppler imaging with normal direction of blood flow towards the liver. IVC: No abnormality visualized. Pancreas: Visualized portion unremarkable. Spleen: Size and appearance within normal limits. Right Kidney: Length: 10.5 cm. Echogenicity within normal limits. No mass or hydronephrosis visualized. Left Kidney: Length: 11.6 cm. Echogenicity within normal limits. No mass or hydronephrosis visualized. Abdominal aorta: No aneurysm visualized. Other findings: None. IMPRESSION: Cirrhotic change of the liver without focal mass. Status post cholecystectomy. No other focal abnormality is noted. Electronically Signed   By: Inez Catalina M.D.   On: 10/29/2020 17:14   ECHOCARDIOGRAM COMPLETE  Result Date: 10/29/2020    ECHOCARDIOGRAM REPORT   Patient Name:   NEELY KAMMERER Date of Exam: 10/29/2020 Medical Rec #:  [id number]         Height:       65.0 in Accession #:    [id number]        Weight:       194.0 lb Date of Birth:  [birth year]          BSA:          1.953 m Patient Age:    80 years          BP:           141/70 mmHg Patient Gender: F                 HR:           90 bpm. Exam Location:  [facility] Procedure: 2D Echo, Cardiac Doppler and Color Doppler Indications:    Pulmonary Embolus I26.09  History:        Patient has prior history of Echocardiogram examinations, most                 recent 02/12/2014. CAD, Arrythmias:non-specific ST changes; Risk  Factors:Diabetes and Hypertension.  Sonographer:    Wenda Low Referring Phys: [place]  1. Left ventricular ejection fraction, by estimation, is 65 to 70%. The left ventricle has normal function. The left ventricle has no regional wall motion abnormalities. Left ventricular diastolic parameters are consistent with Grade I diastolic dysfunction (impaired relaxation).  2. Right ventricular systolic function is normal. The right ventricular size is normal. There is normal pulmonary artery systolic pressure.  3. The mitral valve is normal in structure. No evidence of mitral valve regurgitation. No evidence of mitral stenosis.  4. The aortic valve is tricuspid. Aortic valve regurgitation is not visualized. No aortic stenosis is present.  5. The inferior vena cava is normal in size with greater than 50% respiratory variability, suggesting right atrial pressure of 3 mmHg. FINDINGS  Left Ventricle: Left ventricular ejection fraction, by estimation, is 65 to 70%. The left ventricle has normal function. The left ventricle has no regional wall motion abnormalities. The left ventricular internal cavity size was normal in size. There is  no left  ventricular hypertrophy. Left ventricular diastolic parameters are consistent with Grade I diastolic dysfunction (impaired relaxation). Right Ventricle: The right ventricular size is normal. No increase in right ventricular wall thickness. Right ventricular systolic function is normal. There is normal pulmonary artery systolic pressure. The tricuspid regurgitant velocity is 2.81 m/s, and  with an assumed right atrial pressure of 3 mmHg, the estimated right ventricular systolic pressure is 54.5 mmHg. Left Atrium: Left atrial size was normal in size. Right Atrium: Right atrial size was normal in size. Pericardium: There is no evidence of pericardial effusion. Mitral Valve: The mitral valve is normal in structure. No evidence of mitral valve regurgitation. No evidence of mitral valve stenosis. MV peak gradient, 3.6 mmHg. The mean mitral valve gradient is 1.0 mmHg. Tricuspid Valve: The tricuspid valve is normal in structure. Tricuspid valve regurgitation is mild . No evidence of tricuspid stenosis. Aortic Valve: The aortic valve is tricuspid. Aortic valve regurgitation is not visualized. No aortic stenosis is present. Aortic valve mean gradient measures 3.0 mmHg. Aortic valve peak gradient measures 5.1 mmHg. Aortic valve area, by VTI measures 3.38 cm. Pulmonic Valve: The pulmonic valve was normal in structure. Pulmonic valve regurgitation is not visualized. No evidence of pulmonic stenosis. Aorta: The aortic root is normal in size and structure. Venous: The inferior vena cava is normal in size with greater than 50% respiratory variability, suggesting right atrial pressure of 3 mmHg. IAS/Shunts: No atrial level shunt detected by color flow Doppler.  LEFT VENTRICLE PLAX 2D LVIDd:         4.30 cm  Diastology LVIDs:         2.89 cm  LV e' medial:    7.10 cm/s LV PW:         1.17 cm  LV E/e' medial:  7.6 LV IVS:        0.91 cm  LV e' lateral:   9.11 cm/s LVOT diam:     2.10 cm  LV E/e' lateral: 5.9 LV SV:         67 LV SV  Index:   34 LVOT Area:     3.46 cm  RIGHT VENTRICLE RV S prime:     19.60 cm/s LEFT ATRIUM             Index       RIGHT ATRIUM           Index LA diam:  3.10 cm 1.59 cm/m  RA Area:     10.40 cm LA Vol (A2C):   48.2 ml 24.68 ml/m RA Volume:   17.20 ml  8.81 ml/m LA Vol (A4C):   27.2 ml 13.93 ml/m LA Biplane Vol: 36.8 ml 18.85 ml/m  AORTIC VALVE AV Area (Vmax):    2.89 cm AV Area (Vmean):   3.18 cm AV Area (VTI):     3.38 cm AV Vmax:           113.00 cm/s AV Vmean:          78.000 cm/s AV VTI:            0.199 m AV Peak Grad:      5.1 mmHg AV Mean Grad:      3.0 mmHg LVOT Vmax:         94.20 cm/s LVOT Vmean:        71.700 cm/s LVOT VTI:          0.194 m LVOT/AV VTI ratio: 0.97  AORTA Ao Root diam: 3.20 cm Ao Asc diam:  3.50 cm MITRAL VALVE               TRICUSPID VALVE MV Area (PHT): 2.36 cm    TR Peak grad:   31.6 mmHg MV Area VTI:   3.71 cm    TR Vmax:        281.00 cm/s MV Peak grad:  3.6 mmHg MV Mean grad:  1.0 mmHg    SHUNTS MV Vmax:       0.95 m/s    Systemic VTI:  0.19 m MV Vmean:      46.0 cm/s   Systemic Diam: 2.10 cm MV Decel Time: 321 msec MV E velocity: 53.90 cm/s MV A velocity: 99.80 cm/s MV E/A ratio:  0.54 Jenkins Rouge MD Electronically signed by Jenkins Rouge MD Signature Date/Time: 10/29/2020/4:46:14 PM    Final     Assessment: 80 year old female with history of colon cancer s/p partial colectomy in 2001, CAD, diabetes, rheumatoid arthritis, HTN, recent DVT/PE on 6/3 and started on Xarelto who presented to the emergency room with complaint of fatigue, lack of appetite, and back pain.  She was mildly hypertensive with mild tachypnea in the ED.  Found to have elevated white count of 17.8, low hemoglobin of 10.5, FOBT positive, mildly elevated AST at 79 and alk phos at 221, T bili elevated at 2.1.  CT angio chest with worsening PE with enlarged pulmonary trunk.  CT also suggested cirrhosis.  GI consulted due to anemia with heme positive stool and possible cirrhosis.  Anemia with  heme positive stool and reported melena: Hemoglobin 10.5 on admission with microcytic indices, down from 11.1, 2 weeks ago when she was started on Xarelto for PE.  No evidence of iron deficiency.  Appears baseline hemoglobin is in the upper 11-12 range.  Reports new onset melena since starting Xarelto.  Denies BRBPR. Denies NSAIDs. She does take prednisone intermittently for RA. No prior EGD.  Per patient, due for colonoscopy with Dr. Collene Mares in December this year. No overt GI bleeding since admission, currently on IV PPI twice daily and Rocephin for SBP prophylaxis. Hemoglobin down to 9.9 this morning. She did receive 500 mL NS bolus yesterday, likely component of hemodilution.   With reports of melena, suspect upper GI/small bowel etiology.  Notably, BUN remains normal.  Differentials include gastritis, duodenitis, PUD, AVMs.  Not consistent with variceal bleed.  Ideally, would proceed with EGD for further  evaluation. Will need to discuss with Dr. Gala Romney and anesthesia due to current PE. Hospitalist has expressed concern for discharging patient with need for long term anticoagulation.  Possible cirrhosis: CT chest with reports of cirrhosis. Follow-up US suggestive of cirrhosis as well though spleen and platelets are normal. No history of liver disease. Prior CT renal study in 2019 with normal-appearing liver.  Denies history of alcohol or drug use. No symptoms of hepatic decompensation. On admission, she had elevated AST at 79, alk phos 221, T bili 2.1 (direct bilirubin 0.8, indirect 1.2), INR 3.2.  Previously, LFTs normal.  Prior elevation of T bili in February and June of this year.  Today, AST 90, alk phos 212, T bili 1.6, INR 2.0 (currently on heparin for PE).   Unclear if elevated AST, alk phos, and T bili is secondary to liver etiology versus PE/acute illness as she has no history of elevated LFTs. Query whether she may have component of Gilbert's syndrome as well. Elevated INR likely influenced by Xarelto  and now heparin.  Will check elastography to further evaluate possible cirrhosis.  If she does not fact have cirrhosis, suspect this is likely secondary to fatty liver.  We will screen for for hepatitis A immunity, hepatitis B infection/immunity, and hepatitis C infection.  Will also check GGT to see if elevated alk phos is coming from the liver.   PE with enlarged pulmonary trunk: Diagnosed with PE/DVT on 6/3 and started on Xarelto. Repeat CT with worsening right-sided PE with enlarged pulmonary trunk.  ECHO yesterday with grade 1 diastolic dysfunction, LV EF normal, no evidence of right heart strain, normal pulmonary artery pressure.  She continues to feel very fatigued/short of breath with any activity, but denies shortness of breath at rest.  She is not requiring oxygen.  Currently on heparin.  Constipation: Mild, chronic, intermittent constipation.  Will start MiraLAX daily.  Plan: Continue to monitor H&H and for overt GI bleeding. Continue IV PPI twice daily. Continue Rocephin for SBP prophylaxis. To discuss with Dr. Gala Romney on possibility of EGD as ECHO was essentially normal.  Heparin will need to be held prior to procedure.  [place] Elastography Liver Hepatitis A antibody total, hepatitis B surface antibody, hepatitis B core antibody, hepatitis B surface antigen, hepatitis C core antibody. GGT Follow-up LFTs and INR daily. MiraLAX 17 g daily.  Request colonoscopy records from Dr. Collene Mares. Order placed.      LOS: 0 days    10/30/2020, 11:02 AM   Aliene Altes, [facility]

## 2020-10-30 NOTE — Progress Notes (Signed)
PROGRESS NOTE    Melissa Reynolds  MBW:[id number] DOB: 1940/06/23 DOA: 10/29/2020 PCP: [name], MD    Chief Complaint  Patient presents with   Weakness    Brief Narrative: Melissa Reynolds is a 80 y.o. female with medical history significant of recent pulmonary embolism/left lower extremity DVT; seizure disorder, type 2 diabetes mellitus, hypertension, chronic diastolic heart failure, hyperlipidemia, gastroesophageal reflux disease and history of coronary disease.  Who presented to the hospital secondary to worsening chest discomfort, general malaise and dyspnea on exertion.  Patient reported as since she was diagnosed approximately 2 weeks ago with left lower extremity DVT and pulmonary embolism she has been experiencing general malaise, decreased appetite, intermittent pleuritic chest discomfort and shortness of breath on exertion.  No nausea, no vomiting, no dysuria, no hematuria, no overt bleeding.  Patient denies any fever, chills or sick contacts.   COVID PCR ordered and pending at time of admission; patient is unvaccinated.   ED Course: Overall stable blood work except for transaminitis, positive fecal occult blood test and CT angiogram demonstrating worsening pulmonary embolism findings.  Case discussed with hematology service who recommended admission for heparin drip, 2D echo and evaluation by GI due to elevated LFTs and fecal occult blood test.  TR[facility] has been contacted to place in the hospital to facilitate evaluation.   Review of Systems: As per HPI otherwise all other systems reviewed and are negative.  Assessment & Plan: 1-pleuritic chest discomfort, shortness of breath and general malaise -Repeat CT angiogram demonstrating worsening pulmonary embolism findings with enlargement of pulmonary trunk -2D echo ruling out right heart strain or pulmonary pressure abnormalities. -Continue heparin drip -Continue supportive care  2-positive fecal occult blood testing/acute blood  loss anemia -There is reports concerning for melanotic stools -No overt bleeding since admission -GI service has been consulted and will follow recommendations -Contemplating EGD evaluation. -Continue PPI  3-essential hypertension -Stable and well-controlled -Continue current antihypertensive agents.  4-chronic diastolic heart failure -Grade 1 diastolic dysfunction appreciated on repeat echocardiogram -Preserved ejection fraction and no wall motion abnormality -Continue the use of beta-blocker and spironolactone. -Will continue to follow daily weights, strict I's and O's and low-sodium diet when diet advanced.  5-history of seizure disorder -No seizure activity appreciated -Continue Keppra.  6-hyperlipidemia -Holding statins in the setting of transaminitis.  7-transaminitis -With concerns for cirrhotic changes on CT abdomen and pelvis -Follow GI service recommendation -Mild ascites appreciated -Prophylaxis for SBP with Rocephin has been ordered. -LFTs are trending down on repeat labs.  8-type 2 diabetes mellitus -Continue holding oral hypoglycemic agents while inpatient -Continue sliding scale insulin and Levemir. -follow CBG's    Transaminitis   Acute blood loss anemia (ABLA)      DVT prophylaxis: Heparin drip Code Status: Full code Family Communication: No family at bedside. Disposition:   Status is: Remains inpatient appropriate because:Ongoing diagnostic testing needed not appropriate for outpatient work up  Dispo: The patient is from: Home              Anticipated d/c is to: Home              Patient currently is not medically stable to d/c.   Difficult to place patient No     Consultants:  Gastroenterology service Hematology curbside (Dr. Alvy Bimler)  Procedures:  See below for x-ray report.  Antimicrobials:  Rocephin empirically started for SBP prophylaxis.   Subjective: Afebrile, currently chest pain-free and reporting general malaise and  fatigue.  No overt bleeding overnight.  Objective: Vitals:   10/30/20 0725 10/30/20 1038 10/30/20 1139 10/30/20 1320  BP: 128/63 125/60 122/66 131/72  Pulse: 80 61 61 78  Resp: 18 20 18 18   Temp: 98.4 F (36.9 C) 98.2 F (36.8 C) 98.8 F (37.1 C) 97.8 F (36.6 C)  TempSrc: Oral Oral Oral Oral  SpO2: 94% 98% 95% 98%  Weight:      Height:        Intake/Output Summary (Last 24 hours) at 10/30/2020 1806 Last data filed at 10/30/2020 1000 Gross per 24 hour  Intake 454.03 ml  Output --  Net 454.03 ml   Filed Weights   10/29/20 0935 10/30/20 0500  Weight: 88 kg 88.5 kg    Examination:  General exam: Afebrile; reports no chest pain currently and no shortness of breath at rest.  Still easily short winded with activity and reporting general malaise.  No nausea or vomiting Respiratory system: Clear to auscultation. Respiratory effort normal.  No using accessory muscles. Cardiovascular system: S1 & S2 heard, RRR. No JVD, murmurs, rubs, gallops or clicks.  Gastrointestinal system: Abdomen is mildly obese, nondistended, soft and nontender. No organomegaly or masses felt. Normal bowel sounds heard. Central nervous system: Alert and oriented. No focal neurological deficits. Extremities: No cyanosis or clubbing. Skin: No petechiae. Psychiatry: Judgement and insight appear normal. Mood & affect appropriate.     Data Reviewed: I have personally reviewed following labs and imaging studies  CBC: Recent Labs  Lab 10/29/20 1009 10/30/20 0511  WBC 17.8* 19.5*  NEUTROABS 14.1*  --   HGB 10.5* 9.9*  HCT 30.9* 28.5*  MCV 75.9* 75.8*  PLT 261 580    Basic Metabolic Panel: Recent Labs  Lab 10/29/20 1009 10/30/20 0415  NA 135 135  K 4.2 4.9  CL 101 102  CO2 23 22  GLUCOSE 186* 126*  BUN 16 16  CREATININE 0.81 0.73  CALCIUM 8.4* 8.0*  MG 1.1*  --   PHOS 2.9  --     GFR: Estimated Creatinine Clearance: 59.6 mL/min (by C-G formula based on SCr of 0.73 mg/dL).  Liver  Function Tests: Recent Labs  Lab 10/29/20 1009 10/29/20 1637 10/30/20 0415  AST 79*  --  90*  ALT 36  --  36  ALKPHOS 221*  --  212*  BILITOT 2.1* 2.0* 1.6*  PROT 6.2*  --  5.6*  ALBUMIN 2.8*  --  2.5*    CBG: Recent Labs  Lab 10/29/20 1700 10/29/20 2236 10/30/20 0712 10/30/20 1048 10/30/20 1617  GLUCAP 124* 175* 97 177* 125*     Recent Results (from the past 240 hour(s))  Resp Panel by RT-PCR (Flu A&B, Covid) Nasopharyngeal Swab     Status: None   Collection Time: 10/29/20  7:08 PM   Specimen: Nasopharyngeal Swab; Nasopharyngeal(NP) swabs in vial transport medium  Result Value Ref Range Status   SARS Coronavirus 2 by RT PCR NEGATIVE NEGATIVE Final    Comment: (NOTE) SARS-CoV-2 target nucleic acids are NOT DETECTED.  The SARS-CoV-2 RNA is generally detectable in upper respiratory specimens during the acute phase of infection. The lowest concentration of SARS-CoV-2 viral copies this assay can detect is 138 copies/mL. A negative result does not preclude SARS-Cov-2 infection and should not be used as the sole basis for treatment or other patient management decisions. A negative result may occur with  improper specimen collection/handling, submission of specimen other than nasopharyngeal swab, presence of viral mutation(s) within the areas targeted by this assay, and inadequate number of  viral copies(<138 copies/mL). A negative result must be combined with clinical observations, patient history, and epidemiological information. The expected result is Negative.  Fact Sheet for Patients:  EntrepreneurPulse.com.au  Fact Sheet for Healthcare Providers:  IncredibleEmployment.be  This test is no t yet approved or cleared by the [place] FDA and  has been authorized for detection and/or diagnosis of SARS-CoV-2 by FDA under an Emergency Use Authorization (EUA). This EUA will remain  in effect (meaning this test can be used) for the  duration of the COVID-19 declaration under Section 564(b)(1) of the Act, 21 U.S.C.section 360bbb-3(b)(1), unless the authorization is terminated  or revoked sooner.       Influenza A by PCR NEGATIVE NEGATIVE Final   Influenza B by PCR NEGATIVE NEGATIVE Final    Comment: (NOTE) The Xpert Xpress SARS-CoV-2/FLU/RSV plus assay is intended as an aid in the diagnosis of influenza from Nasopharyngeal swab specimens and should not be used as a sole basis for treatment. Nasal washings and aspirates are unacceptable for Xpert Xpress SARS-CoV-2/FLU/RSV testing.  Fact Sheet for Patients: EntrepreneurPulse.com.au  Fact Sheet for Healthcare Providers: IncredibleEmployment.be  This test is not yet approved or cleared by the [place] FDA and has been authorized for detection and/or diagnosis of SARS-CoV-2 by FDA under an Emergency Use Authorization (EUA). This EUA will remain in effect (meaning this test can be used) for the duration of the COVID-19 declaration under Section 564(b)(1) of the Act, 21 U.S.C. section 360bbb-3(b)(1), unless the authorization is terminated or revoked.  Performed at [facility], [address] [place], [place] [address]      Radiology Studies: CT Angio Chest PE W and/or Wo Contrast  Result Date: 10/29/2020 CLINICAL DATA:  Weakness and loss of appetite. Evaluate for pulmonary embolus. EXAM: CT ANGIOGRAPHY CHEST WITH CONTRAST TECHNIQUE: Multidetector CT imaging of the chest was performed using the standard protocol during bolus administration of intravenous contrast. Multiplanar CT image reconstructions and MIPs were obtained to evaluate the vascular anatomy. CONTRAST:  140mL OMNIPAQUE IOHEXOL 350 MG/ML SOLN COMPARISON:  10/10/2020. FINDINGS: Cardiovascular: Segmental and subsegmental filling defects in the right lower lobe pulmonary arteries, increased from 10/10/2020. Atherosclerotic calcification of the aorta. Pulmonic trunk  and heart are enlarged. No pericardial effusion. Mediastinum/Nodes: Surgical clips in the region of the right thyroid. No pathologically enlarged mediastinal, hilar or axillary lymph nodes. Esophagus is grossly unremarkable. Lungs/Pleura: A few scattered pulmonary nodules measure up to 7 mm in the right upper lobe (6/54), similar to 10/10/2020. Scattered volume loss in the lung bases. No pleural fluid. Airway is unremarkable. Upper Abdomen: Liver margin is irregular. Visualized portions of the liver, adrenal glands, left kidney, spleen, pancreas, stomach and bowel are otherwise unremarkable. Small ascites in the left upper quadrant. Musculoskeletal: Degenerative changes in the spine. Review of the MIP images confirms the above findings. IMPRESSION: 1. Segmental/subsegmental pulmonary emboli in the right lower lobe, increased from 10/10/2020. Critical Value/emergent results were called by telephone at the time of interpretation on 10/29/2020 at 12:12 pm to provider JOSEPH ZAMMIT , who verbally acknowledged these results. 2. Scattered pulmonary nodules measure up to 7 mm in the right upper lobe. Non-contrast chest CT at 3-6 months is recommended. If the nodules are stable at time of repeat CT, then future CT at 18-24 months (from today's scan) is considered optional for low-risk patients, but is recommended for high-risk patients. This recommendation follows the consensus statement: Guidelines for Management of Incidental Pulmonary Nodules Detected on CT Images: From the Fleischner Society 2017;  Radiology 2017; 588:502-774. 3. Cirrhosis. 4.  Aortic atherosclerosis (ICD10-I70.0). 5. Enlarged pulmonic trunk, indicative of pulmonary arterial hypertension. Electronically Signed   By: Lorin Picket M.D.   On: 10/29/2020 12:12   US Abdomen Complete  Result Date: 10/29/2020 CLINICAL DATA:  Elevated bilirubin, history of prior cholecystectomy EXAM: ABDOMEN ULTRASOUND COMPLETE COMPARISON:  CTA of the chest from 10/29/2020  FINDINGS: Gallbladder: Surgically removed. Common bile duct: Diameter: 4.3 mm. Liver: Mild heterogeneity of the liver is noted without focal mass consistent with the cirrhotic change seen on recent CT examination. Portal vein is patent on color Doppler imaging with normal direction of blood flow towards the liver. IVC: No abnormality visualized. Pancreas: Visualized portion unremarkable. Spleen: Size and appearance within normal limits. Right Kidney: Length: 10.5 cm. Echogenicity within normal limits. No mass or hydronephrosis visualized. Left Kidney: Length: 11.6 cm. Echogenicity within normal limits. No mass or hydronephrosis visualized. Abdominal aorta: No aneurysm visualized. Other findings: None. IMPRESSION: Cirrhotic change of the liver without focal mass. Status post cholecystectomy. No other focal abnormality is noted. Electronically Signed   By: Inez Catalina M.D.   On: 10/29/2020 17:14   ECHOCARDIOGRAM COMPLETE  Result Date: 10/29/2020    ECHOCARDIOGRAM REPORT   Patient Name:   JEVAEH SHAMS Date of Exam: 10/29/2020 Medical Rec #:  [id number]         Height:       65.0 in Accession #:    [id number]        Weight:       194.0 lb Date of Birth:  12-07-1940          BSA:          1.953 m Patient Age:    50 years          BP:           141/70 mmHg Patient Gender: F                 HR:           90 bpm. Exam Location:  [facility] Procedure: 2D Echo, Cardiac Doppler and Color Doppler Indications:    Pulmonary Embolus I26.09  History:        Patient has prior history of Echocardiogram examinations, most                 recent 02/12/2014. CAD, Arrythmias:non-specific ST changes; Risk                 Factors:Diabetes and Hypertension.  Sonographer:    Wenda Low Referring Phys: [place]  1. Left ventricular ejection fraction, by estimation, is 65 to 70%. The left ventricle has normal function. The left ventricle has no regional wall motion abnormalities. Left ventricular diastolic  parameters are consistent with Grade I diastolic dysfunction (impaired relaxation).  2. Right ventricular systolic function is normal. The right ventricular size is normal. There is normal pulmonary artery systolic pressure.  3. The mitral valve is normal in structure. No evidence of mitral valve regurgitation. No evidence of mitral stenosis.  4. The aortic valve is tricuspid. Aortic valve regurgitation is not visualized. No aortic stenosis is present.  5. The inferior vena cava is normal in size with greater than 50% respiratory variability, suggesting right atrial pressure of 3 mmHg. FINDINGS  Left Ventricle: Left ventricular ejection fraction, by estimation, is 65 to 70%. The left ventricle has normal function. The left ventricle has no regional wall motion abnormalities. The left ventricular internal  cavity size was normal in size. There is  no left ventricular hypertrophy. Left ventricular diastolic parameters are consistent with Grade I diastolic dysfunction (impaired relaxation). Right Ventricle: The right ventricular size is normal. No increase in right ventricular wall thickness. Right ventricular systolic function is normal. There is normal pulmonary artery systolic pressure. The tricuspid regurgitant velocity is 2.81 m/s, and  with an assumed right atrial pressure of 3 mmHg, the estimated right ventricular systolic pressure is 36.6 mmHg. Left Atrium: Left atrial size was normal in size. Right Atrium: Right atrial size was normal in size. Pericardium: There is no evidence of pericardial effusion. Mitral Valve: The mitral valve is normal in structure. No evidence of mitral valve regurgitation. No evidence of mitral valve stenosis. MV peak gradient, 3.6 mmHg. The mean mitral valve gradient is 1.0 mmHg. Tricuspid Valve: The tricuspid valve is normal in structure. Tricuspid valve regurgitation is mild . No evidence of tricuspid stenosis. Aortic Valve: The aortic valve is tricuspid. Aortic valve regurgitation  is not visualized. No aortic stenosis is present. Aortic valve mean gradient measures 3.0 mmHg. Aortic valve peak gradient measures 5.1 mmHg. Aortic valve area, by VTI measures 3.38 cm. Pulmonic Valve: The pulmonic valve was normal in structure. Pulmonic valve regurgitation is not visualized. No evidence of pulmonic stenosis. Aorta: The aortic root is normal in size and structure. Venous: The inferior vena cava is normal in size with greater than 50% respiratory variability, suggesting right atrial pressure of 3 mmHg. IAS/Shunts: No atrial level shunt detected by color flow Doppler.  LEFT VENTRICLE PLAX 2D LVIDd:         4.30 cm  Diastology LVIDs:         2.89 cm  LV e' medial:    7.10 cm/s LV PW:         1.17 cm  LV E/e' medial:  7.6 LV IVS:        0.91 cm  LV e' lateral:   9.11 cm/s LVOT diam:     2.10 cm  LV E/e' lateral: 5.9 LV SV:         67 LV SV Index:   34 LVOT Area:     3.46 cm  RIGHT VENTRICLE RV S prime:     19.60 cm/s LEFT ATRIUM             Index       RIGHT ATRIUM           Index LA diam:        3.10 cm 1.59 cm/m  RA Area:     10.40 cm LA Vol (A2C):   48.2 ml 24.68 ml/m RA Volume:   17.20 ml  8.81 ml/m LA Vol (A4C):   27.2 ml 13.93 ml/m LA Biplane Vol: 36.8 ml 18.85 ml/m  AORTIC VALVE AV Area (Vmax):    2.89 cm AV Area (Vmean):   3.18 cm AV Area (VTI):     3.38 cm AV Vmax:           113.00 cm/s AV Vmean:          78.000 cm/s AV VTI:            0.199 m AV Peak Grad:      5.1 mmHg AV Mean Grad:      3.0 mmHg LVOT Vmax:         94.20 cm/s LVOT Vmean:        71.700 cm/s LVOT VTI:  0.194 m LVOT/AV VTI ratio: 0.97  AORTA Ao Root diam: 3.20 cm Ao Asc diam:  3.50 cm MITRAL VALVE               TRICUSPID VALVE MV Area (PHT): 2.36 cm    TR Peak grad:   31.6 mmHg MV Area VTI:   3.71 cm    TR Vmax:        281.00 cm/s MV Peak grad:  3.6 mmHg MV Mean grad:  1.0 mmHg    SHUNTS MV Vmax:       0.95 m/s    Systemic VTI:  0.19 m MV Vmean:      46.0 cm/s   Systemic Diam: 2.10 cm MV Decel Time: 321 msec  MV E velocity: 53.90 cm/s MV A velocity: 99.80 cm/s MV E/A ratio:  0.54 Jenkins Rouge MD Electronically signed by Jenkins Rouge MD Signature Date/Time: 10/29/2020/4:46:14 PM    Final    [place] ELASTOGRAPHY LIVER  Result Date: 10/30/2020 CLINICAL DATA:  Early cirrhosis EXAM: US LIVER ELASTOGRAPHY TECHNIQUE: Sonography of the liver was performed. In addition, ultrasound elastography evaluation of the liver was performed. A region of interest was placed within the right lobe of the liver. Following application of a compressive sonographic pulse, tissue compressibility was assessed. Multiple assessments were performed at the selected site. Median tissue compressibility was determined. Previously, hepatic stiffness was assessed by shear wave velocity. Based on recently published Society of Radiologists in Ultrasound consensus article, reporting is now recommended to be performed in the SI units of pressure (kiloPascals) representing hepatic stiffness/elasticity. The obtained result is compared to the published reference standards. (cACLD = compensated Advanced Chronic Liver Disease) COMPARISON:  None. FINDINGS: Liver: No focal lesion identified. Heterogeneous common nodular appearance of the liver with increased parenchymal echogenicity. Portal vein is patent on color Doppler imaging with normal direction of blood flow towards the liver. ULTRASOUND HEPATIC ELASTOGRAPHY Device: Siemens Helix VTQ Patient position: Supine Transducer: 5C1 Number of measurements: 15 Hepatic segment:  8 Median kPa: 9.8 IQR: 1.6 IQR/Median kPa ratio: 0.2 Data quality:  Good Diagnostic category: >9 kPa and ?13 kPa: suggestive of cACLD, but needs further testing The use of hepatic elastography is applicable to patients with viral hepatitis and non-alcoholic fatty liver disease. At this time, there is insufficient data for the referenced cut-off values and use in other causes of liver disease, including alcoholic liver disease. Patients, however, may  be assessed by elastography and serve as their own reference standard/baseline. In patients with non-alcoholic liver disease, the values suggesting compensated advanced chronic liver disease (cACLD) may be lower, and patients may need additional testing with elasticity results of 7-9 kPa. Please note that abnormal hepatic elasticity and shear wave velocities may also be identified in clinical settings other than with hepatic fibrosis, such as: acute hepatitis, elevated right heart and central venous pressures including use of beta blockers, veno-occlusive disease (Budd-Chiari), infiltrative processes such as mastocytosis/amyloidosis/infiltrative tumor/lymphoma, extrahepatic cholestasis, with hyperemia in the post-prandial state, and with liver transplantation. Correlation with patient history, laboratory data, and clinical condition recommended. Diagnostic Categories: < or =5 kPa: high probability of being normal < or =9 kPa: in the absence of other known clinical signs, rules out cACLD >9 kPa and ?13 kPa: suggestive of cACLD, but needs further testing >13 kPa: highly suggestive of cACLD > or =17 kPa: highly suggestive of cACLD with an increased probability of clinically significant portal hypertension IMPRESSION: ULTRASOUND LIVER: 1.  Coarse, nodular hepatic echotexture suggesting cirrhosis. 2.  Hepatic steatosis. ULTRASOUND HEPATIC ELASTOGRAPHY: Median kPa:  9.8 Diagnostic category: >9 kPa and ?13 kPa: suggestive of cACLD, but needs further testing Electronically Signed   By: Eddie Candle M.D.   On: 10/30/2020 13:58     Scheduled Meds:  cholecalciferol  2,000 Units Oral Daily   empagliflozin  10 mg Oral QAC breakfast   folic acid  1 mg Oral Daily   gabapentin  100 mg Oral QHS   insulin aspart  0-15 Units Subcutaneous TID WC   insulin detemir  10 Units Subcutaneous QHS   isosorbide mononitrate  60 mg Oral Daily   levETIRAcetam  500 mg Oral BID   metoprolol tartrate  50 mg Oral BID   pantoprazole  (PROTONIX) IV  40 mg Intravenous Q12H   polyethylene glycol  17 g Oral Daily   predniSONE  5 mg Oral Daily   spironolactone  12.5 mg Oral Daily   Continuous Infusions:  cefTRIAXone (ROCEPHIN)  IV 1 g (10/30/20 1757)   heparin 1,200 Units/hr (10/30/20 1200)     LOS: 0 days    Time spent: 35 minutes   Barton Dubois, MD [facility]   To contact the attending provider between 7A-7P or the covering provider during after hours 7P-7A, please log into the web site [email or web address] and access using universal [place] password for that web site. If you do not have the password, please call the hospital operator.  10/30/2020, 6:06 PM

## 2020-10-31 ENCOUNTER — Inpatient Hospital Stay (HOSPITAL_COMMUNITY): Payer: Medicare Other | Admitting: Certified Registered"

## 2020-10-31 ENCOUNTER — Encounter (HOSPITAL_COMMUNITY)
Admission: EM | Disposition: A | Discharge status: Hospice | Payer: Self-pay | Source: Home / Self Care | Attending: Internal Medicine

## 2020-10-31 HISTORY — PX: ESOPHAGOGASTRODUODENOSCOPY (EGD) WITH PROPOFOL: SHX5813

## 2020-10-31 HISTORY — PX: POLYPECTOMY: SHX5525

## 2020-10-31 LAB — CBC
HCT: 28.5 % — ABNORMAL LOW (ref 36.0–46.0)
Hemoglobin: 9.8 g/dL — ABNORMAL LOW (ref 12.0–15.0)
MCH: 26.3 pg (ref 26.0–34.0)
MCHC: 34.4 g/dL (ref 30.0–36.0)
MCV: 76.4 fL — ABNORMAL LOW (ref 80.0–100.0)
Platelets: 287 10*3/uL (ref 150–400)
RBC: 3.73 MIL/uL — ABNORMAL LOW (ref 3.87–5.11)
RDW: 15.5 % (ref 11.5–15.5)
WBC: 21.1 10*3/uL — ABNORMAL HIGH (ref 4.0–10.5)

## 2020-10-31 LAB — GAMMA GT: GGT: 325 U/L — ABNORMAL HIGH (ref 7–50)

## 2020-10-31 LAB — GLUCOSE, CAPILLARY
Glucose-Capillary: 73 mg/dL (ref 70–99)
Glucose-Capillary: 88 mg/dL (ref 70–99)
Glucose-Capillary: 105 mg/dL — ABNORMAL HIGH (ref 70–99)
Glucose-Capillary: 84 mg/dL (ref 70–99)
Glucose-Capillary: 115 mg/dL — ABNORMAL HIGH (ref 70–99)
Glucose-Capillary: 156 mg/dL — ABNORMAL HIGH (ref 70–99)

## 2020-10-31 LAB — HEPARIN LEVEL (UNFRACTIONATED): Heparin Unfractionated: >1.1 IU/mL — ABNORMAL HIGH (ref 0.30–0.70)

## 2020-10-31 LAB — APTT: aPTT: 132 seconds — ABNORMAL HIGH (ref 24–36)

## 2020-10-31 SURGERY — ESOPHAGOGASTRODUODENOSCOPY (EGD) WITH PROPOFOL
Anesthesia: General

## 2020-10-31 MED ORDER — SODIUM CHLORIDE 0.9 % IV SOLN: INTRAVENOUS | Status: DC

## 2020-10-31 MED ORDER — PROPOFOL 10 MG/ML IV BOLUS
INTRAVENOUS | Status: DC | PRN
  Administered 2020-10-31: 125 ug/kg/min via INTRAVENOUS
  Administered 2020-10-31: 80 mg via INTRAVENOUS

## 2020-10-31 MED ORDER — STERILE WATER FOR IRRIGATION IR SOLN
Status: DC | PRN
  Administered 2020-10-31: 100 mL

## 2020-10-31 MED ORDER — LACTATED RINGERS IV SOLN
INTRAVENOUS | Status: DC
  Administered 2020-10-31: 1000 mL via INTRAVENOUS

## 2020-10-31 MED ORDER — SODIUM CHLORIDE FLUSH 0.9 % IV SOLN
INTRAVENOUS | Status: AC
  Filled 2020-10-31: qty 10

## 2020-10-31 MED ORDER — LIDOCAINE HCL (CARDIAC) PF 100 MG/5ML IV SOSY
PREFILLED_SYRINGE | INTRAVENOUS | Status: DC | PRN
  Administered 2020-10-31: 100 mg via INTRAVENOUS

## 2020-10-31 NOTE — Progress Notes (Addendum)
Patient seen briefly this morning in preparation for diagnostic EGD due to anemia, heme positive stool, melena in setting of anticoagulation for PE (diagnosed 6/3). Enlarging PE in right lower lobe documented this admission on 6/22. She is without any shortness of breath, chest pain, melena. Hgb stable at 9.8 (9.9 yesterday and 10.5 on admission). US abdomen with cirrhosis and elastography with elevated kPa score (9.8). Platelets 287.    Discussed case with Dr. Charna Elizabeth, who is aware. Heparin will need to be held 4 hours prior. Discussed risks and benefits with patient who stated understanding and is ready to proceed. Right now, procedure is scheduled for 4pm. I called Janeece Riggers, RN in Endo to inform as well. If for any reason her case is moved, discontinuation of heparin will be affected as well. I have asked RN to call me and floor RN with any changes in endo scheduling, as heparin will need to be stopped 4 hours prior.   Annitta Needs, PhD, ANP-BC [facility]    Addendum: procedure scheduled for approximately 1520 due to scheduling needs. I have communicated with nursing staff to stop Heparin 4 hours prior.  Annitta Needs, PhD, ANP-BC [facility]    Attending note: Patient seen and examined in the endoscopy suite.  Discussed with anesthesia.  Denies dysphagia. Plan for EGD today.  Risk benefits limitations again reviewed.  Heparin has been held per plan.  Further recommendations to follow.

## 2020-10-31 NOTE — Progress Notes (Signed)
PT is laying comfortable in bed but states having hunger with NPO restrictions. Pt seems  tired. Family in room having conversation.

## 2020-10-31 NOTE — TOC Initial Note (Signed)
Transition of Care [facility]) - Initial/Assessment Note    Patient Details  Name: Melissa Reynolds MRN: [id number] Date of Birth: Jan 31, 1941  Transition of Care [facility]) CM/SW Contact:    Ihor Gully, LCSW Phone Number: 10/31/2020, 4:17 PM  Clinical Narrative:                 Patient from home where she lives with her sister. Considered high risk for readmission. At baseline she ambulates independently. For the past 1-2 weeks she ambulated with a cane. Has a walker in the home. Wants a rollator. PT recommends HH. Patient agreeable. HH choices provided. Referral made [facility] at [facility].   Expected Discharge Plan: [place] Barriers to Discharge: Continued Medical Work up   Patient Goals and CMS Choice        Expected Discharge Plan and Services Expected Discharge Plan: [place]       Living arrangements for the past 2 months: Single Family Home                           HH Arranged: PT HH Agency: [place] ([place]) Date HH Agency Contacted: 10/31/20 Time HH Agency Contacted: 57 Representative spoke with at [place]: Vaughan Basta  Prior Living Arrangements/Services Living arrangements for the past 2 months: [place] Lives with:: Siblings Patient language and need for interpreter reviewed:: No Do you feel safe going back to the place where you live?: Yes      Need for Family Participation in Patient Care: Yes (Comment) Care giver support system in place?: Yes (comment) Current home services: DME (walker) Criminal Activity/Legal Involvement Pertinent to Current Situation/Hospitalization: No - Comment as needed  Activities of Daily Living Home Assistive Devices/Equipment: Dentures (specify type), Eyeglasses, Cane (specify quad or straight) ADL Screening (condition at time of admission) Patient's cognitive ability adequate to safely complete daily activities?: Yes Is the patient deaf or have difficulty hearing?: No Does  the patient have difficulty seeing, even when wearing glasses/contacts?: No Does the patient have difficulty concentrating, remembering, or making decisions?: No Patient able to express need for assistance with ADLs?: No Does the patient have difficulty dressing or bathing?: No Independently performs ADLs?: Yes (appropriate for developmental age) Does the patient have difficulty walking or climbing stairs?: No Weakness of Legs: None Weakness of Arms/Hands: None  Permission Sought/Granted                  Emotional Assessment Appearance:: Appears stated age   Affect (typically observed): Appropriate Orientation: : Oriented to Self, Oriented to Place, Oriented to  Time, Oriented to Situation Alcohol / Substance Use: Not Applicable Psych Involvement: No (comment)  Admission diagnosis:  Transaminitis [R74.01] Elevated bilirubin [R17] Early cirrhosis ([place]) [K74.60] 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, unspecified pulmonary embolism type ([place]) [I27.82, I26.09] Acute blood loss anemia (ABLA) [D62] Patient Active Problem List   Diagnosis Date Noted   Acute blood loss anemia (ABLA) 10/30/2020   Transaminitis 10/29/2020   Early cirrhosis (HCC)    Elevated bilirubin    Anemia    Heme positive stool    Uncontrolled type 2 diabetes mellitus with hyperglycemia ([place]) 05/22/2020   Fatigue 03/01/2019   Stable angina ([place]) 10/17/2018   Coronary artery disease of native artery of native heart with stable angina pectoris ([place]) 09/14/2018   Essential hypertension 09/14/2018   Heartburn    RA (rheumatoid arthritis) ([place])  Osteoarthritis    Abnormal stress test 04/06/2017   Colon cancer ([place]) 08/20/2015   Faintness 04/24/2015   PCP:  [name], MD Pharmacy:   [facility] [address] [place] [id number] [place] [address] Phone: [phone] Fax: [phone]     Social Determinants of Health (SDOH) Interventions     Readmission Risk Interventions No flowsheet data found.

## 2020-10-31 NOTE — Plan of Care (Signed)
  Problem: Acute Rehab PT Goals(only PT should resolve) Goal: Patient Will Transfer Sit To/From Stand Outcome: Progressing Flowsheets (Taken 10/31/2020 1110) Patient will transfer sit to/from stand: with modified independence Goal: Pt Will Transfer Bed To Chair/Chair To Bed Outcome: Progressing Flowsheets (Taken 10/31/2020 1110) Pt will Transfer Bed to Chair/Chair to Bed: with modified independence Goal: Pt Will Ambulate Outcome: Progressing Flowsheets (Taken 10/31/2020 1110) Pt will Ambulate:  50 feet  with least restrictive assistive device Goal: Pt/caregiver will Perform Home Exercise Program Outcome: Progressing Flowsheets (Taken 10/31/2020 1110) Pt/caregiver will Perform Home Exercise Program:  For increased strengthening  For improved balance  Independently  11:10 AM, 10/31/20 Mearl Latin PT, DPT Physical Therapist at [facility]

## 2020-10-31 NOTE — Anesthesia Preprocedure Evaluation (Addendum)
Anesthesia Evaluation  Patient identified by MRN, date of birth, ID band Patient awake    Reviewed: Allergy & Precautions, NPO status , Patient's Chart, lab work & pertinent test results, reviewed documented beta blocker date and time   Airway Mallampati: II  TM Distance: >3 FB Neck ROM: Full    Dental  (+) Dental Advisory Given   Pulmonary shortness of breath and with exertion, PE   Pulmonary exam normal breath sounds clear to auscultation       Cardiovascular Exercise Tolerance: Poor hypertension, Pt. on medications and Pt. on home beta blockers + angina + CAD and + DVT  Normal cardiovascular exam Rhythm:Regular Rate:Normal  1. Left ventricular ejection fraction, by estimation, is 65 to 70%. The left ventricle has normal function. The left ventricle has no regional wall motion abnormalities. Left ventricular diastolic parameters are consistent with Grade I diastolic dysfunction (impaired relaxation).  2. Right ventricular systolic function is normal. The right ventricular size is normal. There is normal pulmonary artery systolic pressure.  3. The mitral valve is normal in structure. No evidence of mitral valve regurgitation. No evidence of mitral stenosis.  4. The aortic valve is tricuspid. Aortic valve regurgitation is not visualized. No aortic stenosis is present.  5. The inferior vena cava is normal in size with greater than 50% respiratory variability, suggesting right atrial pressure of 3 mmHg.    Neuro/Psych negative neurological ROS  negative psych ROS   GI/Hepatic Bowel prep,(+) Cirrhosis       , Colon cancer   Endo/Other  diabetes, Well Controlled, Type 2, Oral Hypoglycemic Agents, Insulin Dependent  Renal/GU      Musculoskeletal  (+) Arthritis , Rheumatoid disorders,    Abdominal   Peds  Hematology  (+) anemia ,   Anesthesia Other Findings Colon cancer  Reproductive/Obstetrics                             Anesthesia Physical Anesthesia Plan  ASA: 4  Anesthesia Plan: General   Post-op Pain Management:    Induction: Intravenous  PONV Risk Score and Plan: Propofol infusion  Airway Management Planned: Nasal Cannula and Natural Airway  Additional Equipment:   Intra-op Plan:   Post-operative Plan:   Informed Consent: I have reviewed the patients History and Physical, chart, labs and discussed the procedure including the risks, benefits and alternatives for the proposed anesthesia with the patient or authorized representative who has indicated his/her understanding and acceptance.     Dental advisory given  Plan Discussed with: Surgeon  Anesthesia Plan Comments:         Anesthesia Quick Evaluation

## 2020-10-31 NOTE — Transfer of Care (Signed)
Immediate Anesthesia Transfer of Care Note  Patient: DEYANA WNUK  Procedure(s) Performed: ESOPHAGOGASTRODUODENOSCOPY (EGD) WITH PROPOFOL POLYPECTOMY  Patient Location: PACU  Anesthesia Type:General  Level of Consciousness: awake, alert , oriented and drowsy  Airway & Oxygen Therapy: Patient Spontanous Breathing  Post-op Assessment: Report given to RN, Post -op Vital signs reviewed and stable and Patient moving all extremities  Post vital signs: Reviewed and stable  Last Vitals:  Vitals Value Taken Time  BP 116/56 10/31/20 1418  Temp    Pulse 83 10/31/20 1419  Resp 26 10/31/20 1419  SpO2 96 % 10/31/20 1419  Vitals shown include unvalidated device data.  Last Pain:  Vitals:   10/31/20 1304  TempSrc: Oral  PainSc: 7       Patients Stated Pain Goal: 6 (41/63/84 5364)  Complications: No notable events documented.

## 2020-10-31 NOTE — Progress Notes (Signed)
ANTICOAGULATION CONSULT NOTE - Follow Up Consult  Pharmacy Consult for heparin Indication:  PE/DVT   Labs: Recent Labs    10/29/20 1009 10/29/20 1435 10/29/20 1845 10/29/20 2224 10/30/20 0415 10/30/20 0511 10/30/20 0806 10/30/20 1510 10/30/20 1936 10/31/20 0519 10/31/20 0742  HGB 10.5*  --   --   --   --  9.9*  --   --   --   --  9.8*  HCT 30.9*  --   --   --   --  28.5*  --   --   --   --  28.5*  PLT 261  --   --   --   --  253  --   --   --   --  287  APTT  --  37*  --    < >  --   --  68* 142* 130* 132*  --   LABPROT  --   --  32.5*  --   --   --  22.5*  --   --   --   --   INR  --   --  3.2*  --   --   --  2.0*  --   --   --   --   HEPARINUNFRC  --  >1.10*  --   --   --   --   --   --   --  >1.10*  --   CREATININE 0.81  --   --   --  0.73  --   --   --   --   --   --    < > = values in this interval not displayed.     Assessment: Pharmacy consulted to dose heparin in patient for VTE treatment.  Patient recently diagnosed with left leg DVT and PE on 10/10/2020.  CT angio shows pulmonary emboli in RLL which have increased from 6/3. She is on Xarelto prior to admission with last dose 6/21 @ 2100 per patient.  Today would be day 20 from initiation of Xarelto so would still be on BID dosing.  Hgb 9.9- heme positive stool Platelets WNL 6/24 Heparin turned off at 0923 for ENDO procedure at 1400 and restarted at 1504 per GI recommendations to restart.   Goal of Therapy:  aPTT 66-102 seconds   Plan:  Heparin restarted at 800 units/hr Check APTT in ~8 hours and APTT anti-Xa level daily. Continue to monitor H&H and platelets. F/U restart of xarelto  Isac Sarna, BS Vena Austria, BCPS Clinical Pharmacist Pager [phone] 10/31/2020,3:50 PM

## 2020-10-31 NOTE — Progress Notes (Signed)
ANTICOAGULATION CONSULT NOTE - Follow Up Consult  Pharmacy Consult for heparin Indication:  PE/DVT   Labs: Recent Labs    10/29/20 1009 10/29/20 1435 10/29/20 1845 10/29/20 2224 10/30/20 0415 10/30/20 0511 10/30/20 0806 10/30/20 1510 10/30/20 1936 10/31/20 0519  HGB 10.5*  --   --   --   --  9.9*  --   --   --   --   HCT 30.9*  --   --   --   --  28.5*  --   --   --   --   PLT 261  --   --   --   --  253  --   --   --   --   APTT  --  37*  --    < >  --   --  68* 142* 130* 132*  LABPROT  --   --  32.5*  --   --   --  22.5*  --   --   --   INR  --   --  3.2*  --   --   --  2.0*  --   --   --   HEPARINUNFRC  --  >1.10*  --   --   --   --   --   --   --  >1.10*  CREATININE 0.81  --   --   --  0.73  --   --   --   --   --    < > = values in this interval not displayed.    Assessment: 80yo female remains supratherapeutic on heparin with no change in PTT despite decreased rate; no gtt issues or signs of bleeding per RN.  Goal of Therapy:  aPTT 66-102 seconds   Plan:  Will decrease heparin gtt by 3-4 units/kg/hr to 800 units/hr and check PTT in 8 hours.    Wynona Neat, PharmD, BCPS  10/31/2020,7:11 AM

## 2020-10-31 NOTE — Evaluation (Signed)
Physical Therapy Evaluation Patient Details Name: Melissa Reynolds MRN: [id number] DOB: August 15, 1940 Today's Date: 10/31/2020   History of Present Illness  Melissa Reynolds is a 80 y.o. female with medical history significant of recent pulmonary embolism/left lower extremity DVT; seizure disorder, type 2 diabetes mellitus, hypertension, chronic diastolic heart failure, hyperlipidemia, gastroesophageal reflux disease and history of coronary disease.  Who presented to the hospital secondary to worsening chest discomfort, general malaise and dyspnea on exertion.  Patient reported as since she was diagnosed approximately 2 weeks ago with left lower extremity DVT and pulmonary embolism she has been experiencing general malaise, decreased appetite, intermittent pleuritic chest discomfort and shortness of breath on exertion.  No nausea, no vomiting, no dysuria, no hematuria, no overt bleeding.  Patient denies any fever, chills or sick contacts.   Clinical Impression  Patient limited for functional mobility as stated below secondary to BLE weakness, fatigue and impaired standing balance. Patient does not require assist for bed mobility. Patient demonstrates good sitting balance and sitting tolerance EOB. Patient transfers to standing without physical assist but with labored movements with RW. Patient initially limited to about 30 seconds standing and requires seated rest. Patient again transfers to standing with RW and ambulates in room with RW with slow, labored cadence without loss of balance. Patient attempts sitting in chair at end of session but is fatigued and is assisted back to bed.  Patient will benefit from continued physical therapy in hospital and recommended venue below to increase strength, balance, endurance for safe ADLs and gait.     Follow Up Recommendations Home health PT;Supervision for mobility/OOB    Equipment Recommendations  None recommended by PT    Recommendations for Other  Services       Precautions / Restrictions Precautions Precautions: Fall Restrictions Weight Bearing Restrictions: No      Mobility  Bed Mobility Overal bed mobility: Modified Independent                  Transfers Overall transfer level: Needs assistance Equipment used: Rolling walker (2 wheeled) Transfers: Sit to/from [facility] Sit to Stand: Min guard Stand pivot transfers: Min guard       General transfer comment: labored transfer to standing with RW  Ambulation/Gait Ambulation/Gait assistance: Min guard Gait Distance (Feet): 15 Feet Assistive device: Rolling walker (2 wheeled) Gait Pattern/deviations: Decreased stride length Gait velocity: decreased   General Gait Details: slow, labored cadence with RW  Stairs            Wheelchair Mobility    Modified Rankin (Stroke Patients Only)       Balance Overall balance assessment: Needs assistance Sitting-balance support: No upper extremity supported Sitting balance-Leahy Scale: Normal Sitting balance - Comments: seated EOB     Standing balance-Leahy Scale: Fair Standing balance comment: fair/good with RW                             Pertinent Vitals/Pain Pain Assessment: No/denies pain    Home Living Family/patient expects to be discharged to:: Private residence Living Arrangements: Other relatives (Sister) Available Help at Discharge: Family Type of Home: House Home Access: Ramped entrance     Home Layout: Two level;Able to live on main level with bedroom/bathroom Home Equipment: Gilford Rile - 2 wheels;Cane - single point      Prior Function Level of Independence: Independent         Comments: Patient states community ambulator without  AD, independent with ADL up until last 3 weeks.     Hand Dominance        Extremity/Trunk Assessment   Upper Extremity Assessment Upper Extremity Assessment: Generalized weakness;Overall [facility] for tasks assessed     Lower Extremity Assessment Lower Extremity Assessment: Generalized weakness;Overall [facility] for tasks assessed    Cervical / Trunk Assessment Cervical / Trunk Assessment: Normal  Communication   Communication: No difficulties  Cognition Arousal/Alertness: Awake/alert Behavior During Therapy: WFL for tasks assessed/performed Overall Cognitive Status: Within Functional Limits for tasks assessed                                        General Comments      Exercises     Assessment/Plan    PT Assessment Patient needs continued PT services  PT Problem List Decreased strength;Decreased mobility;Decreased activity tolerance;Decreased balance       PT Treatment Interventions DME instruction;Therapeutic exercise;Gait training;Balance training;Stair training;Neuromuscular re-education;Functional mobility training;Therapeutic activities;Patient/family education    PT Goals (Current goals can be found in the Care Plan section)  Acute Rehab PT Goals Patient Stated Goal: Return home PT Goal Formulation: With patient Time For Goal Achievement: 11/14/20 Potential to Achieve Goals: Good    Frequency Min 3X/week   Barriers to discharge        Co-evaluation               AM-PAC PT "6 Clicks" Mobility  Outcome Measure Help needed turning from your back to your side while in a flat bed without using bedrails?: None Help needed moving from lying on your back to sitting on the side of a flat bed without using bedrails?: A Little Help needed moving to and from a bed to a chair (including a wheelchair)?: A Little Help needed standing up from a chair using your arms (e.g., wheelchair or bedside chair)?: A Little Help needed to walk in hospital room?: A Little Help needed climbing 3-5 steps with a railing? : A Little 6 Click Score: 19    End of Session Equipment Utilized During Treatment: Gait belt Activity Tolerance: Patient tolerated treatment well;Patient limited  by fatigue Patient left: in bed;with call bell/phone within reach Nurse Communication: Mobility status PT Visit Diagnosis: Unsteadiness on feet (R26.81);Other abnormalities of gait and mobility (R26.89);Muscle weakness (generalized) (M62.81)    Time: 6734-1937 PT Time Calculation (min) (ACUTE ONLY): 15 min   Charges:   PT Evaluation $PT Eval Low Complexity: 1 Low         11:09 AM, 10/31/20 Mearl Latin PT, DPT Physical Therapist at [facility]

## 2020-10-31 NOTE — Op Note (Addendum)
[facility] Patient Name: Melissa Reynolds Procedure Date: 10/31/2020 1:37 PM MRN: [id number] Date of Birth: Jan 19, 1941 Attending MD: Norvel Richards , MD CSN: [id number] Age: 80 Admit Type: Inpatient Procedure:                Upper GI endoscopy Indications:              [place] Providers:                Norvel Richards, MD, Lambert Mody, Nelma Rothman, Technician Referring MD:              Medicines:                Propofol per Anesthesia Complications:            No immediate complications. Estimated Blood Loss:     Estimated blood loss was minimal. Procedure:                Pre-Anesthesia Assessment:                           - Prior to the procedure, a History and Physical                            was performed, and patient medications and                            allergies were reviewed. The patient's tolerance of                            previous anesthesia was also reviewed. The risks                            and benefits of the procedure and the sedation                            options and risks were discussed with the patient.                            All questions were answered, and informed consent                            was obtained. Prior Anticoagulants: The patient                            last took heparin on the day of the procedure. ASA                            Grade Assessment: III - A patient with severe                            systemic disease. After reviewing the risks and  benefits, the patient was deemed in satisfactory                            condition to undergo the procedure.                           After obtaining informed consent, the endoscope was                            passed under direct vision. Throughout the                            procedure, the patient's blood pressure, pulse, and                            oxygen saturations were monitored  continuously. The                            [id number]) was introduced through the mouth,                            and advanced to the third part of duodenum. The                            upper GI endoscopy was accomplished without                            difficulty. The patient tolerated the procedure                            well. Scope In: 2:00:08 PM Scope Out: 2:12:03 PM Total Procedure Duration: 0 hours 11 minutes 55 seconds  Findings:      Single 5 cm in length, very skinny, grade 1 varix distal esophagus. No       bleeding stigmata overlying esophageal mucosa appeared normal.      One 5 mm pedunculated gastric polyp with no bleeding and no stigmata of       recent bleeding was found in the gastric body. The polyp was removed       with a cold biopsy forceps. Resection and retrieval were complete.       Estimated blood loss was minimal.      The remainder of the gastric mucosa appeared normal. Patent pylorus.      The duodenal bulb, second portion of the duodenum and third portion of       the duodenum were normal. Biopsies were taken with a cold forceps for       histology. 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                           No blood or bleeding lesions seen anywhere in the                            upper GI tract. Moderate Sedation:      Moderate (conscious) sedation was personally administered by an       anesthesia professional. The following  parameters were monitored: oxygen       saturation, heart rate, blood pressure, and response to care. Recommendation:           - Return patient to hospital ward for ongoing care.                            Resume heparin infusion immediately. Clear liquid                            diet. Resume Xarelto tomorrow. Trend H&H. Follow-up                            on pathology.                           - Early interval follow-up with Dr. Collene Mares, patient's                            primary gastroenterologist, for further evaluation                            as she deems appropriate in the next 1 to 2 weeks.                            Would hold off on initiating nonselective                            beta-blocker therapy at this time.                           - At patient request I called her sisters at                            [phone] but got no answer.  Addendum: I visited patient in her room this                            evening (1740). She is doing well post endoscopy. I                            reviewed findings and recommendations with patient                            and her sisters. 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 All rights reserved. The codes documented in this report are preliminary and upon coder review may  be revised to meet current compliance requirements. [name]. [name], MD Norvel Richards, MD 10/31/2020 2:30:26 PM This report has been signed electronically. Number of Addenda: 0

## 2020-10-31 NOTE — Progress Notes (Signed)
PROGRESS NOTE    Melissa Reynolds  YQI:[id number] DOB: [birth year] DOA: 10/29/2020 PCP: [name], MD    Chief Complaint  Patient presents with   Weakness    Brief Narrative: Melissa Reynolds is a 80 y.o. female with medical history significant of recent pulmonary embolism/left lower extremity DVT; seizure disorder, type 2 diabetes mellitus, hypertension, chronic diastolic heart failure, hyperlipidemia, gastroesophageal reflux disease and history of coronary disease.  Who presented to the hospital secondary to worsening chest discomfort, general malaise and dyspnea on exertion.  Patient reported as since she was diagnosed approximately 2 weeks ago with left lower extremity DVT and pulmonary embolism she has been experiencing general malaise, decreased appetite, intermittent pleuritic chest discomfort and shortness of breath on exertion.  No nausea, no vomiting, no dysuria, no hematuria, no overt bleeding.  Patient denies any fever, chills or sick contacts.   COVID PCR ordered and pending at time of admission; patient is unvaccinated.   ED Course: Overall stable blood work except for transaminitis, positive fecal occult blood test and CT angiogram demonstrating worsening pulmonary embolism findings.  Case discussed with hematology service who recommended admission for heparin drip, 2D echo and evaluation by GI due to elevated LFTs and fecal occult blood test.  [facility] has been contacted to place in the hospital to facilitate evaluation.   Review of Systems: As per HPI otherwise all other systems reviewed and are negative.  Assessment & Plan: 1-pleuritic chest discomfort, shortness of breath and general malaise -Repeat CT angiogram demonstrating worsening pulmonary embolism findings with enlargement of pulmonary trunk -2D echo ruling out right heart strain or pulmonary pressure abnormalities. -Continue heparin drip, after endoscopic evaluation completed. -Continue supportive care  2-positive  fecal occult blood testing/acute blood loss anemia -There is reports concerning for melanotic stools -No overt bleeding since admission; hemoglobin reasonably stable.  No transfusion needed at this time. -Planning for endoscopy later today -Continue n.p.o. status for now. -Continue PPI and follow further GI service recommendations.  3-essential hypertension -Blood pressure is stable and well-controlled -Continue current antihypertensive agents.  4-chronic diastolic heart failure -Grade 1 diastolic dysfunction appreciated on repeat echocardiogram. -Preserved ejection fraction and no wall motion abnormality -Continue the use of beta-blocker and spironolactone. -Will continue to follow daily weights, strict I's and O's and low-sodium diet when diet advanced.  5-history of seizure disorder -No seizure activity appreciated -Continue Keppra.  6-hyperlipidemia -Continue to hold statins in the setting of transaminitis.  7-transaminitis -With concerns for cirrhotic changes on CT abdomen and pelvis -Follow GI service recommendation -Mild ascites appreciated -Prophylaxis for SBP with Rocephin has been ordered. -LFTs are trending down on repeat labs.  8-type 2 diabetes mellitus -Continue holding oral hypoglycemic agents while inpatient -Continue sliding scale insulin and Levemir. -Continue to follow CBG's   DVT prophylaxis: Heparin drip Code Status: Full code Family Communication: No family at bedside. Disposition:   Status is: Remains inpatient appropriate because:Ongoing diagnostic testing needed not appropriate for outpatient work up  Dispo: The patient is from: Home              Anticipated d/c is to: Home              Patient currently is not medically stable to d/c.   Difficult to place patient No     Consultants:  Gastroenterology service Hematology curbside (Dr. Alvy Bimler)  Procedures:  See below for x-ray report.  Antimicrobials:  Rocephin empirically started for  SBP prophylaxis.   Subjective: Patient reports to be  hungry; no chest pain and no shortness of breath at rest.  Good oxygen saturation on room air.  Denies palpitation.  Objective: Vitals:   10/30/20 2009 10/31/20 0602 10/31/20 1304 10/31/20 1418  BP: 132/67 (!) 105/51 (!) 144/70 (!) 116/56  Pulse: 88 85 78 84  Resp: 18 19 16  (!) 26  Temp: 98.7 F (37.1 C) 98.6 F (37 C) 98.4 F (36.9 C) 98.4 F (36.9 C)  TempSrc: Oral Oral Oral   SpO2: 97% 94% 96% 97%  Weight:   89.8 kg   Height:   5\' 5"  (1.651 m)     Intake/Output Summary (Last 24 hours) at 10/31/2020 1443 Last data filed at 10/31/2020 1413 Gross per 24 hour  Intake 880 ml  Output --  Net 880 ml   Filed Weights   10/29/20 0935 10/30/20 0500 10/31/20 1304  Weight: 88 kg 88.5 kg 89.8 kg    Examination: General exam: Alert, awake, oriented x 3; reports no chest pain and no shortness of breath at rest.  No nausea, no vomiting, reports to be hungry. Respiratory system: Clear to auscultation. Respiratory effort normal.  No requiring oxygen supplementation. Cardiovascular system:RRR. No murmurs, rubs, gallops.  No JVD. Gastrointestinal system: Abdomen is nondistended, soft and nontender. No organomegaly or masses felt. Normal bowel sounds heard. Central nervous system: Alert and oriented. No focal neurological deficits. Extremities: No cyanosis or clubbing. Skin: No petechiae. Psychiatry: Judgement and insight appear normal. Mood & affect appropriate.    Data Reviewed: I have personally reviewed following labs and imaging studies  CBC: Recent Labs  Lab 10/29/20 1009 10/30/20 0511 10/31/20 0742  WBC 17.8* 19.5* 21.1*  NEUTROABS 14.1*  --   --   HGB 10.5* 9.9* 9.8*  HCT 30.9* 28.5* 28.5*  MCV 75.9* 75.8* 76.4*  PLT 261 253 401    Basic Metabolic Panel: Recent Labs  Lab 10/29/20 1009 10/30/20 0415  NA 135 135  K 4.2 4.9  CL 101 102  CO2 23 22  GLUCOSE 186* 126*  BUN 16 16  CREATININE 0.81 0.73  CALCIUM  8.4* 8.0*  MG 1.1*  --   PHOS 2.9  --     GFR: Estimated Creatinine Clearance: 60 mL/min (by C-G formula based on SCr of 0.73 mg/dL).  Liver Function Tests: Recent Labs  Lab 10/29/20 1009 10/29/20 1637 10/30/20 0415  AST 79*  --  90*  ALT 36  --  36  ALKPHOS 221*  --  212*  BILITOT 2.1* 2.0* 1.6*  PROT 6.2*  --  5.6*  ALBUMIN 2.8*  --  2.5*    CBG: Recent Labs  Lab 10/30/20 2006 10/31/20 0818 10/31/20 1144 10/31/20 1336 10/31/20 1424  GLUCAP 139* 73 88 84 105*     Recent Results (from the past 240 hour(s))  Resp Panel by RT-PCR (Flu A&B, Covid) Nasopharyngeal Swab     Status: None   Collection Time: 10/29/20  7:08 PM   Specimen: Nasopharyngeal Swab; Nasopharyngeal(NP) swabs in vial transport medium  Result Value Ref Range Status   SARS Coronavirus 2 by RT PCR NEGATIVE NEGATIVE Final    Comment: (NOTE) SARS-CoV-2 target nucleic acids are NOT DETECTED.  The SARS-CoV-2 RNA is generally detectable in upper respiratory specimens during the acute phase of infection. The lowest concentration of SARS-CoV-2 viral copies this assay can detect is 138 copies/mL. A negative result does not preclude SARS-Cov-2 infection and should not be used as the sole basis for treatment or other patient management decisions. A negative  result may occur with  improper specimen collection/handling, submission of specimen other than nasopharyngeal swab, presence of viral mutation(s) within the areas targeted by this assay, and inadequate number of viral copies(<138 copies/mL). A negative result must be combined with clinical observations, patient history, and epidemiological information. The expected result is Negative.  Fact Sheet for Patients:  EntrepreneurPulse.com.au  Fact Sheet for Healthcare Providers:  IncredibleEmployment.be  This test is no t yet approved or cleared by the [place] FDA and  has been authorized for detection and/or  diagnosis of SARS-CoV-2 by FDA under an Emergency Use Authorization (EUA). This EUA will remain  in effect (meaning this test can be used) for the duration of the COVID-19 declaration under Section 564(b)(1) of the Act, 21 U.S.C.section 360bbb-3(b)(1), unless the authorization is terminated  or revoked sooner.       Influenza A by PCR NEGATIVE NEGATIVE Final   Influenza B by PCR NEGATIVE NEGATIVE Final    Comment: (NOTE) The Xpert Xpress SARS-CoV-2/FLU/RSV plus assay is intended as an aid in the diagnosis of influenza from Nasopharyngeal swab specimens and should not be used as a sole basis for treatment. Nasal washings and aspirates are unacceptable for Xpert Xpress SARS-CoV-2/FLU/RSV testing.  Fact Sheet for Patients: EntrepreneurPulse.com.au  Fact Sheet for Healthcare Providers: IncredibleEmployment.be  This test is not yet approved or cleared by the [place] FDA and has been authorized for detection and/or diagnosis of SARS-CoV-2 by FDA under an Emergency Use Authorization (EUA). This EUA will remain in effect (meaning this test can be used) for the duration of the COVID-19 declaration under Section 564(b)(1) of the Act, 21 U.S.C. section 360bbb-3(b)(1), unless the authorization is terminated or revoked.  Performed at [facility], [address], [place], [place] [address]      Radiology Studies: US Abdomen Complete  Result Date: 10/29/2020 CLINICAL DATA:  Elevated bilirubin, history of prior cholecystectomy EXAM: ABDOMEN ULTRASOUND COMPLETE COMPARISON:  CTA of the chest from 10/29/2020 FINDINGS: Gallbladder: Surgically removed. Common bile duct: Diameter: 4.3 mm. Liver: Mild heterogeneity of the liver is noted without focal mass consistent with the cirrhotic change seen on recent CT examination. Portal vein is patent on color Doppler imaging with normal direction of blood flow towards the liver. IVC: No abnormality visualized.  Pancreas: Visualized portion unremarkable. Spleen: Size and appearance within normal limits. Right Kidney: Length: 10.5 cm. Echogenicity within normal limits. No mass or hydronephrosis visualized. Left Kidney: Length: 11.6 cm. Echogenicity within normal limits. No mass or hydronephrosis visualized. Abdominal aorta: No aneurysm visualized. Other findings: None. IMPRESSION: Cirrhotic change of the liver without focal mass. Status post cholecystectomy. No other focal abnormality is noted. Electronically Signed   By: Inez Catalina M.D.   On: 10/29/2020 17:14   ECHOCARDIOGRAM COMPLETE  Result Date: 10/29/2020    ECHOCARDIOGRAM REPORT   Patient Name:   SYNIYAH BOURNE Date of Exam: 10/29/2020 Medical Rec #:  [id number]         Height:       65.0 in Accession #:    [id number]        Weight:       194.0 lb Date of Birth:  1941-04-18          BSA:          1.953 m Patient Age:    10 years          BP:           141/70 mmHg Patient Gender: F  HR:           90 bpm. Exam Location:  [facility] Procedure: 2D Echo, Cardiac Doppler and Color Doppler Indications:    Pulmonary Embolus I26.09  History:        Patient has prior history of Echocardiogram examinations, most                 recent 02/12/2014. CAD, Arrythmias:non-specific ST changes; Risk                 Factors:Diabetes and Hypertension.  Sonographer:    Wenda Low Referring Phys: Brooks  1. Left ventricular ejection fraction, by estimation, is 65 to 70%. The left ventricle has normal function. The left ventricle has no regional wall motion abnormalities. Left ventricular diastolic parameters are consistent with Grade I diastolic dysfunction (impaired relaxation).  2. Right ventricular systolic function is normal. The right ventricular size is normal. There is normal pulmonary artery systolic pressure.  3. The mitral valve is normal in structure. No evidence of mitral valve regurgitation. No evidence of mitral stenosis.  4.  The aortic valve is tricuspid. Aortic valve regurgitation is not visualized. No aortic stenosis is present.  5. The inferior vena cava is normal in size with greater than 50% respiratory variability, suggesting right atrial pressure of 3 mmHg. FINDINGS  Left Ventricle: Left ventricular ejection fraction, by estimation, is 65 to 70%. The left ventricle has normal function. The left ventricle has no regional wall motion abnormalities. The left ventricular internal cavity size was normal in size. There is  no left ventricular hypertrophy. Left ventricular diastolic parameters are consistent with Grade I diastolic dysfunction (impaired relaxation). Right Ventricle: The right ventricular size is normal. No increase in right ventricular wall thickness. Right ventricular systolic function is normal. There is normal pulmonary artery systolic pressure. The tricuspid regurgitant velocity is 2.81 m/s, and  with an assumed right atrial pressure of 3 mmHg, the estimated right ventricular systolic pressure is 06.2 mmHg. Left Atrium: Left atrial size was normal in size. Right Atrium: Right atrial size was normal in size. Pericardium: There is no evidence of pericardial effusion. Mitral Valve: The mitral valve is normal in structure. No evidence of mitral valve regurgitation. No evidence of mitral valve stenosis. MV peak gradient, 3.6 mmHg. The mean mitral valve gradient is 1.0 mmHg. Tricuspid Valve: The tricuspid valve is normal in structure. Tricuspid valve regurgitation is mild . No evidence of tricuspid stenosis. Aortic Valve: The aortic valve is tricuspid. Aortic valve regurgitation is not visualized. No aortic stenosis is present. Aortic valve mean gradient measures 3.0 mmHg. Aortic valve peak gradient measures 5.1 mmHg. Aortic valve area, by VTI measures 3.38 cm. Pulmonic Valve: The pulmonic valve was normal in structure. Pulmonic valve regurgitation is not visualized. No evidence of pulmonic stenosis. Aorta: The aortic  root is normal in size and structure. Venous: The inferior vena cava is normal in size with greater than 50% respiratory variability, suggesting right atrial pressure of 3 mmHg. IAS/Shunts: No atrial level shunt detected by color flow Doppler.  LEFT VENTRICLE PLAX 2D LVIDd:         4.30 cm  Diastology LVIDs:         2.89 cm  LV e' medial:    7.10 cm/s LV PW:         1.17 cm  LV E/e' medial:  7.6 LV IVS:        0.91 cm  LV e' lateral:   9.11 cm/s  LVOT diam:     2.10 cm  LV E/e' lateral: 5.9 LV SV:         67 LV SV Index:   34 LVOT Area:     3.46 cm  RIGHT VENTRICLE RV S prime:     19.60 cm/s LEFT ATRIUM             Index       RIGHT ATRIUM           Index LA diam:        3.10 cm 1.59 cm/m  RA Area:     10.40 cm LA Vol (A2C):   48.2 ml 24.68 ml/m RA Volume:   17.20 ml  8.81 ml/m LA Vol (A4C):   27.2 ml 13.93 ml/m LA Biplane Vol: 36.8 ml 18.85 ml/m  AORTIC VALVE AV Area (Vmax):    2.89 cm AV Area (Vmean):   3.18 cm AV Area (VTI):     3.38 cm AV Vmax:           113.00 cm/s AV Vmean:          78.000 cm/s AV VTI:            0.199 m AV Peak Grad:      5.1 mmHg AV Mean Grad:      3.0 mmHg LVOT Vmax:         94.20 cm/s LVOT Vmean:        71.700 cm/s LVOT VTI:          0.194 m LVOT/AV VTI ratio: 0.97  AORTA Ao Root diam: 3.20 cm Ao Asc diam:  3.50 cm MITRAL VALVE               TRICUSPID VALVE MV Area (PHT): 2.36 cm    TR Peak grad:   31.6 mmHg MV Area VTI:   3.71 cm    TR Vmax:        281.00 cm/s MV Peak grad:  3.6 mmHg MV Mean grad:  1.0 mmHg    SHUNTS MV Vmax:       0.95 m/s    Systemic VTI:  0.19 m MV Vmean:      46.0 cm/s   Systemic Diam: 2.10 cm MV Decel Time: 321 msec MV E velocity: 53.90 cm/s MV A velocity: 99.80 cm/s MV E/A ratio:  0.54 Jenkins Rouge MD Electronically signed by Jenkins Rouge MD Signature Date/Time: 10/29/2020/4:46:14 PM    Final    [place] ELASTOGRAPHY LIVER  Result Date: 10/30/2020 CLINICAL DATA:  Early cirrhosis EXAM: US LIVER ELASTOGRAPHY TECHNIQUE: Sonography of the liver was performed. In  addition, ultrasound elastography evaluation of the liver was performed. A region of interest was placed within the right lobe of the liver. Following application of a compressive sonographic pulse, tissue compressibility was assessed. Multiple assessments were performed at the selected site. Median tissue compressibility was determined. Previously, hepatic stiffness was assessed by shear wave velocity. Based on recently published Society of Radiologists in Ultrasound consensus article, reporting is now recommended to be performed in the SI units of pressure (kiloPascals) representing hepatic stiffness/elasticity. The obtained result is compared to the published reference standards. (cACLD = compensated Advanced Chronic Liver Disease) COMPARISON:  None. FINDINGS: Liver: No focal lesion identified. Heterogeneous common nodular appearance of the liver with increased parenchymal echogenicity. Portal vein is patent on color Doppler imaging with normal direction of blood flow towards the liver. ULTRASOUND HEPATIC ELASTOGRAPHY Device: Siemens Helix VTQ Patient position: Supine Transducer: 5C1 Number  of measurements: 15 Hepatic segment:  8 Median kPa: 9.8 IQR: 1.6 IQR/Median kPa ratio: 0.2 Data quality:  Good Diagnostic category: >9 kPa and ?13 kPa: suggestive of cACLD, but needs further testing The use of hepatic elastography is applicable to patients with viral hepatitis and non-alcoholic fatty liver disease. At this time, there is insufficient data for the referenced cut-off values and use in other causes of liver disease, including alcoholic liver disease. Patients, however, may be assessed by elastography and serve as their own reference standard/baseline. In patients with non-alcoholic liver disease, the values suggesting compensated advanced chronic liver disease (cACLD) may be lower, and patients may need additional testing with elasticity results of 7-9 kPa. Please note that abnormal hepatic elasticity and shear  wave velocities may also be identified in clinical settings other than with hepatic fibrosis, such as: acute hepatitis, elevated right heart and central venous pressures including use of beta blockers, veno-occlusive disease (Budd-Chiari), infiltrative processes such as mastocytosis/amyloidosis/infiltrative tumor/lymphoma, extrahepatic cholestasis, with hyperemia in the post-prandial state, and with liver transplantation. Correlation with patient history, laboratory data, and clinical condition recommended. Diagnostic Categories: < or =5 kPa: high probability of being normal < or =9 kPa: in the absence of other known clinical signs, rules out cACLD >9 kPa and ?13 kPa: suggestive of cACLD, but needs further testing >13 kPa: highly suggestive of cACLD > or =17 kPa: highly suggestive of cACLD with an increased probability of clinically significant portal hypertension IMPRESSION: ULTRASOUND LIVER: 1.  Coarse, nodular hepatic echotexture suggesting cirrhosis. 2.  Hepatic steatosis. ULTRASOUND HEPATIC ELASTOGRAPHY: Median kPa:  9.8 Diagnostic category: >9 kPa and ?13 kPa: suggestive of cACLD, but needs further testing Electronically Signed   By: Eddie Candle M.D.   On: 10/30/2020 13:58     Scheduled Meds:  [MAR Hold] cholecalciferol  2,000 Units Oral Daily   [MAR Hold] empagliflozin  10 mg Oral QAC breakfast   [MAR Hold] folic acid  1 mg Oral Daily   [MAR Hold] gabapentin  100 mg Oral QHS   [MAR Hold] insulin aspart  0-15 Units Subcutaneous TID WC   [MAR Hold] insulin detemir  10 Units Subcutaneous QHS   [MAR Hold] isosorbide mononitrate  60 mg Oral Daily   [MAR Hold] levETIRAcetam  500 mg Oral BID   [MAR Hold] metoprolol tartrate  50 mg Oral BID   [MAR Hold] pantoprazole (PROTONIX) IV  40 mg Intravenous Q12H   [MAR Hold] polyethylene glycol  17 g Oral Daily   [MAR Hold] predniSONE  5 mg Oral Daily   [MAR Hold] spironolactone  12.5 mg Oral Daily   Continuous Infusions:  sodium chloride     [MAR Hold]  cefTRIAXone (ROCEPHIN)  IV 1 g (10/30/20 1757)   heparin Stopped (10/31/20 0903)   lactated ringers 1,000 mL (10/31/20 1316)     LOS: 1 day    Time spent: 35 minutes   Barton Dubois, MD [facility]   To contact the attending provider between 7A-7P or the covering provider during after hours 7P-7A, please log into the web site [email or web address] and access using universal [place] password for that web site. If you do not have the password, please call the hospital operator.  10/31/2020, 2:43 PM

## 2020-10-31 NOTE — Anesthesia Postprocedure Evaluation (Signed)
Anesthesia Post Note  Patient: Melissa Reynolds  Procedure(s) Performed: ESOPHAGOGASTRODUODENOSCOPY (EGD) WITH PROPOFOL POLYPECTOMY  Patient location during evaluation: PACU Anesthesia Type: General Level of consciousness: awake and alert and oriented Pain management: pain level controlled Vital Signs Assessment: post-procedure vital signs reviewed and stable Respiratory status: spontaneous breathing and respiratory function stable Cardiovascular status: blood pressure returned to baseline and stable Postop Assessment: no apparent nausea or vomiting Anesthetic complications: no   No notable events documented.   Last Vitals:  Vitals:   10/31/20 1418 10/31/20 1430  BP: (!) 116/56 (!) 123/59  Pulse: 84 82  Resp: (!) 26 (!) 22  Temp: 36.9 C   SpO2: 97% 99%    Last Pain:  Vitals:   10/31/20 1418  TempSrc:   PainSc: Asleep                 Coila Wardell C Malacki Mcphearson

## 2020-11-01 DIAGNOSIS — I2699 Other pulmonary embolism without acute cor pulmonale: Secondary | ICD-10-CM

## 2020-11-01 DIAGNOSIS — K219 Gastro-esophageal reflux disease without esophagitis: Secondary | ICD-10-CM

## 2020-11-01 DIAGNOSIS — I82502 Chronic embolism and thrombosis of unspecified deep veins of left lower extremity: Secondary | ICD-10-CM

## 2020-11-01 DIAGNOSIS — E1165 Type 2 diabetes mellitus with hyperglycemia: Secondary | ICD-10-CM

## 2020-11-01 LAB — CBC
HCT: 27.9 % — ABNORMAL LOW (ref 36.0–46.0)
Hemoglobin: 9.4 g/dL — ABNORMAL LOW (ref 12.0–15.0)
MCH: 26 pg (ref 26.0–34.0)
MCHC: 33.7 g/dL (ref 30.0–36.0)
MCV: 77.1 fL — ABNORMAL LOW (ref 80.0–100.0)
Platelets: 251 10*3/uL (ref 150–400)
RBC: 3.62 MIL/uL — ABNORMAL LOW (ref 3.87–5.11)
RDW: 16 % — ABNORMAL HIGH (ref 11.5–15.5)
WBC: 17.6 10*3/uL — ABNORMAL HIGH (ref 4.0–10.5)

## 2020-11-01 LAB — GLUCOSE, CAPILLARY
Glucose-Capillary: 125 mg/dL — ABNORMAL HIGH (ref 70–99)
Glucose-Capillary: 146 mg/dL — ABNORMAL HIGH (ref 70–99)

## 2020-11-01 LAB — APTT
aPTT: 104 seconds — ABNORMAL HIGH (ref 24–36)
aPTT: 107 seconds — ABNORMAL HIGH (ref 24–36)

## 2020-11-01 LAB — HEPARIN LEVEL (UNFRACTIONATED): Heparin Unfractionated: 0.81 IU/mL — ABNORMAL HIGH (ref 0.30–0.70)

## 2020-11-01 MED ORDER — POLYETHYLENE GLYCOL 3350 17 G PO PACK: 17.0000 g | PACK | Freq: Every day | ORAL | 1 refills | Status: AC

## 2020-11-01 MED ORDER — APIXABAN 5 MG PO TABS: 5.0000 mg | ORAL_TABLET | Freq: Two times a day (BID) | ORAL | 3 refills | Status: AC

## 2020-11-01 MED ORDER — PANTOPRAZOLE SODIUM 40 MG PO TBEC: 40.0000 mg | DELAYED_RELEASE_TABLET | Freq: Every day | ORAL | Status: DC

## 2020-11-01 MED ORDER — ROSUVASTATIN CALCIUM 5 MG PO TABS: 5.0000 mg | ORAL_TABLET | Freq: Every day | ORAL | Status: AC

## 2020-11-01 NOTE — Discharge Summary (Signed)
Physician Discharge Summary  Melissa Reynolds:[id number] DOB: [birth year] DOA: 10/29/2020  PCP: [name], MD  Admit date: 10/29/2020 Discharge date: 11/01/2020  Time spent: 35 minutes  Recommendations for Outpatient Follow-up:  Repeat CBC to follow hemoglobin trend and instability. Repeat complete metabolic panel to follow electrolytes LFTs and renal function. Make sure patient follow-up with gastroenterology service as instructed. Reassessed LFTs stability for statins resumption. Reassess blood pressure and further adjust antihypertensive regimen as required. Continue to closely follow CBGs and adjust hypoglycemic regimen as needed.   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  Discharged home with instruction to follow-up PCP and gastroenterology service.  CODE STATUS: Full code.  Diet recommendation: Heart healthy/modified carbohydrate diet.  Filed Weights   10/30/20 0500 10/31/20 1304 11/01/20 0500  Weight: 88.5 kg 89.8 kg 89.3 kg    History of present illness:  Melissa Reynolds is a 80 y.o. female with medical history significant of recent pulmonary embolism/left lower extremity DVT; seizure disorder, type 2 diabetes mellitus, hypertension, chronic diastolic heart failure, hyperlipidemia, gastroesophageal reflux disease and history of coronary disease.  Who presented to the hospital secondary to worsening chest discomfort, general malaise and dyspnea on exertion.  Patient reported as since she was diagnosed approximately 2 weeks ago with left lower extremity DVT and pulmonary embolism she has been experiencing general malaise, decreased appetite, intermittent pleuritic chest discomfort and shortness of breath on exertion.  No nausea, no vomiting, no  dysuria, no hematuria, no overt bleeding.  Patient denies any fever, chills or sick contacts.   COVID PCR ordered and pending at time of admission; patient is unvaccinated.   ED Course: Overall stable blood work except for transaminitis, positive fecal occult blood test and CT angiogram demonstrating worsening pulmonary embolism findings.  Case discussed with hematology service who recommended admission for heparin drip, 2D echo and evaluation by GI due to elevated LFTs and fecal occult blood test.  [facility] has been contacted to place in the hospital to facilitate evaluation.  Hospital Course:  1-pleuritic chest discomfort, shortness of breath and general malaise -Repeat CT angiogram demonstrating worsening pulmonary embolism findings with enlargement of pulmonary trunk -2D echo ruling out right heart strain or pulmonary pressure abnormalities. -Continue anticoagulation indefinitely at discharge  -will treat with Eliquis BID; after discussing with hematology service not need for loading dose.   2-positive fecal occult blood testing/acute blood loss anemia -There is reports concerning for melanotic stools -No overt bleeding since admission; hemoglobin reasonably stable.   -No transfusion needed at this time. -Status post endoscopy; which demonstrated no acute bleeding and positive small varix and small gastric polyp. -safe to advance diet -Continue PPI daily -patient clear to resume anticoagulation -avoid NSAID's   3-essential hypertension -Blood pressure is stable and well-controlled -Continue current antihypertensive agents.  4-chronic diastolic heart failure -Grade 1 diastolic dysfunction appreciated on repeat echocardiogram. -Preserved ejection fraction and no wall motion abnormality -Continue the use of beta-blocker and spironolactone. -Will continue to follow daily weights, and low-sodium diet  -Continue home medication for blood pressure management and resume HCTZ at discharge.    5-history of seizure disorder -No seizure activity appreciated -Continue Keppra.   6-hyperlipidemia -Continue to hold statins in the setting of transaminitis.   7-transaminitis/early cirrhotic changes  -With concerns for cirrhotic  changes on CT abdomen and pelvis -Appreciate gastroenterology service assistance and recommendations. -Continue minimizing the use of hepatotoxic agents -Continue to follow LFTs trend -Outpatient follow-up with gastroenterology has been recommended.   8-type 2 diabetes mellitus -Patient advised to follow modified carbohydrate diet -Resume home hypoglycemic regimen.  Procedures: See below for x-ray reports EGD 10/31/20  Consultations: Hematology GI service  Discharge Exam: Vitals:   11/01/20 0631 11/01/20 0812  BP: (!) 110/57 132/66  Pulse: 78 79  Resp: 18   Temp: 98.7 F (37.1 C)   SpO2: 97% 99%   General exam: Alert, awake, oriented x 3; reports no chest pain and no shortness of breath.  No overt bleeding appreciated. Respiratory system: Clear to auscultation. Respiratory effort normal.  No requiring oxygen supplementation. Cardiovascular system:RRR. No murmurs, rubs, gallops.  No JVD. Gastrointestinal system: Abdomen is nondistended, soft and nontender. No organomegaly or masses felt. Normal bowel sounds heard. Central nervous system: Alert and oriented. No focal neurological deficits. Extremities: No cyanosis or clubbing. Skin: No petechiae. Psychiatry: Judgement and insight appear normal. Mood & affect appropriate.   Discharge Instructions   Discharge Instructions     (HEART FAILURE PATIENTS) Call MD:  Anytime you have any of the following symptoms: 1) 3 pound weight gain in 24 hours or 5 pounds in 1 week 2) shortness of breath, with or without a dry hacking cough 3) swelling in the hands, feet or stomach 4) if you have to sleep on extra pillows at night in order to breathe.   Complete by: As directed    Diet - low sodium heart  healthy   Complete by: As directed    Diet Carb Modified   Complete by: As directed    Discharge instructions   Complete by: As directed    Take medications as prescribed Maintain adequate hydration Avoid the use of NSAIDs Arrange follow-up with gastroenterology in 2 weeks Follow-up with PCP in 10 days.      Allergies as of 11/01/2020       Reactions   Sulfa Antibiotics Rash        Medication List     STOP taking these medications    cyclobenzaprine 10 MG tablet Commonly known as: FLEXERIL   predniSONE 5 MG tablet Commonly known as: DELTASONE   Rivaroxaban Stater Pack (15 mg and 20 mg) Commonly known as: XARELTO STARTER PACK       TAKE these medications    acetaminophen 500 MG tablet Commonly known as: TYLENOL Take 1,000 mg by mouth every 6 (six) hours as needed for moderate pain or headache.   amLODipine 5 MG tablet Commonly known as: NORVASC Take 5 mg by mouth 2 (two) times daily.   apixaban 5 MG Tabs tablet Commonly known as: ELIQUIS Take 1 tablet (5 mg total) by mouth 2 (two) times daily.   Basaglar KwikPen 100 UNIT/ML Inject 5 Units into the skin at bedtime.   cholecalciferol 25 MCG (1000 UNIT) tablet Commonly known as: VITAMIN D Take 2,000 Units by mouth daily.   empagliflozin 10 MG Tabs tablet Commonly known as: Jardiance Take 1 tablet (10 mg total) by mouth daily before breakfast.   folic acid 1 MG tablet Commonly known as: FOLVITE Take 1 mg by mouth daily.   gabapentin 100 MG capsule Commonly known as: NEURONTIN Take 100 mg by mouth at bedtime.   hydrochlorothiazide 25 MG tablet Commonly known as: HYDRODIURIL Take 25 mg by mouth daily.   HYDROcodone-acetaminophen 10-325 MG tablet Commonly known as:  NORCO Take 1 tablet by mouth every 6 (six) hours as needed.   isosorbide mononitrate 60 MG 24 hr tablet Commonly known as: IMDUR Take 1 tablet by mouth once daily   levETIRAcetam 750 MG tablet Commonly known as: KEPPRA Take 750  mg by mouth 2 (two) times daily.   metFORMIN 500 MG tablet Commonly known as: GLUCOPHAGE Take 500 mg by mouth 2 (two) times daily with a meal.   metoprolol tartrate 50 MG tablet Commonly known as: LOPRESSOR Take 1 tablet by mouth twice daily   nitroGLYCERIN 0.4 MG SL tablet Commonly known as: NITROSTAT Place 0.4 mg under the tongue every 5 (five) minutes x 3 doses as needed.   ondansetron 4 MG tablet Commonly known as: ZOFRAN Take 1 tablet (4 mg total) by mouth every 6 (six) hours as needed for nausea or vomiting.   pantoprazole 40 MG tablet Commonly known as: PROTONIX Take 1 tablet by mouth once daily   polyethylene glycol 17 g packet Commonly known as: MIRALAX / GLYCOLAX Take 17 g by mouth daily. Start taking on: November 02, 2020   potassium chloride SA 20 MEQ tablet Commonly known as: KLOR-CON Take 1 tablet (20 mEq total) by mouth 2 (two) times daily.   rosuvastatin 5 MG tablet Commonly known as: CRESTOR Take 1 tablet (5 mg total) by mouth daily. Hold until follow up with PCP What changed: additional instructions   spironolactone 25 MG tablet Commonly known as: ALDACTONE Take 0.5 tablets (12.5 mg total) by mouth daily.       Allergies  Allergen Reactions   Sulfa Antibiotics Rash    Follow-up Information     [facility].   Specialty: Home Health Services Why: Home health services will be provided by [facility].        [name], MD. Schedule an appointment as soon as possible for a visit in 10 day(s).   Specialty: Family Medicine Contact information: [address] [place] [address] [phone]         Juanita Craver, MD. Schedule an appointment as soon as possible for a visit in 2 week(s).   Specialty: Gastroenterology Contact information: [address], [place] [address] [phone]                 The results of significant diagnostics from this hospitalization  (including imaging, microbiology, ancillary and laboratory) are listed below for reference.    Significant Diagnostic Studies: CT Angio Chest PE W and/or Wo Contrast  Result Date: 10/29/2020 CLINICAL DATA:  Weakness and loss of appetite. Evaluate for pulmonary embolus. EXAM: CT ANGIOGRAPHY CHEST WITH CONTRAST TECHNIQUE: Multidetector CT imaging of the chest was performed using the standard protocol during bolus administration of intravenous contrast. Multiplanar CT image reconstructions and MIPs were obtained to evaluate the vascular anatomy. CONTRAST:  175mL OMNIPAQUE IOHEXOL 350 MG/ML SOLN COMPARISON:  10/10/2020. FINDINGS: Cardiovascular: Segmental and subsegmental filling defects in the right lower lobe pulmonary arteries, increased from 10/10/2020. Atherosclerotic calcification of the aorta. Pulmonic trunk and heart are enlarged. No pericardial effusion. Mediastinum/Nodes: Surgical clips in the region of the right thyroid. No pathologically enlarged mediastinal, hilar or axillary lymph nodes. Esophagus is grossly unremarkable. Lungs/Pleura: A few scattered pulmonary nodules measure up to 7 mm in the right upper lobe (6/54), similar to 10/10/2020. Scattered volume loss in the lung bases. No pleural fluid. Airway is unremarkable. Upper Abdomen: Liver margin is irregular. Visualized portions of the liver,  adrenal glands, left kidney, spleen, pancreas, stomach and bowel are otherwise unremarkable. Small ascites in the left upper quadrant. Musculoskeletal: Degenerative changes in the spine. Review of the MIP images confirms the above findings. IMPRESSION: 1. Segmental/subsegmental pulmonary emboli in the right lower lobe, increased from 10/10/2020. Critical Value/emergent results were called by telephone at the time of interpretation on 10/29/2020 at 12:12 pm to provider JOSEPH ZAMMIT , who verbally acknowledged these results. 2. Scattered pulmonary nodules measure up to 7 mm in the right upper lobe.  Non-contrast chest CT at 3-6 months is recommended. If the nodules are stable at time of repeat CT, then future CT at 18-24 months (from today's scan) is considered optional for low-risk patients, but is recommended for high-risk patients. This recommendation follows the consensus statement: Guidelines for Management of Incidental Pulmonary Nodules Detected on CT Images: From the Fleischner Society 2017; Radiology 2017; 284:228-243. 3. Cirrhosis. 4.  Aortic atherosclerosis (ICD10-I70.0). 5. Enlarged pulmonic trunk, indicative of pulmonary arterial hypertension. Electronically Signed   By: Lorin Picket M.D.   On: 10/29/2020 12:12   CT Angio Chest PE W and/or Wo Contrast  Result Date: 10/10/2020 CLINICAL DATA:  Shortness of breath. EXAM: CT ANGIOGRAPHY CHEST WITH CONTRAST TECHNIQUE: Multidetector CT imaging of the chest was performed using the standard protocol during bolus administration of intravenous contrast. Multiplanar CT image reconstructions and MIPs were obtained to evaluate the vascular anatomy. CONTRAST:  119mL OMNIPAQUE IOHEXOL 350 MG/ML SOLN COMPARISON:  None. FINDINGS: Cardiovascular: Several small filling defects are noted in peripheral branches of the right pulmonary artery concerning for pulmonary emboli. Normal cardiac size. No pericardial effusion. Atherosclerosis of thoracic aorta is noted without aneurysm or dissection. Mild coronary artery calcifications are noted. Mediastinum/Nodes: No enlarged mediastinal, hilar, or axillary lymph nodes. Thyroid gland, trachea, and esophagus demonstrate no significant findings. Lungs/Pleura: Lungs are clear. No pleural effusion or pneumothorax. Upper Abdomen: No acute abnormality. Musculoskeletal: No chest wall abnormality. No acute or significant osseous findings. Review of the MIP images confirms the above findings. IMPRESSION: Multiple small peripheral pulmonary emboli are noted in branches of the right pulmonary artery. Critical Value/emergent results  were called by telephone at the time of interpretation on 10/10/2020 at 3:06 pm to provider JOSHUA LONG , who verbally acknowledged these results. Mild coronary artery calcifications are noted. Aortic Atherosclerosis (ICD10-I70.0). Electronically Signed   By: Marijo Conception M.D.   On: 10/10/2020 15:06   US Abdomen Complete  Result Date: 10/29/2020 CLINICAL DATA:  Elevated bilirubin, history of prior cholecystectomy EXAM: ABDOMEN ULTRASOUND COMPLETE COMPARISON:  CTA of the chest from 10/29/2020 FINDINGS: Gallbladder: Surgically removed. Common bile duct: Diameter: 4.3 mm. Liver: Mild heterogeneity of the liver is noted without focal mass consistent with the cirrhotic change seen on recent CT examination. Portal vein is patent on color Doppler imaging with normal direction of blood flow towards the liver. IVC: No abnormality visualized. Pancreas: Visualized portion unremarkable. Spleen: Size and appearance within normal limits. Right Kidney: Length: 10.5 cm. Echogenicity within normal limits. No mass or hydronephrosis visualized. Left Kidney: Length: 11.6 cm. Echogenicity within normal limits. No mass or hydronephrosis visualized. Abdominal aorta: No aneurysm visualized. Other findings: None. IMPRESSION: Cirrhotic change of the liver without focal mass. Status post cholecystectomy. No other focal abnormality is noted. Electronically Signed   By: Inez Catalina M.D.   On: 10/29/2020 17:14   US Venous Img Lower Unilateral Left (DVT)  Result Date: 10/10/2020 CLINICAL DATA:  Localized swelling Sudden onset lower extremity pain EXAM: LEFT LOWER  EXTREMITY VENOUS DOPPLER ULTRASOUND TECHNIQUE: Gray-scale sonography with compression, as well as color and duplex ultrasound, were performed to evaluate the deep venous system(s) from the level of the common femoral vein through the popliteal and proximal calf veins. COMPARISON:  None. FINDINGS: VENOUS Normal compressibility of the common and superficial femoral veins.  Visualized portions of profunda femoral vein and great saphenous vein unremarkable. The popliteal, posterior tibial, and peroneal veins are thrombosed. Otherwise no additional filling defects to suggest DVT on grayscale or color Doppler imaging. Doppler waveforms show normal direction of venous flow, normal respiratory plasticity and response to augmentation. OTHER None. Limitations: none IMPRESSION: Acute thrombosis of the popliteal, posterior tibial, and peroneal veins. These results will be called to the ordering clinician or representative by the Radiologist Assistant, and communication documented in the PACS or [employer]. Electronically Signed   By: Miachel Roux M.D.   On: 10/10/2020 11:08   ECHOCARDIOGRAM COMPLETE  Result Date: 10/29/2020    ECHOCARDIOGRAM REPORT   Patient Name:   Melissa Reynolds Date of Exam: 10/29/2020 Medical Rec #:  [id number]         Height:       65.0 in Accession #:    [id number]        Weight:       194.0 lb Date of Birth:  12-Aug-1940          BSA:          1.953 m Patient Age:    89 years          BP:           141/70 mmHg Patient Gender: F                 HR:           90 bpm. Exam Location:  [facility] Procedure: 2D Echo, Cardiac Doppler and Color Doppler Indications:    Pulmonary Embolus I26.09  History:        Patient has prior history of Echocardiogram examinations, most                 recent 02/12/2014. CAD, Arrythmias:non-specific ST changes; Risk                 Factors:Diabetes and Hypertension.  Sonographer:    Wenda Low Referring Phys: [place]  1. Left ventricular ejection fraction, by estimation, is 65 to 70%. The left ventricle has normal function. The left ventricle has no regional wall motion abnormalities. Left ventricular diastolic parameters are consistent with Grade I diastolic dysfunction (impaired relaxation).  2. Right ventricular systolic function is normal. The right ventricular size is normal. There is normal pulmonary  artery systolic pressure.  3. The mitral valve is normal in structure. No evidence of mitral valve regurgitation. No evidence of mitral stenosis.  4. The aortic valve is tricuspid. Aortic valve regurgitation is not visualized. No aortic stenosis is present.  5. The inferior vena cava is normal in size with greater than 50% respiratory variability, suggesting right atrial pressure of 3 mmHg. FINDINGS  Left Ventricle: Left ventricular ejection fraction, by estimation, is 65 to 70%. The left ventricle has normal function. The left ventricle has no regional wall motion abnormalities. The left ventricular internal cavity size was normal in size. There is  no left ventricular hypertrophy. Left ventricular diastolic parameters are consistent with Grade I diastolic dysfunction (impaired relaxation). Right Ventricle: The right ventricular size is normal. No increase in right ventricular  wall thickness. Right ventricular systolic function is normal. There is normal pulmonary artery systolic pressure. The tricuspid regurgitant velocity is 2.81 m/s, and  with an assumed right atrial pressure of 3 mmHg, the estimated right ventricular systolic pressure is 00.9 mmHg. Left Atrium: Left atrial size was normal in size. Right Atrium: Right atrial size was normal in size. Pericardium: There is no evidence of pericardial effusion. Mitral Valve: The mitral valve is normal in structure. No evidence of mitral valve regurgitation. No evidence of mitral valve stenosis. MV peak gradient, 3.6 mmHg. The mean mitral valve gradient is 1.0 mmHg. Tricuspid Valve: The tricuspid valve is normal in structure. Tricuspid valve regurgitation is mild . No evidence of tricuspid stenosis. Aortic Valve: The aortic valve is tricuspid. Aortic valve regurgitation is not visualized. No aortic stenosis is present. Aortic valve mean gradient measures 3.0 mmHg. Aortic valve peak gradient measures 5.1 mmHg. Aortic valve area, by VTI measures 3.38 cm. Pulmonic  Valve: The pulmonic valve was normal in structure. Pulmonic valve regurgitation is not visualized. No evidence of pulmonic stenosis. Aorta: The aortic root is normal in size and structure. Venous: The inferior vena cava is normal in size with greater than 50% respiratory variability, suggesting right atrial pressure of 3 mmHg. IAS/Shunts: No atrial level shunt detected by color flow Doppler.  LEFT VENTRICLE PLAX 2D LVIDd:         4.30 cm  Diastology LVIDs:         2.89 cm  LV e' medial:    7.10 cm/s LV PW:         1.17 cm  LV E/e' medial:  7.6 LV IVS:        0.91 cm  LV e' lateral:   9.11 cm/s LVOT diam:     2.10 cm  LV E/e' lateral: 5.9 LV SV:         67 LV SV Index:   34 LVOT Area:     3.46 cm  RIGHT VENTRICLE RV S prime:     19.60 cm/s LEFT ATRIUM             Index       RIGHT ATRIUM           Index LA diam:        3.10 cm 1.59 cm/m  RA Area:     10.40 cm LA Vol (A2C):   48.2 ml 24.68 ml/m RA Volume:   17.20 ml  8.81 ml/m LA Vol (A4C):   27.2 ml 13.93 ml/m LA Biplane Vol: 36.8 ml 18.85 ml/m  AORTIC VALVE AV Area (Vmax):    2.89 cm AV Area (Vmean):   3.18 cm AV Area (VTI):     3.38 cm AV Vmax:           113.00 cm/s AV Vmean:          78.000 cm/s AV VTI:            0.199 m AV Peak Grad:      5.1 mmHg AV Mean Grad:      3.0 mmHg LVOT Vmax:         94.20 cm/s LVOT Vmean:        71.700 cm/s LVOT VTI:          0.194 m LVOT/AV VTI ratio: 0.97  AORTA Ao Root diam: 3.20 cm Ao Asc diam:  3.50 cm MITRAL VALVE               TRICUSPID VALVE  MV Area (PHT): 2.36 cm    TR Peak grad:   31.6 mmHg MV Area VTI:   3.71 cm    TR Vmax:        281.00 cm/s MV Peak grad:  3.6 mmHg MV Mean grad:  1.0 mmHg    SHUNTS MV Vmax:       0.95 m/s    Systemic VTI:  0.19 m MV Vmean:      46.0 cm/s   Systemic Diam: 2.10 cm MV Decel Time: 321 msec MV E velocity: 53.90 cm/s MV A velocity: 99.80 cm/s MV E/A ratio:  0.54 Jenkins Rouge MD Electronically signed by Jenkins Rouge MD Signature Date/Time: 10/29/2020/4:46:14 PM    Final    [place]  ELASTOGRAPHY LIVER  Result Date: 10/30/2020 CLINICAL DATA:  Early cirrhosis EXAM: US LIVER ELASTOGRAPHY TECHNIQUE: Sonography of the liver was performed. In addition, ultrasound elastography evaluation of the liver was performed. A region of interest was placed within the right lobe of the liver. Following application of a compressive sonographic pulse, tissue compressibility was assessed. Multiple assessments were performed at the selected site. Median tissue compressibility was determined. Previously, hepatic stiffness was assessed by shear wave velocity. Based on recently published Society of Radiologists in Ultrasound consensus article, reporting is now recommended to be performed in the SI units of pressure (kiloPascals) representing hepatic stiffness/elasticity. The obtained result is compared to the published reference standards. (cACLD = compensated Advanced Chronic Liver Disease) COMPARISON:  None. FINDINGS: Liver: No focal lesion identified. Heterogeneous common nodular appearance of the liver with increased parenchymal echogenicity. Portal vein is patent on color Doppler imaging with normal direction of blood flow towards the liver. ULTRASOUND HEPATIC ELASTOGRAPHY Device: Siemens Helix VTQ Patient position: Supine Transducer: 5C1 Number of measurements: 15 Hepatic segment:  8 Median kPa: 9.8 IQR: 1.6 IQR/Median kPa ratio: 0.2 Data quality:  Good Diagnostic category: >9 kPa and ?13 kPa: suggestive of cACLD, but needs further testing The use of hepatic elastography is applicable to patients with viral hepatitis and non-alcoholic fatty liver disease. At this time, there is insufficient data for the referenced cut-off values and use in other causes of liver disease, including alcoholic liver disease. Patients, however, may be assessed by elastography and serve as their own reference standard/baseline. In patients with non-alcoholic liver disease, the values suggesting compensated advanced chronic liver  disease (cACLD) may be lower, and patients may need additional testing with elasticity results of 7-9 kPa. Please note that abnormal hepatic elasticity and shear wave velocities may also be identified in clinical settings other than with hepatic fibrosis, such as: acute hepatitis, elevated right heart and central venous pressures including use of beta blockers, veno-occlusive disease (Budd-Chiari), infiltrative processes such as mastocytosis/amyloidosis/infiltrative tumor/lymphoma, extrahepatic cholestasis, with hyperemia in the post-prandial state, and with liver transplantation. Correlation with patient history, laboratory data, and clinical condition recommended. Diagnostic Categories: < or =5 kPa: high probability of being normal < or =9 kPa: in the absence of other known clinical signs, rules out cACLD >9 kPa and ?13 kPa: suggestive of cACLD, but needs further testing >13 kPa: highly suggestive of cACLD > or =17 kPa: highly suggestive of cACLD with an increased probability of clinically significant portal hypertension IMPRESSION: ULTRASOUND LIVER: 1.  Coarse, nodular hepatic echotexture suggesting cirrhosis. 2.  Hepatic steatosis. ULTRASOUND HEPATIC ELASTOGRAPHY: Median kPa:  9.8 Diagnostic category: >9 kPa and ?13 kPa: suggestive of cACLD, but needs further testing Electronically Signed   By: Eddie Candle M.D.   On: 10/30/2020 13:58  Microbiology: Recent Results (from the past 240 hour(s))  Resp Panel by RT-PCR (Flu A&B, Covid) Nasopharyngeal Swab     Status: None   Collection Time: 10/29/20  7:08 PM   Specimen: Nasopharyngeal Swab; Nasopharyngeal(NP) swabs in vial transport medium  Result Value Ref Range Status   SARS Coronavirus 2 by RT PCR NEGATIVE NEGATIVE Final    Comment: (NOTE) SARS-CoV-2 target nucleic acids are NOT DETECTED.  The SARS-CoV-2 RNA is generally detectable in upper respiratory specimens during the acute phase of infection. The lowest concentration of SARS-CoV-2 viral copies  this assay can detect is 138 copies/mL. A negative result does not preclude SARS-Cov-2 infection and should not be used as the sole basis for treatment or other patient management decisions. A negative result may occur with  improper specimen collection/handling, submission of specimen other than nasopharyngeal swab, presence of viral mutation(s) within the areas targeted by this assay, and inadequate number of viral copies(<138 copies/mL). A negative result must be combined with clinical observations, patient history, and epidemiological information. The expected result is Negative.  Fact Sheet for Patients:  EntrepreneurPulse.com.au  Fact Sheet for Healthcare Providers:  IncredibleEmployment.be  This test is no t yet approved or cleared by the [place] FDA and  has been authorized for detection and/or diagnosis of SARS-CoV-2 by FDA under an Emergency Use Authorization (EUA). This EUA will remain  in effect (meaning this test can be used) for the duration of the COVID-19 declaration under Section 564(b)(1) of the Act, 21 U.S.C.section 360bbb-3(b)(1), unless the authorization is terminated  or revoked sooner.       Influenza A by PCR NEGATIVE NEGATIVE Final   Influenza B by PCR NEGATIVE NEGATIVE Final    Comment: (NOTE) The Xpert Xpress SARS-CoV-2/FLU/RSV plus assay is intended as an aid in the diagnosis of influenza from Nasopharyngeal swab specimens and should not be used as a sole basis for treatment. Nasal washings and aspirates are unacceptable for Xpert Xpress SARS-CoV-2/FLU/RSV testing.  Fact Sheet for Patients: EntrepreneurPulse.com.au  Fact Sheet for Healthcare Providers: IncredibleEmployment.be  This test is not yet approved or cleared by the [place] FDA and has been authorized for detection and/or diagnosis of SARS-CoV-2 by FDA under an Emergency Use Authorization (EUA). This EUA  will remain in effect (meaning this test can be used) for the duration of the COVID-19 declaration under Section 564(b)(1) of the Act, 21 U.S.C. section 360bbb-3(b)(1), unless the authorization is terminated or revoked.  Performed at [facility], [address] [place], [place] [address]      Labs: Basic Metabolic Panel: Recent Labs  Lab 10/29/20 1009 10/30/20 0415  NA 135 135  K 4.2 4.9  CL 101 102  CO2 23 22  GLUCOSE 186* 126*  BUN 16 16  CREATININE 0.81 0.73  CALCIUM 8.4* 8.0*  MG 1.1*  --   PHOS 2.9  --    Liver Function Tests: Recent Labs  Lab 10/29/20 1009 10/29/20 1637 10/30/20 0415  AST 79*  --  90*  ALT 36  --  36  ALKPHOS 221*  --  212*  BILITOT 2.1* 2.0* 1.6*  PROT 6.2*  --  5.6*  ALBUMIN 2.8*  --  2.5*   CBC: Recent Labs  Lab 10/29/20 1009 10/30/20 0511 10/31/20 0742 11/01/20 0538  WBC 17.8* 19.5* 21.1* 17.6*  NEUTROABS 14.1*  --   --   --   HGB 10.5* 9.9* 9.8* 9.4*  HCT 30.9* 28.5* 28.5* 27.9*  MCV 75.9* 75.8* 76.4* 77.1*  PLT 261 253 287 251    CBG: Recent Labs  Lab 10/31/20 1336 10/31/20 1424 10/31/20 1617 10/31/20 2035 11/01/20 0742  GLUCAP 84 105* 115* 156* 125*     Signed:  Barton Dubois MD.  [facility] 11/01/2020, 10:29 AM

## 2020-11-01 NOTE — Progress Notes (Signed)
Both IV's removed. Discharge paper work given and reviewed with patient. Patient wheeled out via wheelchair.

## 2020-11-01 NOTE — Progress Notes (Signed)
ANTICOAGULATION CONSULT NOTE - Follow Up Consult  Pharmacy Consult for heparin Indication:  PE/DVT   Labs: Recent Labs    10/29/20 1009 10/29/20 1435 10/29/20 1845 10/29/20 2224 10/30/20 0415 10/30/20 0511 10/30/20 0806 10/30/20 1510 10/30/20 1936 10/31/20 0519 10/31/20 0742 10/31/20 2307  HGB 10.5*  --   --   --   --  9.9*  --   --   --   --  9.8*  --   HCT 30.9*  --   --   --   --  28.5*  --   --   --   --  28.5*  --   PLT 261  --   --   --   --  253  --   --   --   --  287  --   APTT  --  37*  --    < >  --   --  68*   < > 130* 132*  --  104*  LABPROT  --   --  32.5*  --   --   --  22.5*  --   --   --   --   --   INR  --   --  3.2*  --   --   --  2.0*  --   --   --   --   --   HEPARINUNFRC  --  >1.10*  --   --   --   --   --   --   --  >1.10*  --   --   CREATININE 0.81  --   --   --  0.73  --   --   --   --   --   --   --    < > = values in this interval not displayed.     Assessment: Pharmacy consulted to dose heparin in patient for VTE treatment.  Patient recently diagnosed with left leg DVT and PE on 10/10/2020.  CT angio shows pulmonary emboli in RLL which have increased from 6/3. She is on Xarelto prior to admission with last dose 6/21 @ 2100 per patient.  Today would be day 20 from initiation of Xarelto so would still be on BID dosing.  Hgb 9.9- heme positive stool Platelets WNL 6/24 Heparin turned off at 0923 for ENDO procedure at 1400 and restarted at 1504 per GI recommendations to restart.  aPTT 104 sec after restart  Goal of Therapy:  aPTT 66-102 seconds   Plan:  Decrease heparin to 750 units/hr F/U restart of xarelto  Thanks for allowing pharmacy to be a part of this patient's care.  Excell Seltzer, PharmD Clinical Pharmacist  11/01/2020,2:11 AM

## 2020-11-01 NOTE — Progress Notes (Addendum)
Patient does not like liquid diet.  No further bleeding overnight Her only concern is that of constipation.  Vital signs in last 24 hours: Temp:  [98 F (36.7 C)-98.7 F (37.1 C)] 98.7 F (37.1 C) (06/25 0631) Pulse Rate:  [78-88] 79 (06/25 0812) Resp:  [16-26] 18 (06/25 0631) BP: (107-144)/(49-70) 132/66 (06/25 0812) SpO2:  [94 %-99 %] 99 % (06/25 0812) Weight:  [89.3 kg-89.8 kg] 89.3 kg (06/25 0500) Last BM Date: 10/29/20 General:   Alert,   pleasant and cooperative in NAD Abdomen: Nondistended.  Positive bowel sounds.  Minimal right upper quadrant tenderness to palpation. Extremities:  Without clubbing or edema.    Intake/Output from previous day: 06/24 0701 - 06/25 0700 In: 400 [I.V.:400] Out: -  Intake/Output this shift: No intake/output data recorded.  Lab Results: Recent Labs    10/30/20 0511 10/31/20 0742 11/01/20 0538  WBC 19.5* 21.1* 17.6*  HGB 9.9* 9.8* 9.4*  HCT 28.5* 28.5* 27.9*  PLT 253 287 251   BMET Recent Labs    10/29/20 1009 10/30/20 0415  NA 135 135  K 4.2 4.9  CL 101 102  CO2 23 22  GLUCOSE 186* 126*  BUN 16 16  CREATININE 0.81 0.73  CALCIUM 8.4* 8.0*   LFT Recent Labs    10/29/20 1637 10/30/20 0415  PROT  --  5.6*  ALBUMIN  --  2.5*  AST  --  90*  ALT  --  36  ALKPHOS  --  212*  BILITOT 2.0* 1.6*  BILIDIR 0.8*  --   IBILI 1.2*  --    PT/INR Recent Labs    10/29/20 1845 10/30/20 0806  LABPROT 32.5* 22.5*  INR 3.2* 2.0*   Hepatitis Panel Recent Labs    10/30/20 1510  HEPBSAG NON REACTIVE  HCVAB NON REACTIVE   C-Diff No results for input(s): CDIFFTOX in the last 72 hours.  Studies/Results: [place] ELASTOGRAPHY LIVER  Result Date: 10/30/2020 CLINICAL DATA:  Early cirrhosis EXAM: US LIVER ELASTOGRAPHY TECHNIQUE: Sonography of the liver was performed. In addition, ultrasound elastography evaluation of the liver was performed. A region of interest was placed within the right lobe of the liver. Following application of a  compressive sonographic pulse, tissue compressibility was assessed. Multiple assessments were performed at the selected site. Median tissue compressibility was determined. Previously, hepatic stiffness was assessed by shear wave velocity. Based on recently published Society of Radiologists in Ultrasound consensus article, reporting is now recommended to be performed in the SI units of pressure (kiloPascals) representing hepatic stiffness/elasticity. The obtained result is compared to the published reference standards. (cACLD = compensated Advanced Chronic Liver Disease) COMPARISON:  None. FINDINGS: Liver: No focal lesion identified. Heterogeneous common nodular appearance of the liver with increased parenchymal echogenicity. Portal vein is patent on color Doppler imaging with normal direction of blood flow towards the liver. ULTRASOUND HEPATIC ELASTOGRAPHY Device: Siemens Helix VTQ Patient position: Supine Transducer: 5C1 Number of measurements: 15 Hepatic segment:  8 Median kPa: 9.8 IQR: 1.6 IQR/Median kPa ratio: 0.2 Data quality:  Good Diagnostic category: >9 kPa and ?13 kPa: suggestive of cACLD, but needs further testing The use of hepatic elastography is applicable to patients with viral hepatitis and non-alcoholic fatty liver disease. At this time, there is insufficient data for the referenced cut-off values and use in other causes of liver disease, including alcoholic liver disease. Patients, however, may be assessed by elastography and serve as their own reference standard/baseline. In patients with non-alcoholic liver disease, the values suggesting compensated  advanced chronic liver disease (cACLD) may be lower, and patients may need additional testing with elasticity results of 7-9 kPa. Please note that abnormal hepatic elasticity and shear wave velocities may also be identified in clinical settings other than with hepatic fibrosis, such as: acute hepatitis, elevated right heart and central venous pressures  including use of beta blockers, veno-occlusive disease (Budd-Chiari), infiltrative processes such as mastocytosis/amyloidosis/infiltrative tumor/lymphoma, extrahepatic cholestasis, with hyperemia in the post-prandial state, and with liver transplantation. Correlation with patient history, laboratory data, and clinical condition recommended. Diagnostic Categories: < or =5 kPa: high probability of being normal < or =9 kPa: in the absence of other known clinical signs, rules out cACLD >9 kPa and ?13 kPa: suggestive of cACLD, but needs further testing >13 kPa: highly suggestive of cACLD > or =17 kPa: highly suggestive of cACLD with an increased probability of clinically significant portal hypertension IMPRESSION: ULTRASOUND LIVER: 1.  Coarse, nodular hepatic echotexture suggesting cirrhosis. 2.  Hepatic steatosis. ULTRASOUND HEPATIC ELASTOGRAPHY: Median kPa:  9.8 Diagnostic category: >9 kPa and ?13 kPa: suggestive of cACLD, but needs further testing Electronically Signed   By: Eddie Candle M.D.   On: 10/30/2020 13:58      Impression:  80 year old lady with multiple comorbidities , distant history of colon cancer, recently diagnosed with pulmonary emboli started on Xarelto;admitted to the hospital reported melena and mild decline in hemoglobin.  Mild bump in LFTs.  Chest CT suggested chronic liver disease. EGD yesterday demonstrated grade 1 esophageal varices and a small gastric polyp-removed. Temporarily bridged with heparin infusion. K PA elevated on elastography (9.8).  This is suggestive of advanced fibrosis but not conclusive..  Further evaluation may be needed.  Clinically, she remains very well compensated. Findings and EGD yesterday actually reassuring.  Recommendations:  Resume Xarelto as clinical benefits appear to outweigh the risks at this time  Once daily PPI.  Follow H&H closely  Carbohydrate modified diet.  MiraLAX for constipation.  Short interval follow-up with Dr. Collene Mares for further  GI evaluation as she deems appropriate

## 2020-11-01 NOTE — Progress Notes (Signed)
ANTICOAGULATION CONSULT NOTE - Follow Up Consult  Pharmacy Consult for heparin Indication:  PE/DVT   Labs: Recent Labs    10/29/20 1009 10/29/20 1435 10/29/20 1845 10/29/20 2224 10/30/20 0415 10/30/20 0511 10/30/20 0806 10/30/20 1510 10/31/20 0519 10/31/20 0742 10/31/20 2307 11/01/20 0538 11/01/20 0539  HGB 10.5*  --   --   --   --  9.9*  --   --   --  9.8*  --  9.4*  --   HCT 30.9*  --   --   --   --  28.5*  --   --   --  28.5*  --  27.9*  --   PLT 261  --   --   --   --  253  --   --   --  287  --  251  --   APTT  --  37*  --    < >  --   --  68*   < > 132*  --  104* 107*  --   LABPROT  --   --  32.5*  --   --   --  22.5*  --   --   --   --   --   --   INR  --   --  3.2*  --   --   --  2.0*  --   --   --   --   --   --   HEPARINUNFRC  --  >1.10*  --   --   --   --   --   --  >1.10*  --   --   --  0.81*  CREATININE 0.81  --   --   --  0.73  --   --   --   --   --   --   --   --    < > = values in this interval not displayed.     Assessment: Pharmacy consulted to dose heparin in patient for VTE treatment.  Patient recently diagnosed with left leg DVT and PE on 10/10/2020.  CT angio shows pulmonary emboli in RLL which have increased from 6/3. She is on Xarelto prior to admission with last dose 6/21 @ 2100 per patient.  Today would be day 20 from initiation of Xarelto so would still be on BID dosing.  Hgb 9.9- heme positive stool Platelets WNL 6/24 Heparin turned off at 0923 for ENDO procedure at 1400 and restarted at 1504 per GI recommendations to restart.  HL 0.81 and aPTT 107  at 0538 shortly after dose adjusted to 750units/hr at 0215.Will decrease slightly and f/u with APTT in ~8 hours  Goal of Therapy:  aPTT 66-102 seconds HL 0.3-0.7    Plan:  Decrease heparin to 700 units/hr Check APTT in ~8 hours and APTT /anti-Xa level daily. Continue to monitor H&H and platelets. F/U restart of xarelto  Isac Sarna, BS Vena [place], BCPS Clinical Pharmacist Pager  [phone] 11/01/2020,10:03 AM

## 2020-11-01 NOTE — TOC Transition Note (Signed)
Transition of Care [facility]) - CM/SW Discharge Note   Patient Details  Name: Melissa Reynolds MRN: [id number] Date of Birth: 1940-12-29  [facility] CM/SW Contact:  Natasha Bence, LCSW Phone Number: 11/01/2020, 12:57 PM   Clinical Narrative:    CSW notified of patient's readiness for discharge. CSW notified Corene Cornea with [facility]. Corene Cornea agreeable to provide [facility] services. TOC signing off.    Final next level of care: [place] Barriers to Discharge: Barriers Resolved   Patient Goals and CMS Choice Patient states their goals for this hospitalization and ongoing recovery are:: Return home with [facility] CMS Medicare.gov Compare Post Acute Care list provided to:: Patient Choice offered to / list presented to : Patient  Discharge Placement                    Patient and family notified of of transfer: 11/01/20  Discharge Plan and Services                          HH Arranged: RN, PT [facility] Agency: [place] ([place]) Date [place]: 11/01/20 Time [place]: 1257 Representative spoke with at [place]: [place] ([place]) Interventions     Readmission Risk Interventions No flowsheet data found.

## 2020-11-02 LAB — HEPATITIS B SURFACE ANTIBODY, QUANTITATIVE: Hep B S AB Quant (Post): <3.1 m[IU]/mL — ABNORMAL LOW (ref >9.9)

## 2020-11-03 ENCOUNTER — Encounter (HOSPITAL_COMMUNITY): Payer: Self-pay | Admitting: Internal Medicine

## 2020-11-03 ENCOUNTER — Other Ambulatory Visit: Payer: Self-pay | Admitting: Cardiology

## 2020-11-04 LAB — SURGICAL PATHOLOGY

## 2020-11-05 ENCOUNTER — Encounter: Payer: Self-pay | Admitting: Internal Medicine

## 2020-11-12 ENCOUNTER — Other Ambulatory Visit: Payer: Self-pay

## 2020-11-12 DIAGNOSIS — I25118 Atherosclerotic heart disease of native coronary artery with other forms of angina pectoris: Secondary | ICD-10-CM

## 2020-11-13 ENCOUNTER — Other Ambulatory Visit: Payer: Medicare Other

## 2020-11-15 ENCOUNTER — Emergency Department (HOSPITAL_COMMUNITY): Payer: Medicare Other

## 2020-11-15 ENCOUNTER — Encounter (HOSPITAL_COMMUNITY): Payer: Self-pay

## 2020-11-15 ENCOUNTER — Inpatient Hospital Stay (HOSPITAL_COMMUNITY)
Admission: EM | Admit: 2020-11-15 | Discharge: 2020-12-08 | DRG: 436 | Disposition: E | Payer: Medicare Other | Attending: Internal Medicine | Admitting: Internal Medicine

## 2020-11-15 ENCOUNTER — Other Ambulatory Visit: Payer: Self-pay

## 2020-11-15 DIAGNOSIS — Z833 Family history of diabetes mellitus: Secondary | ICD-10-CM | POA: Diagnosis not present

## 2020-11-15 DIAGNOSIS — Z809 Family history of malignant neoplasm, unspecified: Secondary | ICD-10-CM | POA: Diagnosis not present

## 2020-11-15 DIAGNOSIS — Z515 Encounter for palliative care: Secondary | ICD-10-CM | POA: Diagnosis not present

## 2020-11-15 DIAGNOSIS — Z20822 Contact with and (suspected) exposure to covid-19: Secondary | ICD-10-CM | POA: Diagnosis present

## 2020-11-15 DIAGNOSIS — E1165 Type 2 diabetes mellitus with hyperglycemia: Secondary | ICD-10-CM | POA: Diagnosis present

## 2020-11-15 DIAGNOSIS — K5900 Constipation, unspecified: Secondary | ICD-10-CM | POA: Diagnosis present

## 2020-11-15 DIAGNOSIS — M069 Rheumatoid arthritis, unspecified: Secondary | ICD-10-CM | POA: Diagnosis present

## 2020-11-15 DIAGNOSIS — R748 Abnormal levels of other serum enzymes: Secondary | ICD-10-CM | POA: Diagnosis not present

## 2020-11-15 DIAGNOSIS — I2510 Atherosclerotic heart disease of native coronary artery without angina pectoris: Secondary | ICD-10-CM | POA: Diagnosis present

## 2020-11-15 DIAGNOSIS — F419 Anxiety disorder, unspecified: Secondary | ICD-10-CM | POA: Diagnosis present

## 2020-11-15 DIAGNOSIS — E785 Hyperlipidemia, unspecified: Secondary | ICD-10-CM | POA: Diagnosis present

## 2020-11-15 DIAGNOSIS — E875 Hyperkalemia: Secondary | ICD-10-CM | POA: Diagnosis not present

## 2020-11-15 DIAGNOSIS — Z85038 Personal history of other malignant neoplasm of large intestine: Secondary | ICD-10-CM

## 2020-11-15 DIAGNOSIS — I10 Essential (primary) hypertension: Secondary | ICD-10-CM | POA: Diagnosis not present

## 2020-11-15 DIAGNOSIS — Z86718 Personal history of other venous thrombosis and embolism: Secondary | ICD-10-CM | POA: Diagnosis not present

## 2020-11-15 DIAGNOSIS — C7802 Secondary malignant neoplasm of left lung: Secondary | ICD-10-CM | POA: Diagnosis present

## 2020-11-15 DIAGNOSIS — G40909 Epilepsy, unspecified, not intractable, without status epilepticus: Secondary | ICD-10-CM | POA: Diagnosis present

## 2020-11-15 DIAGNOSIS — C787 Secondary malignant neoplasm of liver and intrahepatic bile duct: Secondary | ICD-10-CM | POA: Diagnosis present

## 2020-11-15 DIAGNOSIS — Z90710 Acquired absence of both cervix and uterus: Secondary | ICD-10-CM

## 2020-11-15 DIAGNOSIS — I82502 Chronic embolism and thrombosis of unspecified deep veins of left lower extremity: Secondary | ICD-10-CM | POA: Diagnosis present

## 2020-11-15 DIAGNOSIS — R160 Hepatomegaly, not elsewhere classified: Secondary | ICD-10-CM | POA: Diagnosis not present

## 2020-11-15 DIAGNOSIS — R627 Adult failure to thrive: Secondary | ICD-10-CM | POA: Diagnosis present

## 2020-11-15 DIAGNOSIS — F32A Depression, unspecified: Secondary | ICD-10-CM | POA: Diagnosis present

## 2020-11-15 DIAGNOSIS — I25118 Atherosclerotic heart disease of native coronary artery with other forms of angina pectoris: Secondary | ICD-10-CM | POA: Diagnosis present

## 2020-11-15 DIAGNOSIS — Z66 Do not resuscitate: Secondary | ICD-10-CM | POA: Diagnosis present

## 2020-11-15 DIAGNOSIS — I5032 Chronic diastolic (congestive) heart failure: Secondary | ICD-10-CM | POA: Diagnosis present

## 2020-11-15 DIAGNOSIS — D6859 Other primary thrombophilia: Secondary | ICD-10-CM | POA: Diagnosis present

## 2020-11-15 DIAGNOSIS — E860 Dehydration: Secondary | ICD-10-CM | POA: Diagnosis present

## 2020-11-15 DIAGNOSIS — Z7901 Long term (current) use of anticoagulants: Secondary | ICD-10-CM

## 2020-11-15 DIAGNOSIS — E119 Type 2 diabetes mellitus without complications: Secondary | ICD-10-CM

## 2020-11-15 DIAGNOSIS — Z9049 Acquired absence of other specified parts of digestive tract: Secondary | ICD-10-CM

## 2020-11-15 DIAGNOSIS — M1990 Unspecified osteoarthritis, unspecified site: Secondary | ICD-10-CM | POA: Diagnosis present

## 2020-11-15 DIAGNOSIS — I2699 Other pulmonary embolism without acute cor pulmonale: Secondary | ICD-10-CM | POA: Diagnosis present

## 2020-11-15 DIAGNOSIS — Z86711 Personal history of pulmonary embolism: Secondary | ICD-10-CM

## 2020-11-15 DIAGNOSIS — C189 Malignant neoplasm of colon, unspecified: Secondary | ICD-10-CM

## 2020-11-15 DIAGNOSIS — I110 Hypertensive heart disease with heart failure: Secondary | ICD-10-CM | POA: Diagnosis present

## 2020-11-15 DIAGNOSIS — Z7984 Long term (current) use of oral hypoglycemic drugs: Secondary | ICD-10-CM

## 2020-11-15 DIAGNOSIS — C7801 Secondary malignant neoplasm of right lung: Secondary | ICD-10-CM | POA: Diagnosis present

## 2020-11-15 DIAGNOSIS — Z794 Long term (current) use of insulin: Secondary | ICD-10-CM

## 2020-11-15 DIAGNOSIS — N179 Acute kidney failure, unspecified: Secondary | ICD-10-CM | POA: Diagnosis present

## 2020-11-15 DIAGNOSIS — Z79899 Other long term (current) drug therapy: Secondary | ICD-10-CM

## 2020-11-15 DIAGNOSIS — G893 Neoplasm related pain (acute) (chronic): Secondary | ICD-10-CM | POA: Diagnosis present

## 2020-11-15 DIAGNOSIS — K219 Gastro-esophageal reflux disease without esophagitis: Secondary | ICD-10-CM | POA: Diagnosis present

## 2020-11-15 LAB — CBC WITH DIFFERENTIAL/PLATELET
Abs Immature Granulocytes: 0.11 10*3/uL — ABNORMAL HIGH (ref 0.00–0.07)
Basophils Absolute: 0 10*3/uL (ref 0.0–0.1)
Basophils Relative: 0 %
Eosinophils Absolute: 0 10*3/uL (ref 0.0–0.5)
Eosinophils Relative: 0 %
HCT: 32.2 % — ABNORMAL LOW (ref 36.0–46.0)
Hemoglobin: 11.5 g/dL — ABNORMAL LOW (ref 12.0–15.0)
Immature Granulocytes: 1 %
Lymphocytes Relative: 14 %
Lymphs Abs: 2.1 10*3/uL (ref 0.7–4.0)
MCH: 27.3 pg (ref 26.0–34.0)
MCHC: 35.7 g/dL (ref 30.0–36.0)
MCV: 76.3 fL — ABNORMAL LOW (ref 80.0–100.0)
Monocytes Absolute: 1.7 10*3/uL — ABNORMAL HIGH (ref 0.1–1.0)
Monocytes Relative: 12 %
Neutro Abs: 10.6 10*3/uL — ABNORMAL HIGH (ref 1.7–7.7)
Neutrophils Relative %: 73 %
Platelets: 194 10*3/uL (ref 150–400)
RBC: 4.22 MIL/uL (ref 3.87–5.11)
RDW: 19.3 % — ABNORMAL HIGH (ref 11.5–15.5)
WBC: 14.6 10*3/uL — ABNORMAL HIGH (ref 4.0–10.5)

## 2020-11-15 LAB — URINALYSIS, ROUTINE W REFLEX MICROSCOPIC
Bacteria, UA: NONE SEEN
Bilirubin Urine: NEGATIVE
Glucose, UA: NEGATIVE mg/dL
Ketones, ur: NEGATIVE mg/dL
Leukocytes,Ua: NEGATIVE
Nitrite: NEGATIVE
Protein, ur: 100 mg/dL — AB
Specific Gravity, Urine: 1.024 (ref 1.005–1.030)
pH: 5 (ref 5.0–8.0)

## 2020-11-15 LAB — COMPREHENSIVE METABOLIC PANEL
ALT: 169 U/L — ABNORMAL HIGH (ref 0–44)
AST: 698 U/L — ABNORMAL HIGH (ref 15–41)
Albumin: 2.3 g/dL — ABNORMAL LOW (ref 3.5–5.0)
Alkaline Phosphatase: 543 U/L — ABNORMAL HIGH (ref 38–126)
Anion gap: 15 (ref 5–15)
BUN: 45 mg/dL — ABNORMAL HIGH (ref 8–23)
CO2: 16 mmol/L — ABNORMAL LOW (ref 22–32)
Calcium: 7.9 mg/dL — ABNORMAL LOW (ref 8.9–10.3)
Chloride: 101 mmol/L (ref 98–111)
Creatinine, Ser: 1.61 mg/dL — ABNORMAL HIGH (ref 0.44–1.00)
GFR, Estimated: 32 mL/min — ABNORMAL LOW (ref >60)
Glucose, Bld: 197 mg/dL — ABNORMAL HIGH (ref 70–99)
Potassium: 5.9 mmol/L — ABNORMAL HIGH (ref 3.5–5.1)
Sodium: 132 mmol/L — ABNORMAL LOW (ref 135–145)
Total Bilirubin: 3.6 mg/dL — ABNORMAL HIGH (ref 0.3–1.2)
Total Protein: 6.1 g/dL — ABNORMAL LOW (ref 6.5–8.1)

## 2020-11-15 LAB — BRAIN NATRIURETIC PEPTIDE: B Natriuretic Peptide: 180 pg/mL — ABNORMAL HIGH (ref 0.0–100.0)

## 2020-11-15 LAB — HEPARIN LEVEL (UNFRACTIONATED)
Heparin Unfractionated: >1.1 IU/mL — ABNORMAL HIGH (ref 0.30–0.70)
Heparin Unfractionated: >1.1 IU/mL — ABNORMAL HIGH (ref 0.30–0.70)

## 2020-11-15 LAB — AMMONIA: Ammonia: 45 umol/L — ABNORMAL HIGH (ref 9–35)

## 2020-11-15 LAB — CBG MONITORING, ED: Glucose-Capillary: 140 mg/dL — ABNORMAL HIGH (ref 70–99)

## 2020-11-15 LAB — APTT
aPTT: 48 seconds — ABNORMAL HIGH (ref 24–36)
aPTT: 74 seconds — ABNORMAL HIGH (ref 24–36)
aPTT: 87 seconds — ABNORMAL HIGH (ref 24–36)

## 2020-11-15 LAB — TROPONIN I (HIGH SENSITIVITY)
Troponin I (High Sensitivity): 67 ng/L — ABNORMAL HIGH (ref <18)
Troponin I (High Sensitivity): 68 ng/L — ABNORMAL HIGH (ref <18)

## 2020-11-15 LAB — MAGNESIUM: Magnesium: 1.9 mg/dL (ref 1.7–2.4)

## 2020-11-15 MED ORDER — SODIUM CHLORIDE 0.9 % IV BOLUS
1000.0000 mL | Freq: Once | INTRAVENOUS | Status: AC
  Administered 2020-11-15: 1000 mL via INTRAVENOUS

## 2020-11-15 MED ORDER — SODIUM CHLORIDE 0.9 % IV SOLN: INTRAVENOUS | Status: DC

## 2020-11-15 MED ORDER — SODIUM ZIRCONIUM CYCLOSILICATE 10 G PO PACK
10.0000 g | PACK | Freq: Three times a day (TID) | ORAL | Status: AC
  Administered 2020-11-15 (×2): 10 g via ORAL
  Filled 2020-11-15 (×2): qty 1
  Filled 2020-11-15: qty 2

## 2020-11-15 MED ORDER — IOHEXOL 300 MG/ML  SOLN
75.0000 mL | Freq: Once | INTRAMUSCULAR | Status: AC | PRN
  Administered 2020-11-15: 75 mL via INTRAVENOUS

## 2020-11-15 MED ORDER — HEPARIN (PORCINE) 25000 UT/250ML-% IV SOLN
800.0000 [IU]/h | INTRAVENOUS | Status: DC
  Administered 2020-11-15: 800 [IU]/h via INTRAVENOUS
  Filled 2020-11-15 (×3): qty 250

## 2020-11-15 MED ORDER — ONDANSETRON HCL 4 MG/2ML IJ SOLN: 4.0000 mg | Freq: Four times a day (QID) | INTRAMUSCULAR | Status: DC | PRN

## 2020-11-15 MED ORDER — LACTATED RINGERS IV BOLUS
1000.0000 mL | Freq: Once | INTRAVENOUS | Status: AC
  Administered 2020-11-15: 1000 mL via INTRAVENOUS

## 2020-11-15 MED ORDER — ENSURE ENLIVE PO LIQD
237.0000 mL | Freq: Two times a day (BID) | ORAL | Status: DC
  Administered 2020-11-16 – 2020-11-17 (×2): 237 mL via ORAL

## 2020-11-15 MED ORDER — METOPROLOL TARTRATE 25 MG PO TABS
25.0000 mg | ORAL_TABLET | Freq: Two times a day (BID) | ORAL | Status: DC
  Administered 2020-11-15 – 2020-11-16 (×3): 25 mg via ORAL
  Filled 2020-11-15 (×3): qty 1

## 2020-11-15 MED ORDER — ISOSORBIDE MONONITRATE ER 60 MG PO TB24
60.0000 mg | ORAL_TABLET | Freq: Every day | ORAL | Status: DC
  Administered 2020-11-15 – 2020-11-17 (×3): 60 mg via ORAL
  Filled 2020-11-15 (×3): qty 1

## 2020-11-15 MED ORDER — ONDANSETRON HCL 4 MG PO TABS: 4.0000 mg | ORAL_TABLET | Freq: Four times a day (QID) | ORAL | Status: DC | PRN

## 2020-11-15 MED ORDER — OXYCODONE HCL 5 MG PO TABS
5.0000 mg | ORAL_TABLET | ORAL | Status: DC | PRN
  Administered 2020-11-15 – 2020-11-18 (×6): 5 mg via ORAL
  Filled 2020-11-15 (×6): qty 1

## 2020-11-15 MED ORDER — NITROGLYCERIN 0.4 MG SL SUBL: 0.4000 mg | SUBLINGUAL_TABLET | SUBLINGUAL | Status: DC | PRN

## 2020-11-15 MED ORDER — FENTANYL CITRATE (PF) 100 MCG/2ML IJ SOLN
12.5000 ug | INTRAMUSCULAR | Status: DC | PRN
  Administered 2020-11-16 – 2020-11-18 (×2): 50 ug via INTRAVENOUS
  Filled 2020-11-15 (×2): qty 2

## 2020-11-15 MED ORDER — CALCIUM GLUCONATE-NACL 1-0.675 GM/50ML-% IV SOLN
1.0000 g | Freq: Once | INTRAVENOUS | Status: AC
  Administered 2020-11-15: 1000 mg via INTRAVENOUS
  Filled 2020-11-15: qty 50

## 2020-11-15 MED ORDER — BISACODYL 5 MG PO TBEC: 5.0000 mg | DELAYED_RELEASE_TABLET | Freq: Every day | ORAL | Status: DC | PRN

## 2020-11-15 MED ORDER — ROSUVASTATIN CALCIUM 5 MG PO TABS
5.0000 mg | ORAL_TABLET | Freq: Every day | ORAL | Status: DC
  Administered 2020-11-16: 5 mg via ORAL
  Filled 2020-11-15: qty 1

## 2020-11-15 MED ORDER — LEVETIRACETAM 500 MG PO TABS
750.0000 mg | ORAL_TABLET | Freq: Two times a day (BID) | ORAL | Status: DC
  Administered 2020-11-15 – 2020-11-16 (×2): 750 mg via ORAL
  Filled 2020-11-15 (×2): qty 1

## 2020-11-15 MED ORDER — SODIUM BICARBONATE 8.4 % IV SOLN
50.0000 meq | Freq: Once | INTRAVENOUS | Status: AC
  Administered 2020-11-15: 50 meq via INTRAVENOUS
  Filled 2020-11-15: qty 50

## 2020-11-15 NOTE — ED Notes (Signed)
Grip socks
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
## 2020-11-15 NOTE — ED Triage Notes (Signed)
Pt from home, brought in by EMS for weakness, originally called out for fall- pt was lying face down, no injuries; however pt has had increasing weakness since discharged from here end June for blood clots in lungs. Pt is also not eating per family.

## 2020-11-15 NOTE — ED Provider Notes (Signed)
Signout note  80 year old lady presented to ER with concern for weakness.  Recent admission for PE.  Discharged on Eliquis.  Also at last admission concern for possible GI bleed and gastroenterology was consulted.  Had melena.  Ultrasound with cirrhosis.  EGD was reassuring. Dc home on eliquis.  Labs today concerning for transaminitis.  CT scan ordered to further evaluate.  8:32 AM discussed case with Dr. Laural Golden who is quite familiar with the case.  States feels this is unlikely to be related to her colon cancer from 20 years ago, likely new cancer.  Recommends admission, likely will need biopsy.  If patient admitted to [facility], he would be happy to see but may need to be admitted to [facility].  Will discuss further with hospitalist.  CT also commented on possible pyelonephritis, will await urinalysis and contact hospitalist.  RN to perform in and out cath.  Will update pt on concerning findings.   9:35 AM urinalysis negative for UTI.  Have updated patient regarding CT findings.  Placed consult to hospitalist for admission   Lucrezia Starch, MD 11/09/2020 [phone]

## 2020-11-15 NOTE — ED Notes (Signed)
Hospitalist in room at this time to assess patient.

## 2020-11-15 NOTE — Progress Notes (Signed)
ANTICOAGULATION CONSULT NOTE  Pharmacy Consult for heparin Indication: Recent pulmonary embolism/DVT  Allergies  Allergen Reactions   Sulfa Antibiotics Rash    Patient Measurements: Height: 5\' 5"  (165.1 cm) Weight: 89.3 kg (196 lb 13.9 oz) IBW/kg (Calculated) : 57 Heparin Dosing Weight: 76 kg  Vital Signs: Temp: 97.5 F (36.4 C) (07/09 2210) Temp Source: Oral (07/09 2210) BP: 123/65 (07/09 2210) Pulse Rate: 77 (07/09 2210)  Labs: Recent Labs    11/24/2020 0517 11/24/2020 0741 12/07/2020 1223 11/17/2020 2040 11/24/2020 2136  HGB 11.5*  --   --   --   --   HCT 32.2*  --   --   --   --   PLT 194  --   --   --   --   APTT  --   --  48*  --  87*  HEPARINUNFRC  --   --  >1.10* >1.10*  --   CREATININE 1.61*  --   --   --   --   TROPONINIHS 67* 68*  --   --   --      Estimated Creatinine Clearance: 29.7 mL/min (A) (by C-G formula based on SCr of 1.61 mg/dL (H)).   Medical History: Past Medical History:  Diagnosis Date   Arthritis    rheumatoid   Colon cancer ([place]) 03/2000   stage 3 adenocarcinoma   Colon cancer ([place]) 08/20/2015   Coronary artery disease    Diabetes mellitus    DVT (deep venous thrombosis) (HCC)    Heartburn    Hypertension    Obesity    Osteoarthritis    RA (rheumatoid arthritis) (HCC)    Syncope and collapse     Medications:  Medications Prior to Admission  Medication Sig Dispense Refill Last Dose   acetaminophen (TYLENOL) 500 MG tablet Take 1,000 mg by mouth every 6 (six) hours as needed for moderate pain or headache.      amLODipine (NORVASC) 5 MG tablet Take 5 mg by mouth 2 (two) times daily.    11/14/2020   apixaban (ELIQUIS) 5 MG TABS tablet Take 1 tablet (5 mg total) by mouth 2 (two) times daily. 60 tablet 3 11/14/2020 at 2000   cholecalciferol (VITAMIN D) 25 MCG (1000 UT) tablet Take 2,000 Units by mouth daily.    11/14/2020   empagliflozin (JARDIANCE) 10 MG TABS tablet Take 1 tablet (10 mg total) by mouth daily before breakfast. 90 tablet 2  06/11/252   folic acid (FOLVITE) 1 MG tablet Take 1 mg by mouth daily.   11/14/2020   gabapentin (NEURONTIN) 100 MG capsule Take 100 mg by mouth at bedtime.   11/14/2020   hydrochlorothiazide (HYDRODIURIL) 25 MG tablet Take 25 mg by mouth daily.    11/14/2020   HYDROcodone-acetaminophen (NORCO) 10-325 MG tablet Take 1 tablet by mouth every 6 (six) hours as needed.      Insulin Glargine (BASAGLAR KWIKPEN) 100 UNIT/ML Inject 5 Units into the skin at bedtime.   11/14/2020   isosorbide mononitrate (IMDUR) 60 MG 24 hr tablet Take 1 tablet by mouth once daily 90 tablet 0 11/14/2020   levETIRAcetam (KEPPRA) 750 MG tablet Take 750 mg by mouth 2 (two) times daily.   11/14/2020   megestrol (MEGACE) 40 MG/ML suspension Take 10 mLs by mouth in the morning and at bedtime.   11/14/2020   metFORMIN (GLUCOPHAGE) 500 MG tablet Take 500 mg by mouth 2 (two) times daily with a meal.   11/14/2020   metoprolol tartrate (  LOPRESSOR) 50 MG tablet Take 1 tablet by mouth twice daily 180 tablet 0 11/14/2020 at 2000   nitroGLYCERIN (NITROSTAT) 0.4 MG SL tablet Place 0.4 mg under the tongue every 5 (five) minutes x 3 doses as needed.   unk at unk   ondansetron (ZOFRAN) 4 MG tablet Take 1 tablet (4 mg total) by mouth every 6 (six) hours as needed for nausea or vomiting. 12 tablet 0    pantoprazole (PROTONIX) 40 MG tablet Take 1 tablet by mouth once daily 90 tablet 0 11/14/2020   polyethylene glycol (MIRALAX / GLYCOLAX) 17 g packet Take 17 g by mouth daily. 28 each 1 11/14/2020   potassium chloride SA (KLOR-CON) 20 MEQ tablet Take 1 tablet (20 mEq total) by mouth 2 (two) times daily. 10 tablet 0 11/14/2020   rosuvastatin (CRESTOR) 5 MG tablet Take 1 tablet (5 mg total) by mouth daily. Hold until follow up with PCP   11/14/2020   spironolactone (ALDACTONE) 25 MG tablet Take 0.5 tablets (12.5 mg total) by mouth daily. 60 tablet 0 11/14/2020    Assessment: Pharmacy consulted to dose heparin in patient with recent pulmonary embolism/DVT on 6/3 (discharged  6/25). On apixaban prior to admission with last dose 7/8 2000.  Will need to monitor based on aPTT until correlation with heparin levels.  11/21/2020: Initial aPTT 87 sec- therapeutic on heparin infusion at 800 units/hr Heparin level >1.1- elevated as anticipated due to interaction with Eliquis.   CBC- Hg slightly low at 11.5, Pltc WNL No bleeding or infusion related concerns per nursing.   Goal of Therapy:  Heparin level 0.3-0.7 units/ml aPTT 66-102 seconds Monitor platelets by anticoagulation protocol: Yes   Plan:  Continue heparin infusion at 800 units/hr Repeat aPTT in 8 hours  Daily aPTT/heparin level & CBC while on heparin  Netta Cedars, PharmD Clinical Pharmacist 11/30/2020 11:38 PM

## 2020-11-15 NOTE — ED Notes (Signed)
When RN and I pulled pt up in bed she requested stocks. Blue socks given

## 2020-11-15 NOTE — Progress Notes (Addendum)
ANTICOAGULATION CONSULT NOTE - Initial Consult  Pharmacy Consult for heparin Indication: Recent pulmonary embolism/DVT  Allergies  Allergen Reactions   Sulfa Antibiotics Rash    Patient Measurements: Height: 5\' 5"  (165.1 cm) Weight: 89.3 kg (196 lb 13.9 oz) IBW/kg (Calculated) : 57 Heparin Dosing Weight: 76 kg  Vital Signs: Temp: 100.3 F (37.9 C) (07/09 0530) Temp Source: Rectal (07/09 0530) BP: 115/52 (07/09 1000) Pulse Rate: 50 (07/09 1000)  Labs: Recent Labs    12/05/2020 0517 11/28/2020 0741  HGB 11.5*  --   HCT 32.2*  --   PLT 194  --   CREATININE 1.61*  --   TROPONINIHS 67* 68*    Estimated Creatinine Clearance: 29.7 mL/min (A) (by C-G formula based on SCr of 1.61 mg/dL (H)).   Medical History: Past Medical History:  Diagnosis Date   Arthritis    rheumatoid   Colon cancer ([place]) 03/2000   stage 3 adenocarcinoma   Colon cancer ([place]) 08/20/2015   Coronary artery disease    Diabetes mellitus    DVT (deep venous thrombosis) (HCC)    Heartburn    Hypertension    Obesity    Osteoarthritis    RA (rheumatoid arthritis) (HCC)    Syncope and collapse     Medications:  (Not in a hospital admission)   Assessment: Pharmacy consulted to dose heparin in patient with recent pulmonary embolism/DVT on 6/3 (discharged 6/25). On apixaban prior to admission with last dose 7/8 2000.  Will need to monitor based on aPTT until correlation with heparin levels.  Goal of Therapy:  Heparin level 0.3-0.7 units/ml aPTT 66-102 seconds Monitor platelets by anticoagulation protocol: Yes   Plan:  Start heparin infusion at 800 units/hr Check aPTT in 8 hours and aPTT/heparin level daily Continue to monitor H&H and platelets.   Margot Ables, PharmD Clinical Pharmacist 11/27/2020 12:28 PM

## 2020-11-15 NOTE — ED Notes (Signed)
[facility] here to transport patient.  

## 2020-11-15 NOTE — Plan of Care (Signed)
  Problem: Skin Integrity: Goal: Risk for impaired skin integrity will decrease Outcome: Progressing   

## 2020-11-16 DIAGNOSIS — C189 Malignant neoplasm of colon, unspecified: Secondary | ICD-10-CM | POA: Diagnosis not present

## 2020-11-16 DIAGNOSIS — R160 Hepatomegaly, not elsewhere classified: Secondary | ICD-10-CM | POA: Diagnosis not present

## 2020-11-16 LAB — COMPREHENSIVE METABOLIC PANEL
ALT: 128 U/L — ABNORMAL HIGH (ref 0–44)
AST: 474 U/L — ABNORMAL HIGH (ref 15–41)
Albumin: 1.9 g/dL — ABNORMAL LOW (ref 3.5–5.0)
Alkaline Phosphatase: 443 U/L — ABNORMAL HIGH (ref 38–126)
Anion gap: 12 (ref 5–15)
BUN: 48 mg/dL — ABNORMAL HIGH (ref 8–23)
CO2: 18 mmol/L — ABNORMAL LOW (ref 22–32)
Calcium: 7.4 mg/dL — ABNORMAL LOW (ref 8.9–10.3)
Chloride: 108 mmol/L (ref 98–111)
Creatinine, Ser: 1.55 mg/dL — ABNORMAL HIGH (ref 0.44–1.00)
GFR, Estimated: 33 mL/min — ABNORMAL LOW (ref >60)
Glucose, Bld: 112 mg/dL — ABNORMAL HIGH (ref 70–99)
Potassium: 5.5 mmol/L — ABNORMAL HIGH (ref 3.5–5.1)
Sodium: 138 mmol/L (ref 135–145)
Total Bilirubin: 3.6 mg/dL — ABNORMAL HIGH (ref 0.3–1.2)
Total Protein: 5.6 g/dL — ABNORMAL LOW (ref 6.5–8.1)

## 2020-11-16 LAB — CBC
HCT: 29.9 % — ABNORMAL LOW (ref 36.0–46.0)
Hemoglobin: 10.8 g/dL — ABNORMAL LOW (ref 12.0–15.0)
MCH: 26.7 pg (ref 26.0–34.0)
MCHC: 36.1 g/dL — ABNORMAL HIGH (ref 30.0–36.0)
MCV: 73.8 fL — ABNORMAL LOW (ref 80.0–100.0)
Platelets: 176 10*3/uL (ref 150–400)
RBC: 4.05 MIL/uL (ref 3.87–5.11)
RDW: 18.6 % — ABNORMAL HIGH (ref 11.5–15.5)
WBC: 15.1 10*3/uL — ABNORMAL HIGH (ref 4.0–10.5)
nRBC: 0.7 % — ABNORMAL HIGH (ref 0.0–0.2)

## 2020-11-16 LAB — HEPARIN LEVEL (UNFRACTIONATED): Heparin Unfractionated: >1.1 IU/mL — ABNORMAL HIGH (ref 0.30–0.70)

## 2020-11-16 LAB — PROTIME-INR
INR: 3.6 — ABNORMAL HIGH (ref 0.8–1.2)
Prothrombin Time: 35.9 seconds — ABNORMAL HIGH (ref 11.4–15.2)

## 2020-11-16 LAB — MAGNESIUM: Magnesium: 1.8 mg/dL (ref 1.7–2.4)

## 2020-11-16 LAB — APTT: aPTT: 99 seconds — ABNORMAL HIGH (ref 24–36)

## 2020-11-16 MED ORDER — GLYCOPYRROLATE 0.2 MG/ML IJ SOLN
0.2000 mg | INTRAMUSCULAR | Status: DC | PRN
Start: 2020-11-16 — End: 2020-11-19
  Filled 2020-11-16: qty 1

## 2020-11-16 MED ORDER — MORPHINE SULFATE (PF) 4 MG/ML IV SOLN
2.0000 mg | INTRAVENOUS | Status: DC | PRN
  Administered 2020-11-16 – 2020-11-18 (×5): 2 mg via INTRAVENOUS
  Filled 2020-11-16 (×5): qty 1

## 2020-11-16 MED ORDER — ACETAMINOPHEN 325 MG PO TABS: 650.0000 mg | ORAL_TABLET | Freq: Four times a day (QID) | ORAL | Status: DC | PRN

## 2020-11-16 MED ORDER — HALOPERIDOL LACTATE 5 MG/ML IJ SOLN: 0.5000 mg | INTRAMUSCULAR | Status: DC | PRN

## 2020-11-16 MED ORDER — BIOTENE DRY MOUTH MT LIQD
15.0000 mL | OROMUCOSAL | Status: DC | PRN
Start: 2020-11-16 — End: 2020-11-19

## 2020-11-16 MED ORDER — GLYCOPYRROLATE 1 MG PO TABS
1.0000 mg | ORAL_TABLET | ORAL | Status: DC | PRN
Start: 2020-11-16 — End: 2020-11-19
  Filled 2020-11-16: qty 1

## 2020-11-16 MED ORDER — ACETAMINOPHEN 650 MG RE SUPP
650.0000 mg | Freq: Four times a day (QID) | RECTAL | Status: DC | PRN
Start: 2020-11-16 — End: 2020-11-19

## 2020-11-16 MED ORDER — LIP MEDEX EX OINT
TOPICAL_OINTMENT | CUTANEOUS | Status: AC
  Filled 2020-11-16: qty 7

## 2020-11-16 MED ORDER — POLYVINYL ALCOHOL 1.4 % OP SOLN
1.0000 [drp] | Freq: Four times a day (QID) | OPHTHALMIC | Status: DC | PRN
Start: 2020-11-16 — End: 2020-11-19
  Filled 2020-11-16: qty 15

## 2020-11-16 MED ORDER — GLYCOPYRROLATE 0.2 MG/ML IJ SOLN
0.2000 mg | INTRAMUSCULAR | Status: DC | PRN
  Filled 2020-11-16: qty 1

## 2020-11-16 MED ORDER — ONDANSETRON 4 MG PO TBDP
4.0000 mg | ORAL_TABLET | Freq: Four times a day (QID) | ORAL | Status: DC | PRN
Start: 2020-11-16 — End: 2020-11-19

## 2020-11-16 MED ORDER — HALOPERIDOL LACTATE 2 MG/ML PO CONC
0.5000 mg | ORAL | Status: DC | PRN
  Filled 2020-11-16: qty 0.3

## 2020-11-16 MED ORDER — HALOPERIDOL 0.5 MG PO TABS
0.5000 mg | ORAL_TABLET | ORAL | Status: DC | PRN
  Filled 2020-11-16: qty 1

## 2020-11-16 MED ORDER — ONDANSETRON HCL 4 MG/2ML IJ SOLN
4.0000 mg | Freq: Four times a day (QID) | INTRAMUSCULAR | Status: DC | PRN
Start: 2020-11-16 — End: 2020-11-19

## 2020-11-16 NOTE — Assessment & Plan Note (Addendum)
-   A1c 6.6% on 10/29/2020 - Continue diet control

## 2020-11-16 NOTE — Consult Note (Signed)
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 article, reporting is now recommended to be performed in the SI units of pressure (kiloPascals) representing hepatic stiffness/elasticity. The obtained result is compared to the published reference standards. (cACLD = compensated Advanced Chronic Liver Disease) COMPARISON:  None. FINDINGS: Liver: No focal lesion identified. Heterogeneous common nodular appearance of the liver with increased parenchymal echogenicity. Portal vein is patent on color Doppler imaging with normal direction of blood flow towards the liver. ULTRASOUND HEPATIC ELASTOGRAPHY Device: Siemens Helix VTQ Patient position: Supine Transducer: 5C1 Number of measurements: 15 Hepatic segment:  8 Median kPa: 9.8 IQR: 1.6 IQR/Median kPa ratio: 0.2 Data quality:  Good Diagnostic category: >9 kPa and ?13 kPa: suggestive of cACLD, but needs further testing The use of hepatic elastography is applicable to patients with viral hepatitis and non-alcoholic  fatty liver disease. At this time, there is insufficient data for the referenced cut-off values and use in other causes of liver disease, including alcoholic liver disease. Patients, however, may be assessed by elastography and serve as their own reference standard/baseline. In patients with non-alcoholic liver disease, the values suggesting compensated advanced chronic liver disease (cACLD) may be lower, and patients may need additional testing with elasticity results of 7-9 kPa. Please note that abnormal hepatic elasticity and shear wave velocities may also be identified in clinical settings other than with hepatic fibrosis, such as: acute hepatitis, elevated right heart and central venous pressures including use of beta blockers, veno-occlusive disease (Budd-Chiari), infiltrative processes such as mastocytosis/amyloidosis/infiltrative tumor/lymphoma, extrahepatic cholestasis, with hyperemia in the post-prandial state, and with liver transplantation. Correlation with patient history, laboratory data, and clinical condition recommended. Diagnostic Categories: < or =5 kPa: high probability of being normal < or =9 kPa: in the absence of other known clinical signs, rules out cACLD >9 kPa and ?13 kPa: suggestive of cACLD, but needs further testing >13 kPa: highly suggestive of cACLD > or =17 kPa: highly suggestive of cACLD with an increased probability of clinically significant portal hypertension IMPRESSION: ULTRASOUND LIVER: 1.  Coarse, nodular hepatic echotexture suggesting cirrhosis. 2.  Hepatic steatosis. ULTRASOUND HEPATIC ELASTOGRAPHY:  Median kPa:  9.8 Diagnostic category: >9 kPa and ?13 kPa: suggestive of cACLD, but needs further testing Electronically Signed   By: Eddie Candle M.D.   On: 10/30/2020 13:58

## 2020-11-16 NOTE — Assessment & Plan Note (Addendum)
-   Treated on admission

## 2020-11-16 NOTE — Progress Notes (Signed)
Chaplain responded to request from patient and family for spiritual support.  Patient's sister and close friend were present.  Patient pastor had just left.  Patient might transfer to Hospice tomorrow.  Family shared about the current situation and medical changes.  Patient was one 10 children just she and her sister are remaining.  Chaplain provided ministry of presence, empathetic/reflective listening and words of comfort and support.  Chaplain prayed with the family.  Chaplain available as needed. [place], Mdiv.    11/16/20 1831  Clinical Encounter Type  Visited With Patient and family together  Visit Type Patient actively dying;Initial;Spiritual support  Referral From Patient;Nurse  Spiritual Encounters  Spiritual Needs Prayer

## 2020-11-16 NOTE — Hospital Course (Signed)
Ms. Wellen is an 80 yo female with PMH stage III colon cancer, recent PE, LLE DVT, seizure disorder, DMII, chronic dCHF, HLD, CAD, HTN who presented to the hospital with weakness and generalized malaise and falling at home. She has recently been discharged as well for an episode of GI bleeding. Her work-up has been significant for pulmonary nodules noted on recent CTA chest.  On admission she underwent CT abdomen/pelvis which showed multiple liver masses. Liver enzymes have also become elevated on lab work recently. Clinically, she appears to have recurrent colon cancer with metastatic lesions and quality of life has significantly declined. She was evaluated by oncology on admission and not considered a treatment candidate.  Given her clinical picture, recommendations were made for pursuing hospice and comfort care given poor prognosis.  A family discussion was held with oncology and the patient with her family and they were in agreement for pursuing residential hospice.

## 2020-11-16 NOTE — TOC Initial Note (Signed)
Transition of Care [facility]) - Initial/Assessment Note    Patient Details  Name: Melissa Reynolds MRN: [id number] Date of Birth: [birth year]  [facility] CM/SW Contact:    Lennart Pall, LCSW Phone Number: 11/16/2020, 1:24 PM  Clinical Narrative:                 Met with pt and family today per MD referral to assist with transition to residential Hospice.  Confirming with all that they are in agreement with this plan and prefer [facility]. Have placed referral with this agency (spoke to [facility]) and was instructed to alert Hospice Home directly as well.  Spoke with Lattie Haw and faxed all requested documentation to [phone].  They will be in touch with pt and family and will alert TOC when residential bed is available.    Expected Discharge Plan: [place] Barriers to Discharge: Continued Medical Work up   Patient Goals and CMS Choice Patient states their goals for this hospitalization and ongoing recovery are:: hospice home      Expected Discharge Plan and Services Expected Discharge Plan: [place] In-house Referral: Clinical Social Work     Living arrangements for the past 2 months: Single Family Home                 DME Arranged: N/A DME Agency: NA                  Prior Living Arrangements/Services Living arrangements for the past 2 months: Single Family Home Lives with:: Siblings Patient language and need for interpreter reviewed:: Yes        Need for Family Participation in Patient Care: No (Comment) Care giver support system in place?: Yes (comment)   Criminal Activity/Legal Involvement Pertinent to Current Situation/Hospitalization: No - Comment as needed  Activities of Daily Living Home Assistive Devices/Equipment: Dentures (specify type) (Upper partials) ADL Screening (condition at time of admission) Patient's cognitive ability adequate to safely complete daily activities?: Yes Is the patient deaf  or have difficulty hearing?: No Does the patient have difficulty seeing, even when wearing glasses/contacts?: No Does the patient have difficulty concentrating, remembering, or making decisions?: No Patient able to express need for assistance with ADLs?: No Does the patient have difficulty dressing or bathing?: No Independently performs ADLs?: Yes (appropriate for developmental age) Does the patient have difficulty walking or climbing stairs?: No Weakness of Legs: None Weakness of Arms/Hands: Both  Permission Sought/Granted Permission sought to share information with : Facility Sport and exercise psychologist                Emotional Assessment Appearance:: Appears stated age Attitude/Demeanor/Rapport: Gracious Affect (typically observed): Accepting Orientation: : Oriented to Self, Oriented to Place, Oriented to  Time, Oriented to Situation Alcohol / Substance Use: Not Applicable Psych Involvement: No (comment)  Admission diagnosis:  Liver masses [R16.0] Patient Active Problem List   Diagnosis Date Noted   Stage III carcinoma of colon ([place]) 11/16/2020   Liver masses 11/23/2020   Hyperkalemia 12/01/2020   AKI (acute kidney injury) ([place]) 11/11/2020   Elevated liver enzymes 11/11/2020   Pulmonary embolus (HCC)    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)    Gastroesophageal reflux disease    Acute blood loss anemia (ABLA) 10/30/2020   Transaminitis 10/29/2020   Early cirrhosis (HCC)    Elevated bilirubin    Anemia    Heme positive stool    DMII (diabetes mellitus, type  2) ([place]) 05/22/2020   Fatigue 03/01/2019   Stable angina ([place]) 10/17/2018   Coronary artery disease of native artery of native heart with stable angina pectoris ([place]) 09/14/2018   Essential hypertension 09/14/2018   Heartburn    RA (rheumatoid arthritis) ([place])    Osteoarthritis    Abnormal stress test 04/06/2017   H/O Colon cancer 08/20/2015   Faintness 04/24/2015   PCP:  [name],  MD Pharmacy:   [place] [place] [facility] [place] [address] [place] [address] Phone: [phone] Fax: [phone]     Social Determinants of Health (SDOH) Interventions    Readmission Risk Interventions No flowsheet data found.

## 2020-11-16 NOTE — Assessment & Plan Note (Deleted)
Continue PPI ?

## 2020-11-16 NOTE — Assessment & Plan Note (Addendum)
-   see colon cancer - differential includes recurrent colon cancer with liver mets vs new malignancy -After review by oncology, this is considered most likely recurrent metastatic colon cancer with poor prognosis and not a treatment candidate -Decision has now been made to pursue residential hospice placement for comfort care after family meeting with oncology

## 2020-11-16 NOTE — Assessment & Plan Note (Addendum)
-   originally diagnosed 10/10/20 and noted propagation of clot on repeat CTA on 10/29/20 -Also has scattered pulmonary nodules measuring up to 7 mm in the right upper lobe.  Again concerning for metastatic disease, see colon cancer and liver masses -Heparin discontinued, see above

## 2020-11-16 NOTE — Progress Notes (Signed)
[place] for heparin Indication: Recent pulmonary embolism/DVT  Allergies  Allergen Reactions   Sulfa Antibiotics Rash    Patient Measurements: Height: 5\' 5"  (165.1 cm) Weight: 89.3 kg (196 lb 13.9 oz) IBW/kg (Calculated) : 57 Heparin Dosing Weight: 76 kg  Vital Signs: Temp: 98.8 F (37.1 C) (07/10 0215) Temp Source: Oral (07/10 0215) BP: 115/57 (07/10 0215) Pulse Rate: 71 (07/10 0215)  Labs: Recent Labs    11/26/2020 0517 12/06/2020 0741 11/28/2020 1223 12/03/2020 2040 11/16/2020 2136 11/16/20 0424  HGB 11.5*  --   --   --   --  10.8*  HCT 32.2*  --   --   --   --  29.9*  PLT 194  --   --   --   --  176  APTT  --   --  48*  --  87* 99*  HEPARINUNFRC  --   --  >1.10* >1.10*  --  >1.10*  CREATININE 1.61*  --   --   --   --   --   TROPONINIHS 67* 68*  --   --   --   --      Estimated Creatinine Clearance: 29.7 mL/min (A) (by C-G formula based on SCr of 1.61 mg/dL (H)).   Medical History: Past Medical History:  Diagnosis Date   Arthritis    rheumatoid   Colon cancer ([place]) 03/2000   stage 3 adenocarcinoma   Colon cancer ([place]) 08/20/2015   Coronary artery disease    Diabetes mellitus    DVT (deep venous thrombosis) (HCC)    Heartburn    Hypertension    Obesity    Osteoarthritis    RA (rheumatoid arthritis) (HCC)    Syncope and collapse     Medications:  Medications Prior to Admission  Medication Sig Dispense Refill Last Dose   acetaminophen (TYLENOL) 500 MG tablet Take 1,000 mg by mouth every 6 (six) hours as needed for moderate pain or headache.      amLODipine (NORVASC) 5 MG tablet Take 5 mg by mouth 2 (two) times daily.    11/14/2020   apixaban (ELIQUIS) 5 MG TABS tablet Take 1 tablet (5 mg total) by mouth 2 (two) times daily. 60 tablet 3 11/14/2020 at 2000   cholecalciferol (VITAMIN D) 25 MCG (1000 UT) tablet Take 2,000 Units by mouth daily.    11/14/2020   empagliflozin (JARDIANCE) 10 MG TABS tablet Take 1 tablet (10 mg  total) by mouth daily before breakfast. 90 tablet 2 11/07/2456   folic acid (FOLVITE) 1 MG tablet Take 1 mg by mouth daily.   11/14/2020   gabapentin (NEURONTIN) 100 MG capsule Take 100 mg by mouth at bedtime.   11/14/2020   hydrochlorothiazide (HYDRODIURIL) 25 MG tablet Take 25 mg by mouth daily.    11/14/2020   HYDROcodone-acetaminophen (NORCO) 10-325 MG tablet Take 1 tablet by mouth every 6 (six) hours as needed.      Insulin Glargine (BASAGLAR KWIKPEN) 100 UNIT/ML Inject 5 Units into the skin at bedtime.   11/14/2020   isosorbide mononitrate (IMDUR) 60 MG 24 hr tablet Take 1 tablet by mouth once daily 90 tablet 0 11/14/2020   levETIRAcetam (KEPPRA) 750 MG tablet Take 750 mg by mouth 2 (two) times daily.   11/14/2020   megestrol (MEGACE) 40 MG/ML suspension Take 10 mLs by mouth in the morning and at bedtime.   11/14/2020   metFORMIN (GLUCOPHAGE) 500 MG tablet Take 500 mg by mouth 2 (  two) times daily with a meal.   11/14/2020   metoprolol tartrate (LOPRESSOR) 50 MG tablet Take 1 tablet by mouth twice daily 180 tablet 0 11/14/2020 at 2000   nitroGLYCERIN (NITROSTAT) 0.4 MG SL tablet Place 0.4 mg under the tongue every 5 (five) minutes x 3 doses as needed.   unk at unk   ondansetron (ZOFRAN) 4 MG tablet Take 1 tablet (4 mg total) by mouth every 6 (six) hours as needed for nausea or vomiting. 12 tablet 0    pantoprazole (PROTONIX) 40 MG tablet Take 1 tablet by mouth once daily 90 tablet 0 11/14/2020   polyethylene glycol (MIRALAX / GLYCOLAX) 17 g packet Take 17 g by mouth daily. 28 each 1 11/14/2020   potassium chloride SA (KLOR-CON) 20 MEQ tablet Take 1 tablet (20 mEq total) by mouth 2 (two) times daily. 10 tablet 0 11/14/2020   rosuvastatin (CRESTOR) 5 MG tablet Take 1 tablet (5 mg total) by mouth daily. Hold until follow up with PCP   11/14/2020   spironolactone (ALDACTONE) 25 MG tablet Take 0.5 tablets (12.5 mg total) by mouth daily. 60 tablet 0 11/14/2020    Assessment: Pharmacy consulted to dose heparin in patient with  recent pulmonary embolism/DVT on 6/3 (discharged 6/25). On apixaban prior to admission with last dose 7/8 2000.  Will need to monitor based on aPTT until correlation with heparin levels.  11/16/2020: aPTT 99 sec- therapeutic on heparin infusion at 800 units/hr Heparin level >1.1- elevated as anticipated due to interaction with Eliquis.   CBC- Hg slightly low at 10.8, Pltc WNL No bleeding or infusion related concerns per nursing.   Goal of Therapy:  Heparin level 0.3-0.7 units/ml aPTT 66-102 seconds Monitor platelets by anticoagulation protocol: Yes   Plan:  Continue heparin infusion at 800 units/hr Daily aPTT/heparin level & CBC while on heparin  Netta Cedars, PharmD Clinical Pharmacist 11/16/2020 5:41 AM

## 2020-11-16 NOTE — Assessment & Plan Note (Addendum)
-   baseline creatinine ~ 0.7 - patient presents with increase in creat >0.3 mg/dL above baseline, creat increase >1.5x baseline presumed to have occurred within past 7 days PTA -Initially started on fluids on admission.  Discontinued now in setting of pursuing hospice

## 2020-11-16 NOTE — Assessment & Plan Note (Addendum)
-   Likely due to liver masses and consistent with synthetic dysfunction - Speaks to poor prognosis

## 2020-11-16 NOTE — Progress Notes (Signed)
Progress Note    Melissa Reynolds   QMG:[id number]  DOB: Jun 20, 1940  DOA: 11/10/2020     1  PCP: Melissa Beard, MD  CC: weakness, falling   Hospital Course: Melissa Reynolds is an 80 yo female with PMH stage III colon cancer, recent PE, LLE DVT, seizure disorder, DMII, chronic dCHF, HLD, CAD, HTN who presented to the hospital with weakness and generalized malaise and falling at home. She has recently been discharged as well for an episode of GI bleeding. Her work-up has been significant for pulmonary nodules noted on recent CTA chest.  On admission she underwent CT abdomen/pelvis which showed multiple liver masses. Liver enzymes have also become elevated on lab work recently. Clinically, she appears to have recurrent colon cancer with metastatic lesions and quality of life has significantly declined. She was evaluated by oncology on admission and not considered a treatment candidate.  Given her clinical picture, recommendations were made for pursuing hospice and comfort care given poor prognosis.  A family discussion was held with oncology and the patient with her family and they were in agreement for pursuing residential hospice.  Interval History:  Transferred from [facility] for further work-up and oncology evaluation. Decision made to pursue residential hospice placement after family meeting with oncology today. When seen, she was uncomfortable and weak appearing.  ROS: Constitutional: positive for fatigue and malaise, negative for chills and fevers, Respiratory: positive for dyspnea on exertion, Cardiovascular: negative for chest pain and chest pressure/discomfort, and Gastrointestinal: negative for diarrhea  Assessment & Plan: * Liver masses - see colon cancer - differential includes recurrent colon cancer with liver mets vs new malignancy -After review by oncology, this is considered most likely recurrent metastatic colon cancer with poor prognosis and not a treatment  candidate -Decision has now been made to pursue residential hospice placement for comfort care after family meeting with oncology  Stage III carcinoma of colon [facility]) - Last oncology note reviewed from 12/04/2019.  Patient diagnosed November 2001.  2/4 lymph nodes positive with lymphovascular invasion.  Has been treated with 8 cycles of FOLFIRI.  Last colonoscopy 2015.  Last CTAP 03/17/2018 neg for metastatic disease - now with new liver lesions, concern would be recurrent cancer with liver mets vs new hepatic malignancy - follow up oncology consult  -Prognosis already appears poor given liver masses, pulmonary nodules, and history of colon cancer; now with PE's too; patient may best be served with discussions centered around hospice and comfort at this point (see liver mass discussion)  Elevated liver enzymes - Likely due to liver masses and consistent with synthetic dysfunction - Speaks to poor prognosis  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) - DC heparin drip after discussion above  Pulmonary embolus ([place]) - originally diagnosed 10/10/20 and noted propagation of clot on repeat CTA on 10/29/20 -Also has scattered pulmonary nodules measuring up to 7 mm in the right upper lobe.  Again concerning for metastatic disease, see colon cancer and liver masses -Heparin discontinued, see above  AKI (acute kidney injury) ([place]) - baseline creatinine ~ 0.7 - patient presents with increase in creat >0.3 mg/dL above baseline, creat increase >1.5x baseline presumed to have occurred within past 7 days PTA -Initially started on fluids on admission.  Discontinued now in setting of pursuing hospice   Hyperkalemia - Treated on admission  DMII (diabetes mellitus, type 2) (HCC) - A1c 6.6% on 10/29/2020 - Continue diet control  Essential hypertension - Continue comfort care  Coronary  artery disease of native artery of native heart with stable angina pectoris ([place]) - Meds being  de-escalated in setting of pursuing hospice/comfort care    Old records reviewed in assessment of this patient  Antimicrobials:   DVT prophylaxis:    Code Status:   Code Status: DNR Family Communication: sister  Disposition Plan: Status is: Inpatient  Remains inpatient appropriate because: pursuing residential hospice  Dispo: The patient is from: Home              Anticipated d/c is to:  residential hospice              Patient currently is not medically stable to d/c.   Difficult to place patient No      Risk of unplanned readmission score: Unplanned Admission- Pilot do not use: 32.94   Objective: Blood pressure (!) 120/43, pulse (!) 41, temperature 97.6 F (36.4 C), temperature source Oral, resp. rate 18, height 5\' 5"  (1.651 m), weight 89.3 kg, SpO2 97 %.  Examination: General appearance:  Ill-appearing elderly woman sitting on side of bed uncomfortable and short of breath Head: Normocephalic, without obvious abnormality, atraumatic Eyes:  EOMI Lungs:  Mostly clear throughout, some scattered crackles Heart: regular rate and rhythm and S1, S2 normal Abdomen:  Nonspecific tenderness throughout, distant bowel sounds Extremities:  Trace lower extremity edema Skin: mobility and turgor normal Neurologic: No focal deficits  Consultants:  Oncology  Procedures:    Data Reviewed: I have personally reviewed following labs and imaging studies Results for orders placed or performed during the hospital encounter of 11/29/2020 (from the past 24 hour(s))  APTT     Status: Abnormal   Collection Time: 11/09/2020 12:23 PM  Result Value Ref Range   aPTT 48 (H) 24 - 36 seconds  Heparin level (unfractionated)     Status: Abnormal   Collection Time: 11/07/2020 12:23 PM  Result Value Ref Range   Heparin Unfractionated >1.10 (H) 0.30 - 0.70 IU/mL  CBG monitoring, ED     Status: Abnormal   Collection Time: 12/03/2020  1:15 PM  Result Value Ref Range   Glucose-Capillary 140 (H) 70 - 99  mg/dL  Heparin level (unfractionated)     Status: Abnormal   Collection Time: 11/08/2020  8:40 PM  Result Value Ref Range   Heparin Unfractionated >1.10 (H) 0.30 - 0.70 IU/mL  APTT     Status: Abnormal   Collection Time: 12/02/2020  9:36 PM  Result Value Ref Range   aPTT 87 (H) 24 - 36 seconds  Comprehensive metabolic panel     Status: Abnormal   Collection Time: 11/16/20  4:24 AM  Result Value Ref Range   Sodium 138 135 - 145 mmol/L   Potassium 5.5 (H) 3.5 - 5.1 mmol/L   Chloride 108 98 - 111 mmol/L   CO2 18 (L) 22 - 32 mmol/L   Glucose, Bld 112 (H) 70 - 99 mg/dL   BUN 48 (H) 8 - 23 mg/dL   Creatinine, Ser 1.55 (H) 0.44 - 1.00 mg/dL   Calcium 7.4 (L) 8.9 - 10.3 mg/dL   Total Protein 5.6 (L) 6.5 - 8.1 g/dL   Albumin 1.9 (L) 3.5 - 5.0 g/dL   AST 474 (H) 15 - 41 U/L   ALT 128 (H) 0 - 44 U/L   Alkaline Phosphatase 443 (H) 38 - 126 U/L   Total Bilirubin 3.6 (H) 0.3 - 1.2 mg/dL   GFR, Estimated 33 (L) >60 mL/min   Anion gap 12 5 -  15  Magnesium     Status: None   Collection Time: 11/16/20  4:24 AM  Result Value Ref Range   Magnesium 1.8 1.7 - 2.4 mg/dL  Heparin level (unfractionated)     Status: Abnormal   Collection Time: 11/16/20  4:24 AM  Result Value Ref Range   Heparin Unfractionated >1.10 (H) 0.30 - 0.70 IU/mL  CBC     Status: Abnormal   Collection Time: 11/16/20  4:24 AM  Result Value Ref Range   WBC 15.1 (H) 4.0 - 10.5 K/uL   RBC 4.05 3.87 - 5.11 MIL/uL   Hemoglobin 10.8 (L) 12.0 - 15.0 g/dL   HCT 29.9 (L) 36.0 - 46.0 %   MCV 73.8 (L) 80.0 - 100.0 fL   MCH 26.7 26.0 - 34.0 pg   MCHC 36.1 (H) 30.0 - 36.0 g/dL   RDW 18.6 (H) 11.5 - 15.5 %   Platelets 176 150 - 400 K/uL   nRBC 0.7 (H) 0.0 - 0.2 %  APTT     Status: Abnormal   Collection Time: 11/16/20  4:24 AM  Result Value Ref Range   aPTT 99 (H) 24 - 36 seconds  Protime-INR     Status: Abnormal   Collection Time: 11/16/20  8:53 AM  Result Value Ref Range   Prothrombin Time 35.9 (H) 11.4 - 15.2 seconds   INR 3.6  (H) 0.8 - 1.2    No results found for this or any previous visit (from the past 240 hour(s)).   Radiology Studies: CT Head Wo Contrast  Result Date: 11/24/2020 CLINICAL DATA:  Weakness, found down. EXAM: CT HEAD WITHOUT CONTRAST TECHNIQUE: Contiguous axial images were obtained from the base of the skull through the vertex without intravenous contrast. COMPARISON:  Head CT dated 12/18/2015. FINDINGS: Brain: Ventricles are stable in size and configuration. Mild chronic small vessel ischemic changes within the deep periventricular white matter regions bilaterally. No mass, hemorrhage, edema or other evidence of acute parenchymal abnormality. No extra-axial hemorrhage. Vascular: Chronic calcified atherosclerotic changes of the large vessels at the skull base. No unexpected hyperdense vessel. Skull: Normal. Negative for fracture or focal lesion. Sinuses/Orbits: Sinuses are clear. Surgical changes of RIGHT mastoidectomy. Periorbital and retro-orbital soft tissues are unremarkable. Other: None. IMPRESSION: 1. No acute findings. No intracranial mass, hemorrhage or edema. 2. Mild chronic small vessel ischemic changes in the white matter. Electronically Signed   By: Franki Cabot M.D.   On: 12/01/2020 07:56   CT ABDOMEN PELVIS W CONTRAST  Result Date: 12/06/2020 CLINICAL DATA:  Found down, weakness. EXAM: CT ABDOMEN AND PELVIS WITH CONTRAST TECHNIQUE: Multidetector CT imaging of the abdomen and pelvis was performed using the standard protocol following bolus administration of intravenous contrast. CONTRAST:  52mL OMNIPAQUE IOHEXOL 300 MG/ML  SOLN COMPARISON:  CT abdomen dated 03/17/2018. FINDINGS: Lower chest: No acute abnormality. Hepatobiliary: Liver is diffusely infiltrated with masses, largest/most confluent mass within the RIGHT liver lobe measuring approximately 14 x 13 cm. Status post cholecystectomy. No bile duct dilatation seen. Pancreas: Unremarkable. No pancreatic ductal dilatation or surrounding  inflammatory changes. Spleen: Normal in size without focal abnormality. Adrenals/Urinary Tract: Adrenal glands are unremarkable. Wedge-shaped hypodensity within the LEFT renal cortex, suggesting edema, infarct versus pyelonephritis. Small hypodense lesion within the lower pole of the RIGHT kidney is too small to characterize but most likely a small cyst. No hydronephrosis bilaterally. No ureteral or bladder calculi identified. Bladder is unremarkable. Stomach/Bowel: No dilated large or small bowel loops. Surgical anastomosis at the level of  the LEFT splenic flexure, presumably related to a previous partial colon resection. No obstruction or other complicating feature in this area. Stomach is unremarkable, partially decompressed.w Vascular/Lymphatic: Aortic atherosclerosis. No acute-appearing vascular abnormality. No enlarged lymph nodes are seen within the abdomen or pelvis. Reproductive: Presumed hysterectomy.  No adnexal mass or free fluid. Other: Small amount of free fluid underlying the RIGHT liver lobe, likely related to the overlying hepatic mass. Musculoskeletal: No acute or suspicious osseous abnormality. IMPRESSION: 1. Liver is diffusely infiltrated with masses, largest/most confluent mass within the RIGHT liver lobe measuring approximately 14 x 13 cm. Findings are highly suspicious for metastatic disease. 2. Wedge-shaped hypodensity within the LEFT renal cortex, suggesting edema, most likely focal infarct or pyelonephritis. Recommend correlation with urinalysis. 3. Surgical anastomosis at the level of the LEFT splenic flexure, presumably related to a previous partial colon resection. No obstruction or other complicating feature in this area. 4. No bowel obstruction.  No free intraperitoneal air. Aortic Atherosclerosis (ICD10-I70.0). Electronically Signed   By: Franki Cabot M.D.   On: 11/29/2020 08:08   DG Chest Portable 1 View  Result Date: 12/01/2020 CLINICAL DATA:  Weakness EXAM: PORTABLE CHEST 1  VIEW COMPARISON:  09/25/2019 FINDINGS: Postoperative thoracic inlet. Low volume chest with band of density at the right base favoring atelectasis. There is no edema, air bronchogram, effusion, or pneumothorax. Normal heart size and mediastinal contours IMPRESSION: Low volume chest with right base opacity favoring atelectasis. Electronically Signed   By: Monte Fantasia M.D.   On: 12/02/2020 05:53   CT Head Wo Contrast  Final Result    CT ABDOMEN PELVIS W CONTRAST  Final Result    DG Chest Portable 1 View  Final Result      Scheduled Meds:  feeding supplement  237 mL Oral BID BM   isosorbide mononitrate  60 mg Oral Daily   PRN Meds: acetaminophen **OR** acetaminophen, antiseptic oral rinse, bisacodyl, fentaNYL (SUBLIMAZE) injection, glycopyrrolate **OR** glycopyrrolate **OR** glycopyrrolate, haloperidol **OR** haloperidol **OR** haloperidol lactate, morphine, nitroGLYCERIN, ondansetron **OR** ondansetron (ZOFRAN) IV, oxyCODONE, polyvinyl alcohol Continuous Infusions:   LOS: 1 day  Time spent: Greater than 50% of the 35 minute visit was spent in counseling/coordination of care for the patient as laid out in the A&P.   Dwyane Dee, MD [facility] 11/16/2020, 11:49 AM

## 2020-11-16 NOTE — Assessment & Plan Note (Addendum)
-   DC heparin drip after discussion above

## 2020-11-16 NOTE — Assessment & Plan Note (Addendum)
-   Meds being de-escalated in setting of pursuing hospice/comfort care

## 2020-11-16 NOTE — Assessment & Plan Note (Addendum)
-   Continue comfort care 

## 2020-11-16 NOTE — Assessment & Plan Note (Addendum)
-   Last oncology note reviewed from 12/04/2019.  Patient diagnosed November 2001.  2/4 lymph nodes positive with lymphovascular invasion.  Has been treated with 8 cycles of FOLFIRI.  Last colonoscopy 2015.  Last CTAP 03/17/2018 neg for metastatic disease - now with new liver lesions, concern would be recurrent cancer with liver mets vs new hepatic malignancy - follow up oncology consult  -Prognosis already appears poor given liver masses, pulmonary nodules, and history of colon cancer; now with PE's too; patient may best be served with discussions centered around hospice and comfort at this point (see liver mass discussion)

## 2020-11-17 DIAGNOSIS — C189 Malignant neoplasm of colon, unspecified: Secondary | ICD-10-CM | POA: Diagnosis not present

## 2020-11-17 DIAGNOSIS — R160 Hepatomegaly, not elsewhere classified: Secondary | ICD-10-CM | POA: Diagnosis not present

## 2020-11-17 LAB — URINE CULTURE: Culture: NO GROWTH

## 2020-11-17 NOTE — Progress Notes (Signed)
I provided support to Evan's family and also assisted them with getting a notary for some documents they needed to have her sign.    [place], Bcc Pager, [phone] 4:44 PM

## 2020-11-17 NOTE — Progress Notes (Signed)
Progress Note    Melissa Reynolds   ZOX:[id number]  DOB: 04-25-1941  DOA: 11/27/2020     2  PCP: Melissa Beard, MD  CC: weakness, falling   Hospital Course: Melissa Reynolds is an 80 yo female with PMH stage III colon cancer, recent PE, LLE DVT, seizure disorder, DMII, chronic dCHF, HLD, CAD, HTN who presented to the hospital with weakness and generalized malaise and falling at home. She has recently been discharged as well for an episode of GI bleeding. Her work-up has been significant for pulmonary nodules noted on recent CTA chest.  On admission she underwent CT abdomen/pelvis which showed multiple liver masses. Liver enzymes have also become elevated on lab work recently. Clinically, she appears to have recurrent colon cancer with metastatic lesions and quality of life has significantly declined. She was evaluated by oncology on admission and not considered a treatment candidate.  Given her clinical picture, recommendations were made for pursuing hospice and comfort care given poor prognosis.  A family discussion was held with oncology and the patient with her family and they were in agreement for pursuing residential hospice.  Interval History:  No events overnight.  She appears slightly uncomfortable resting in bed this morning.  Complaining of left back pain and right-sided abdominal pain still.  ROS: Constitutional: positive for fatigue and malaise, negative for chills and fevers, Respiratory: positive for dyspnea on exertion, Cardiovascular: negative for chest pain and chest pressure/discomfort, and Gastrointestinal: negative for diarrhea  Assessment & Plan: * Liver masses - see colon cancer - differential includes recurrent colon cancer with liver mets vs new malignancy -After review by oncology, this is considered most likely recurrent metastatic colon cancer with poor prognosis and not a treatment candidate -Decision has now been made to pursue residential hospice placement  for comfort care after family meeting with oncology  Stage III carcinoma of colon [facility]) - Last oncology note reviewed from 12/04/2019.  Patient diagnosed November 2001.  2/4 lymph nodes positive with lymphovascular invasion.  Has been treated with 8 cycles of FOLFIRI.  Last colonoscopy 2015.  Last CTAP 03/17/2018 neg for metastatic disease - now with new liver lesions, concern would be recurrent cancer with liver mets vs new hepatic malignancy - follow up oncology consult  -Prognosis already appears poor given liver masses, pulmonary nodules, and history of colon cancer; now with PE's too; patient may best be served with discussions centered around hospice and comfort at this point (see liver mass discussion)  Elevated liver enzymes - Likely due to liver masses and consistent with synthetic dysfunction - Speaks to poor prognosis  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) - DC heparin drip after discussion above  Pulmonary embolus ([place]) - originally diagnosed 10/10/20 and noted propagation of clot on repeat CTA on 10/29/20 -Also has scattered pulmonary nodules measuring up to 7 mm in the right upper lobe.  Again concerning for metastatic disease, see colon cancer and liver masses -Heparin discontinued, see above  AKI (acute kidney injury) ([place]) - baseline creatinine ~ 0.7 - patient presents with increase in creat >0.3 mg/dL above baseline, creat increase >1.5x baseline presumed to have occurred within past 7 days PTA -Initially started on fluids on admission.  Discontinued now in setting of pursuing hospice   Hyperkalemia - Treated on admission  DMII (diabetes mellitus, type 2) (HCC) - A1c 6.6% on 10/29/2020 - Continue diet control  Essential hypertension - Continue comfort care  Coronary artery disease of native artery of native  heart with stable angina pectoris ([place]) - Meds being de-escalated in setting of pursuing hospice/comfort care   Old records reviewed  in assessment of this patient  Antimicrobials:   DVT prophylaxis:    Code Status:   Code Status: DNR Family Communication: sister  Disposition Plan: Status is: Inpatient  Remains inpatient appropriate because: pursuing residential hospice  Dispo: The patient is from: Home              Anticipated d/c is to:  residential hospice              Patient currently stable for discharging to residential hospice   Difficult to place patient No  Risk of unplanned readmission score: Unplanned Admission- Pilot do not use: 45.03   Objective: Blood pressure (!) 121/53, pulse 84, temperature 98.7 F (37.1 C), temperature source Oral, resp. rate 15, height 5\' 5"  (1.651 m), weight 89.3 kg, SpO2 95 %.  Examination: General appearance:  Ill-appearing elderly woman sitting on side of bed uncomfortable and short of breath Head: Normocephalic, without obvious abnormality, atraumatic Eyes:  EOMI Lungs:  Mostly clear throughout, some scattered crackles Heart: regular rate and rhythm and S1, S2 normal Abdomen:  Nonspecific tenderness throughout, distant bowel sounds Extremities:  Trace lower extremity edema Skin: mobility and turgor normal Neurologic: No focal deficits  Consultants:  Oncology  Procedures:    Data Reviewed: I have personally reviewed following labs and imaging studies No results found for this or any previous visit (from the past 24 hour(s)).   Recent Results (from the past 240 hour(s))  Culture, Urine     Status: None   Collection Time: 11/24/2020 11:31 AM   Specimen: Urine, Clean Catch  Result Value Ref Range Status   Specimen Description   Final    URINE, CLEAN CATCH Performed at [facility], [address], [place], [place] [address]    Special Requests   Final    NONE Performed at [facility], [address], [place], [place] [address]    Culture   Final    NO GROWTH Performed at [facility], [place] [address] [place], [place] [address]    Report Status  11/17/2020 FINAL  Final     Radiology Studies: No results found. CT Head Wo Contrast  Final Result    CT ABDOMEN PELVIS W CONTRAST  Final Result    DG Chest Portable 1 View  Final Result      Scheduled Meds:  feeding supplement  237 mL Oral BID BM   isosorbide mononitrate  60 mg Oral Daily   PRN Meds: acetaminophen **OR** acetaminophen, antiseptic oral rinse, bisacodyl, fentaNYL (SUBLIMAZE) injection, glycopyrrolate **OR** glycopyrrolate **OR** glycopyrrolate, haloperidol **OR** haloperidol **OR** haloperidol lactate, morphine, nitroGLYCERIN, ondansetron **OR** ondansetron (ZOFRAN) IV, oxyCODONE, polyvinyl alcohol Continuous Infusions:   LOS: 2 days  Time spent: Greater than 50% of the 35 minute visit was spent in counseling/coordination of care for the patient as laid out in the A&P.   Dwyane Dee, MD [facility] 11/17/2020, 2:19 PM

## 2020-11-17 NOTE — TOC Progression Note (Addendum)
Transition of Care [facility]) - Progression Note    Patient Details  Name: JAMERA VANLOAN MRN: [id number] Date of Birth: 1941-01-26  Transition of Care Nasia Cannan [facility]) CM/SW [place],  Phone Number: 11/17/2020, 12:24 PM  Clinical Narrative:   Spoke with Cassandra at [facility]  Patient has been approved for admission-no beds today.  She will reach out to family to answer their questions and get paperwork taken care of.  She will alert me when they have a bed. TOC will continue to follow during the course of hospitalization.  Addendum: So far [facility] has been unable to contact sister.  Addendum II: Found sister in patient's room.  Her cell phone number is [phone].  I gave her number to call, ask for [facility].     Expected Discharge Plan: [place] Barriers to Discharge: Continued Medical Work up  Expected Discharge Plan and Services Expected Discharge Plan: [place] In-house Referral: Clinical Social Work     Living arrangements for the past 2 months: Single Family Home                 DME Arranged: N/A DME Agency: NA                   Social Determinants of Health (SDOH) Interventions    Readmission Risk Interventions No flowsheet data found.

## 2020-11-18 DIAGNOSIS — R160 Hepatomegaly, not elsewhere classified: Secondary | ICD-10-CM | POA: Diagnosis not present

## 2020-11-18 LAB — CEA: CEA: 6597 ng/mL — ABNORMAL HIGH (ref 0.0–4.7)

## 2020-11-18 NOTE — Care Management Important Message (Signed)
Important Message  Patient Details IM Letter given to the Patient. Name: Melissa Reynolds MRN: [id number] Date of Birth: 1940/09/01   Medicare Important Message Given:  Yes     Kerin Salen 11/18/2020, 11:42 AM

## 2020-11-18 NOTE — TOC Progression Note (Addendum)
Transition of Care [facility]) - Progression Note    Patient Details  Name: ILMA ACHEE MRN: [id number] Date of Birth: 1940-07-11  Transition of Care [facility]) CM/SW Jaclene Bartelt [place], [place] Phone Number: 11/18/2020, 1:49 PM  Clinical Narrative:   Left message for Cassandra at Hospice. TOC will continue to follow during the course of hospitalization.  Addendum:  Call back from [facility] to let me know that family has been successfully reached.  Waiting for bed opening.  They will call when they have an opening. TOC will continue to follow during the course of hospitalization.      Expected Discharge Plan: [place] Barriers to Discharge: Continued Medical Work up  Expected Discharge Plan and Services Expected Discharge Plan: [place] In-house Referral: Clinical Social Work     Living arrangements for the past 2 months: Single Family Home                 DME Arranged: N/A DME Agency: NA                   Social Determinants of Health (SDOH) Interventions    Readmission Risk Interventions No flowsheet data found.

## 2020-11-18 NOTE — Progress Notes (Signed)
Progress Note    Melissa Reynolds   ZOX:[id number]  DOB: Apr 29, 1941  DOA: 12/02/2020     3  PCP: [name], MD  CC: weakness, falling   Hospital Course: Melissa Reynolds is an 80 yo female with PMH stage III colon cancer, recent PE, LLE DVT, seizure disorder, DMII, chronic dCHF, HLD, CAD, HTN who presented to the hospital with weakness and generalized malaise and falling at home. She has recently been discharged as well for an episode of GI bleeding. Her work-up has been significant for pulmonary nodules noted on recent CTA chest.  On admission she underwent CT abdomen/pelvis which showed multiple liver masses. Liver enzymes have also become elevated on lab work recently. Clinically, she appears to have recurrent colon cancer with metastatic lesions and quality of life has significantly declined. She was evaluated by oncology on admission and not considered a treatment candidate.  Given her clinical picture, recommendations were made for pursuing hospice and comfort care given poor prognosis.  A family discussion was held with oncology and the patient with her family and they were in agreement for pursuing residential hospice.  Interval History:  She was much more lethargic this morning and barely able to keep her eyes open much.  Did appear to be uncomfortable some during abdominal exam and further pain medication will be given. Still awaiting residential hospice placement.  ROS: Constitutional: positive for fatigue and malaise, negative for chills and fevers, Respiratory: positive for dyspnea on exertion, Cardiovascular: negative for chest pain and chest pressure/discomfort, and Gastrointestinal: negative for diarrhea  Assessment & Plan: * Liver masses - see colon cancer - differential includes recurrent colon cancer with liver mets vs new malignancy -After review by oncology, this is considered most likely recurrent metastatic colon cancer with poor prognosis and not a treatment  candidate -Decision has now been made to pursue residential hospice placement for comfort care after family meeting with oncology  Stage III carcinoma of colon [facility]) - Last oncology note reviewed from 12/04/2019.  Patient diagnosed November 2001.  2/4 lymph nodes positive with lymphovascular invasion.  Has been treated with 8 cycles of FOLFIRI.  Last colonoscopy 2015.  Last CTAP 03/17/2018 neg for metastatic disease - now with new liver lesions, concern would be recurrent cancer with liver mets vs new hepatic malignancy - follow up oncology consult  -Prognosis already appears poor given liver masses, pulmonary nodules, and history of colon cancer; now with PE's too; patient may best be served with discussions centered around hospice and comfort at this point (see liver mass discussion)  Elevated liver enzymes - Likely due to liver masses and consistent with synthetic dysfunction - Speaks to poor prognosis  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) - DC heparin drip after discussion above  Pulmonary embolus ([place]) - originally diagnosed 10/10/20 and noted propagation of clot on repeat CTA on 10/29/20 -Also has scattered pulmonary nodules measuring up to 7 mm in the right upper lobe.  Again concerning for metastatic disease, see colon cancer and liver masses -Heparin discontinued, see above  AKI (acute kidney injury) ([place]) - baseline creatinine ~ 0.7 - patient presents with increase in creat >0.3 mg/dL above baseline, creat increase >1.5x baseline presumed to have occurred within past 7 days PTA -Initially started on fluids on admission.  Discontinued now in setting of pursuing hospice   Hyperkalemia - Treated on admission  DMII (diabetes mellitus, type 2) (HCC) - A1c 6.6% on 10/29/2020 - Continue diet control  Essential  hypertension - Continue comfort care  Coronary artery disease of native artery of native heart with stable angina pectoris ([place]) - Meds being  de-escalated in setting of pursuing hospice/comfort care   Old records reviewed in assessment of this patient  Antimicrobials:   DVT prophylaxis:    Code Status:   Code Status: DNR Family Communication: sister  Disposition Plan: Status is: Inpatient  Remains inpatient appropriate because: pursuing residential hospice  Dispo: The patient is from: Home              Anticipated d/c is to:  residential hospice              Patient currently stable for discharging to residential hospice   Difficult to place patient No  Risk of unplanned readmission score: Unplanned Admission- Pilot do not use: 45.46   Objective: Blood pressure (!) 122/40, pulse 86, temperature 98.7 F (37.1 C), temperature source Oral, resp. rate 12, height 5\' 5"  (1.651 m), weight 89.3 kg, SpO2 93 %.  Examination: General appearance:  Much more lethargic in bed today with eyes closed and briefly opens them to voice Head: Normocephalic, without obvious abnormality, atraumatic Eyes:  EOMI Lungs:  Mostly clear throughout, some scattered crackles Heart: regular rate and rhythm and S1, S2 normal Abdomen:  Worsened tenderness throughout.  Bowel sounds present Extremities:  Trace lower extremity edema Skin: mobility and turgor normal Neurologic: No focal deficits  Consultants:  Oncology  Procedures:    Data Reviewed: I have personally reviewed following labs and imaging studies No results found for this or any previous visit (from the past 24 hour(s)).   Recent Results (from the past 240 hour(s))  Culture, Urine     Status: None   Collection Time: 11/14/2020 11:31 AM   Specimen: Urine, Clean Catch  Result Value Ref Range Status   Specimen Description   Final    URINE, CLEAN CATCH Performed at [facility], [address], [place], [place] [address]    Special Requests   Final    NONE Performed at [facility], [address] [place], [place] [address]    Culture   Final    NO GROWTH Performed at [facility], [place] [address], [place], [place] [address]    Report Status 11/17/2020 FINAL  Final     Radiology Studies: No results found. CT Head Wo Contrast  Final Result    CT ABDOMEN PELVIS W CONTRAST  Final Result    DG Chest Portable 1 View  Final Result      Scheduled Meds:  feeding supplement  237 mL Oral BID BM   isosorbide mononitrate  60 mg Oral Daily   PRN Meds: acetaminophen **OR** acetaminophen, antiseptic oral rinse, bisacodyl, fentaNYL (SUBLIMAZE) injection, glycopyrrolate **OR** glycopyrrolate **OR** glycopyrrolate, haloperidol **OR** haloperidol **OR** haloperidol lactate, morphine, nitroGLYCERIN, ondansetron **OR** ondansetron (ZOFRAN) IV, oxyCODONE, polyvinyl alcohol Continuous Infusions:   LOS: 3 days  Time spent: Greater than 50% of the 35 minute visit was spent in counseling/coordination of care for the patient as laid out in the A&P.   Dwyane Dee, MD [facility] 11/18/2020, 1:06 PM

## 2020-11-18 NOTE — Progress Notes (Signed)
Provided support to family.  There were several at the bedside, but all were doing okay for the moment.  Chaplain Janne Napoleon, Bcc PAger, [phone] 12:57 PM

## 2020-11-19 DIAGNOSIS — Z515 Encounter for palliative care: Secondary | ICD-10-CM

## 2020-11-20 ENCOUNTER — Ambulatory Visit: Payer: Medicare HMO | Admitting: Cardiology

## 2020-11-26 ENCOUNTER — Other Ambulatory Visit (HOSPITAL_COMMUNITY): Payer: Medicare Other

## 2020-12-03 ENCOUNTER — Ambulatory Visit (HOSPITAL_COMMUNITY): Payer: Medicare Other | Admitting: Nurse Practitioner

## 2020-12-03 ENCOUNTER — Ambulatory Visit (HOSPITAL_COMMUNITY): Payer: Medicare Other | Admitting: Hematology and Oncology

## 2020-12-08 NOTE — Progress Notes (Signed)
PROGRESS NOTE    KASARAH SITTS  POE:[id number] DOB: April 18, 1941 DOA: 11/08/2020 PCP: [name], MD    Brief Narrative: 80 year old female with history of stage III colon cancer, recent pulmonary embolism, DVT, seizure disorder, type 2 diabetes and hypertension presented to the hospital with weakness and generalized malaise and fall at home.  Patient was recently diagnosed with metastatic colon cancer with mets to lungs.  On admission, CT scan of the abdomen pelvis was done that showed multiple liver masses.  Patient was evaluated by oncology on admission and considered to be hospice candidate.  With her family decided to pursue hospice level of care.  Currently waiting for inpatient hospice bed availability.   Assessment & Plan:   Principal Problem:   End of life care Active Problems:   RA (rheumatoid arthritis) (HCC)   Osteoarthritis   Coronary artery disease of native artery of native heart with stable angina pectoris (HCC)   Essential hypertension   DMII (diabetes mellitus, type 2) (HCC)   Pulmonary embolus (HCC)   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)   Gastroesophageal reflux disease   Liver masses   Hyperkalemia   AKI (acute kidney injury) ([place])   Elevated liver enzymes   Stage III carcinoma of colon ([place])  End-of-life care Metastatic colon cancer with metastasis to lungs and liver. PE and DVT Adult failure to thrive  Plan: Continue to provide symptomatic treatment, end-of-life care in the hospital while waiting for inpatient bed. All symptom control medications including nausea medications, anxiety and pain relief available. RN to pronounce death if happens in the hospital.   DVT prophylaxis:   Comfort care   Code Status: Comfort care Family Communication: None at the bedside Disposition Plan: Status is: Inpatient  Remains inpatient appropriate because:Unsafe d/c plan  Dispo: The patient is from: Home              Anticipated  d/c is to:  Inpatient hospice              Patient currently is medically stable to d/c.  Patient is stable only to transfer to hospice level of care.   Difficult to place patient No         Consultants:  Oncology  Procedures:  None  Antimicrobials:  None   Subjective: Patient seen and examined.  Unresponsive.  Looks comfortable.  Objective: Vitals:   11/16/20 0604 11/16/20 0944 11/17/20 0629 11/18/20 0441  BP: 125/61 (!) 120/43 (!) 121/53 (!) 122/40  Pulse: 71 (!) 41 84 86  Resp: 18 18 15 12   Temp: 97.6 F (36.4 C)  98.7 F (37.1 C)   TempSrc: Oral  Oral   SpO2: 98% 97% 95% 93%  Weight:      Height:        Intake/Output Summary (Last 24 hours) at 12-16-2020 1256 Last data filed at 12/16/2020 1101 Gross per 24 hour  Intake 80 ml  Output 0 ml  Net 80 ml   Filed Weights   11/10/2020 0502  Weight: 89.3 kg    Examination:  Patient is chronically sick looking, debilitated and frail, sleeping with eyes closed.  Does not open eyes to voice.  She looks comfortable after medications.    Data Reviewed: I have personally reviewed following labs and imaging studies  CBC: Recent Labs  Lab 12/02/2020 0517 11/16/20 0424  WBC 14.6* 15.1*  NEUTROABS 10.6*  --   HGB 11.5* 10.8*  HCT 32.2* 29.9*  MCV 76.3* 73.8*  PLT 194 938   Basic Metabolic Panel: Recent Labs  Lab 11/16/2020 0517 11/16/20 0424  NA 132* 138  K 5.9* 5.5*  CL 101 108  CO2 16* 18*  GLUCOSE 197* 112*  BUN 45* 48*  CREATININE 1.61* 1.55*  CALCIUM 7.9* 7.4*  MG 1.9 1.8   GFR: Estimated Creatinine Clearance: 30.9 mL/min (A) (by C-G formula based on SCr of 1.55 mg/dL (H)). Liver Function Tests: Recent Labs  Lab 11/24/2020 0517 11/16/20 0424  AST 698* 474*  ALT 169* 128*  ALKPHOS 543* 443*  BILITOT 3.6* 3.6*  PROT 6.1* 5.6*  ALBUMIN 2.3* 1.9*   No results for input(s): LIPASE, AMYLASE in the last 168 hours. Recent Labs  Lab 11/13/2020 0517  AMMONIA 45*   Coagulation Profile: Recent  Labs  Lab 11/16/20 0853  INR 3.6*   Cardiac Enzymes: No results for input(s): CKTOTAL, CKMB, CKMBINDEX, TROPONINI in the last 168 hours. BNP (last 3 results) No results for input(s): PROBNP in the last 8760 hours. HbA1C: No results for input(s): HGBA1C in the last 72 hours. CBG: Recent Labs  Lab 11/25/2020 1315  GLUCAP 140*   Lipid Profile: No results for input(s): CHOL, HDL, LDLCALC, TRIG, CHOLHDL, LDLDIRECT in the last 72 hours. Thyroid Function Tests: No results for input(s): TSH, T4TOTAL, FREET4, T3FREE, THYROIDAB in the last 72 hours. Anemia Panel: No results for input(s): VITAMINB12, FOLATE, FERRITIN, TIBC, IRON, RETICCTPCT in the last 72 hours. Sepsis Labs: No results for input(s): PROCALCITON, LATICACIDVEN in the last 168 hours.  Recent Results (from the past 240 hour(s))  Culture, Urine     Status: None   Collection Time: 11/21/2020 11:31 AM   Specimen: Urine, Clean Catch  Result Value Ref Range Status   Specimen Description   Final    URINE, CLEAN CATCH Performed at [facility], [address], [place], [place] [address]    Special Requests   Final    NONE Performed at [facility], [address], [place], [place] [address]    Culture   Final    NO GROWTH Performed at [facility], [place] [address] [place], [place] [address]    Report Status 11/17/2020 FINAL  Final         Radiology Studies: No results found.      Scheduled Meds:  feeding supplement  237 mL Oral BID BM   Continuous Infusions:   LOS: 4 days    Time spent: 30 minutes    Barb Merino, MD [facility] Pager [phone]

## 2020-12-08 NOTE — Progress Notes (Signed)
Patient expired at 1403 witnessed by Horton Marshall and [place]. Dr. Sloan Leiter was notified. [facility] was notified and cleared with referal number [phone]. Patient was picked up by Henrene Pastor and [facility] at 1730 from room per [facility].

## 2020-12-08 NOTE — TOC Progression Note (Signed)
Transition of Care [facility]) - Progression Note    Patient Details  Name: Melissa Reynolds MRN: [id number] Date of Birth: Feb 12, 1941  [facility] of [facility]) CM/SW Contact  Kemora Pinard, Marjie Skiff, RN Phone Number: 2020/12/16, 12:05 PM  Clinical Narrative:     Left voicemail for Cassandra at [facility] to inquire about residential bed availability. TOC will await response.  Expected Discharge Plan: [place] Barriers to Discharge: Continued Medical Work up  Expected Discharge Plan and Services Expected Discharge Plan: [place] In-house Referral: Clinical Social Work     Living arrangements for the past 2 months: Single Family Home                 DME Arranged: N/A DME Agency: NA       Readmission Risk Interventions Readmission Risk Prevention Plan 11/18/2020  Transportation Screening Complete  Medication Review Press photographer) Complete  PCP or Specialist appointment within 3-5 days of discharge Complete  HRI or [place] Complete  SW Recovery Care/Counseling Consult Complete  [place] Not Applicable  Some recent data might be hidden

## 2020-12-08 NOTE — Progress Notes (Signed)
Provided grief support to Xin's sister, Charleston Ropes, as well as two friends of the family.  Daisy's friend requested prayer for Charleston Ropes, as the only sibling who is still alive.    I provided prayer and ministry of presence.  [place], Bcc Pager, [phone] 4:30 PM

## 2020-12-08 NOTE — Death Summary Note (Signed)
DEATH SUMMARY   Patient Details  Name: Melissa Reynolds MRN: [id number] DOB: 12/07/1940  Admission/Discharge Information   Admit Date:  11/24/2020  Date of Death: Date of Death: Nov 28, 2020  Time of Death: Time of Death: August 04, 1401  Length of Stay: 4  Referring Physician: [name], MD   Reason(s) for Hospitalization  Weakness, fall  Diagnoses  Preliminary cause of death:  Secondary Diagnoses (including complications and co-morbidities):  Principal Problem:   End of life care Active Problems:   RA (rheumatoid arthritis) ([place])   Osteoarthritis   Coronary artery disease of native artery of native heart with stable angina pectoris ([place])   Essential hypertension   DMII (diabetes mellitus, type 2) ([place])   Pulmonary embolus ([place])   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)   Gastroesophageal reflux disease   Liver masses   Hyperkalemia   AKI (acute kidney injury) ([place])   Elevated liver enzymes   Stage III carcinoma of colon [facility])   [facility] Course (including significant findings, care, treatment, and services provided and events leading to death)  Melissa Reynolds is a 80 y.o. year old female who has history of metastatic colon cancer, recent pulmonary embolism and DVT, seizure disorder, type 2 diabetes and hypertension who presented to the emergency room with weakness, generalized malaise and fall at home.  Patient was recently diagnosed with metastatic colon cancer with mets to lungs.  New CT scan consistent with metastasis to liver.  On admission, she was found to be with advanced cancer, debility, failure to thrive.  With all advanced disease, as per oncology recommendation palliation and hospice was advised and end-of-life care was started in the hospital and inpatient hospice referral was done.  Patient was receiving end-of-life care while in the hospital and she died peacefully under hospice level of care.  Metastatic colon cancer with metastasis to  lungs and liver PE and DVT Adult failure to thrive    Pertinent Labs and Studies  Significant Diagnostic Studies CT Head Wo Contrast  Result Date: Nov 24, 2020 CLINICAL DATA:  Weakness, found down. EXAM: CT HEAD WITHOUT CONTRAST TECHNIQUE: Contiguous axial images were obtained from the base of the skull through the vertex without intravenous contrast. COMPARISON:  Head CT dated 12/18/2015. FINDINGS: Brain: Ventricles are stable in size and configuration. Mild chronic small vessel ischemic changes within the deep periventricular white matter regions bilaterally. No mass, hemorrhage, edema or other evidence of acute parenchymal abnormality. No extra-axial hemorrhage. Vascular: Chronic calcified atherosclerotic changes of the large vessels at the skull base. No unexpected hyperdense vessel. Skull: Normal. Negative for fracture or focal lesion. Sinuses/Orbits: Sinuses are clear. Surgical changes of RIGHT mastoidectomy. Periorbital and retro-orbital soft tissues are unremarkable. Other: None. IMPRESSION: 1. No acute findings. No intracranial mass, hemorrhage or edema. 2. Mild chronic small vessel ischemic changes in the white matter. Electronically Signed   By: Franki Cabot M.D.   On: 2020/11/24 07:56   CT Angio Chest PE W and/or Wo Contrast  Result Date: 10/29/2020 CLINICAL DATA:  Weakness and loss of appetite. Evaluate for pulmonary embolus. EXAM: CT ANGIOGRAPHY CHEST WITH CONTRAST TECHNIQUE: Multidetector CT imaging of the chest was performed using the standard protocol during bolus administration of intravenous contrast. Multiplanar CT image reconstructions and MIPs were obtained to evaluate the vascular anatomy. CONTRAST:  180mL OMNIPAQUE IOHEXOL 350 MG/ML SOLN COMPARISON:  10/10/2020. FINDINGS: Cardiovascular: Segmental and subsegmental filling defects in the right lower lobe pulmonary arteries, increased from 10/10/2020. Atherosclerotic calcification of the  aorta. Pulmonic trunk and heart are enlarged.  No pericardial effusion. Mediastinum/Nodes: Surgical clips in the region of the right thyroid. No pathologically enlarged mediastinal, hilar or axillary lymph nodes. Esophagus is grossly unremarkable. Lungs/Pleura: A few scattered pulmonary nodules measure up to 7 mm in the right upper lobe (6/54), similar to 10/10/2020. Scattered volume loss in the lung bases. No pleural fluid. Airway is unremarkable. Upper Abdomen: Liver margin is irregular. Visualized portions of the liver, adrenal glands, left kidney, spleen, pancreas, stomach and bowel are otherwise unremarkable. Small ascites in the left upper quadrant. Musculoskeletal: Degenerative changes in the spine. Review of the MIP images confirms the above findings. IMPRESSION: 1. Segmental/subsegmental pulmonary emboli in the right lower lobe, increased from 10/10/2020. Critical Value/emergent results were called by telephone at the time of interpretation on 10/29/2020 at 12:12 pm to provider JOSEPH ZAMMIT , who verbally acknowledged these results. 2. Scattered pulmonary nodules measure up to 7 mm in the right upper lobe. Non-contrast chest CT at 3-6 months is recommended. If the nodules are stable at time of repeat CT, then future CT at 18-24 months (from today's scan) is considered optional for low-risk patients, but is recommended for high-risk patients. This recommendation follows the consensus statement: Guidelines for Management of Incidental Pulmonary Nodules Detected on CT Images: From the Fleischner Society 2017; Radiology 2017; 284:228-243. 3. Cirrhosis. 4.  Aortic atherosclerosis (ICD10-I70.0). 5. Enlarged pulmonic trunk, indicative of pulmonary arterial hypertension. Electronically Signed   By: Lorin Picket M.D.   On: 10/29/2020 12:12   US Abdomen Complete  Result Date: 10/29/2020 CLINICAL DATA:  Elevated bilirubin, history of prior cholecystectomy EXAM: ABDOMEN ULTRASOUND COMPLETE COMPARISON:  CTA of the chest from 10/29/2020 FINDINGS: Gallbladder:  Surgically removed. Common bile duct: Diameter: 4.3 mm. Liver: Mild heterogeneity of the liver is noted without focal mass consistent with the cirrhotic change seen on recent CT examination. Portal vein is patent on color Doppler imaging with normal direction of blood flow towards the liver. IVC: No abnormality visualized. Pancreas: Visualized portion unremarkable. Spleen: Size and appearance within normal limits. Right Kidney: Length: 10.5 cm. Echogenicity within normal limits. No mass or hydronephrosis visualized. Left Kidney: Length: 11.6 cm. Echogenicity within normal limits. No mass or hydronephrosis visualized. Abdominal aorta: No aneurysm visualized. Other findings: None. IMPRESSION: Cirrhotic change of the liver without focal mass. Status post cholecystectomy. No other focal abnormality is noted. Electronically Signed   By: Inez Catalina M.D.   On: 10/29/2020 17:14   CT ABDOMEN PELVIS W CONTRAST  Result Date: 11/07/2020 CLINICAL DATA:  Found down, weakness. EXAM: CT ABDOMEN AND PELVIS WITH CONTRAST TECHNIQUE: Multidetector CT imaging of the abdomen and pelvis was performed using the standard protocol following bolus administration of intravenous contrast. CONTRAST:  40mL OMNIPAQUE IOHEXOL 300 MG/ML  SOLN COMPARISON:  CT abdomen dated 03/17/2018. FINDINGS: Lower chest: No acute abnormality. Hepatobiliary: Liver is diffusely infiltrated with masses, largest/most confluent mass within the RIGHT liver lobe measuring approximately 14 x 13 cm. Status post cholecystectomy. No bile duct dilatation seen. Pancreas: Unremarkable. No pancreatic ductal dilatation or surrounding inflammatory changes. Spleen: Normal in size without focal abnormality. Adrenals/Urinary Tract: Adrenal glands are unremarkable. Wedge-shaped hypodensity within the LEFT renal cortex, suggesting edema, infarct versus pyelonephritis. Small hypodense lesion within the lower pole of the RIGHT kidney is too small to characterize but most likely a  small cyst. No hydronephrosis bilaterally. No ureteral or bladder calculi identified. Bladder is unremarkable. Stomach/Bowel: No dilated large or small bowel loops. Surgical anastomosis at the level  of the LEFT splenic flexure, presumably related to a previous partial colon resection. No obstruction or other complicating feature in this area. Stomach is unremarkable, partially decompressed.w Vascular/Lymphatic: Aortic atherosclerosis. No acute-appearing vascular abnormality. No enlarged lymph nodes are seen within the abdomen or pelvis. Reproductive: Presumed hysterectomy.  No adnexal mass or free fluid. Other: Small amount of free fluid underlying the RIGHT liver lobe, likely related to the overlying hepatic mass. Musculoskeletal: No acute or suspicious osseous abnormality. IMPRESSION: 1. Liver is diffusely infiltrated with masses, largest/most confluent mass within the RIGHT liver lobe measuring approximately 14 x 13 cm. Findings are highly suspicious for metastatic disease. 2. Wedge-shaped hypodensity within the LEFT renal cortex, suggesting edema, most likely focal infarct or pyelonephritis. Recommend correlation with urinalysis. 3. Surgical anastomosis at the level of the LEFT splenic flexure, presumably related to a previous partial colon resection. No obstruction or other complicating feature in this area. 4. No bowel obstruction.  No free intraperitoneal air. Aortic Atherosclerosis (ICD10-I70.0). Electronically Signed   By: Franki Cabot M.D.   On: 11/16/2020 08:08   DG Chest Portable 1 View  Result Date: 11/12/2020 CLINICAL DATA:  Weakness EXAM: PORTABLE CHEST 1 VIEW COMPARISON:  09/25/2019 FINDINGS: Postoperative thoracic inlet. Low volume chest with band of density at the right base favoring atelectasis. There is no edema, air bronchogram, effusion, or pneumothorax. Normal heart size and mediastinal contours IMPRESSION: Low volume chest with right base opacity favoring atelectasis. Electronically  Signed   By: Monte Fantasia M.D.   On: 11/08/2020 05:53   ECHOCARDIOGRAM COMPLETE  Result Date: 10/29/2020    ECHOCARDIOGRAM REPORT   Patient Name:   Melissa Reynolds Date of Exam: 10/29/2020 Medical Rec #:  [id number]         Height:       65.0 in Accession #:    [id number]        Weight:       194.0 lb Date of Birth:  1940-12-24          BSA:          1.953 m Patient Age:    58 years          BP:           141/70 mmHg Patient Gender: F                 HR:           90 bpm. Exam Location:  [facility] Procedure: 2D Echo, Cardiac Doppler and Color Doppler Indications:    Pulmonary Embolus I26.09  History:        Patient has prior history of Echocardiogram examinations, most                 recent 02/12/2014. CAD, Arrythmias:non-specific ST changes; Risk                 Factors:Diabetes and Hypertension.  Sonographer:    Wenda Low Referring Phys: [place]  1. Left ventricular ejection fraction, by estimation, is 65 to 70%. The left ventricle has normal function. The left ventricle has no regional wall motion abnormalities. Left ventricular diastolic parameters are consistent with Grade I diastolic dysfunction (impaired relaxation).  2. Right ventricular systolic function is normal. The right ventricular size is normal. There is normal pulmonary artery systolic pressure.  3. The mitral valve is normal in structure. No evidence of mitral valve regurgitation. No evidence of mitral stenosis.  4. The aortic valve is tricuspid.  Aortic valve regurgitation is not visualized. No aortic stenosis is present.  5. The inferior vena cava is normal in size with greater than 50% respiratory variability, suggesting right atrial pressure of 3 mmHg. FINDINGS  Left Ventricle: Left ventricular ejection fraction, by estimation, is 65 to 70%. The left ventricle has normal function. The left ventricle has no regional wall motion abnormalities. The left ventricular internal cavity size was normal in size. There  is  no left ventricular hypertrophy. Left ventricular diastolic parameters are consistent with Grade I diastolic dysfunction (impaired relaxation). Right Ventricle: The right ventricular size is normal. No increase in right ventricular wall thickness. Right ventricular systolic function is normal. There is normal pulmonary artery systolic pressure. The tricuspid regurgitant velocity is 2.81 m/s, and  with an assumed right atrial pressure of 3 mmHg, the estimated right ventricular systolic pressure is 91.4 mmHg. Left Atrium: Left atrial size was normal in size. Right Atrium: Right atrial size was normal in size. Pericardium: There is no evidence of pericardial effusion. Mitral Valve: The mitral valve is normal in structure. No evidence of mitral valve regurgitation. No evidence of mitral valve stenosis. MV peak gradient, 3.6 mmHg. The mean mitral valve gradient is 1.0 mmHg. Tricuspid Valve: The tricuspid valve is normal in structure. Tricuspid valve regurgitation is mild . No evidence of tricuspid stenosis. Aortic Valve: The aortic valve is tricuspid. Aortic valve regurgitation is not visualized. No aortic stenosis is present. Aortic valve mean gradient measures 3.0 mmHg. Aortic valve peak gradient measures 5.1 mmHg. Aortic valve area, by VTI measures 3.38 cm. Pulmonic Valve: The pulmonic valve was normal in structure. Pulmonic valve regurgitation is not visualized. No evidence of pulmonic stenosis. Aorta: The aortic root is normal in size and structure. Venous: The inferior vena cava is normal in size with greater than 50% respiratory variability, suggesting right atrial pressure of 3 mmHg. IAS/Shunts: No atrial level shunt detected by color flow Doppler.  LEFT VENTRICLE PLAX 2D LVIDd:         4.30 cm  Diastology LVIDs:         2.89 cm  LV e' medial:    7.10 cm/s LV PW:         1.17 cm  LV E/e' medial:  7.6 LV IVS:        0.91 cm  LV e' lateral:   9.11 cm/s LVOT diam:     2.10 cm  LV E/e' lateral: 5.9 LV SV:          67 LV SV Index:   34 LVOT Area:     3.46 cm  RIGHT VENTRICLE RV S prime:     19.60 cm/s LEFT ATRIUM             Index       RIGHT ATRIUM           Index LA diam:        3.10 cm 1.59 cm/m  RA Area:     10.40 cm LA Vol (A2C):   48.2 ml 24.68 ml/m RA Volume:   17.20 ml  8.81 ml/m LA Vol (A4C):   27.2 ml 13.93 ml/m LA Biplane Vol: 36.8 ml 18.85 ml/m  AORTIC VALVE AV Area (Vmax):    2.89 cm AV Area (Vmean):   3.18 cm AV Area (VTI):     3.38 cm AV Vmax:           113.00 cm/s AV Vmean:          78.000  cm/s AV VTI:            0.199 m AV Peak Grad:      5.1 mmHg AV Mean Grad:      3.0 mmHg LVOT Vmax:         94.20 cm/s LVOT Vmean:        71.700 cm/s LVOT VTI:          0.194 m LVOT/AV VTI ratio: 0.97  AORTA Ao Root diam: 3.20 cm Ao Asc diam:  3.50 cm MITRAL VALVE               TRICUSPID VALVE MV Area (PHT): 2.36 cm    TR Peak grad:   31.6 mmHg MV Area VTI:   3.71 cm    TR Vmax:        281.00 cm/s MV Peak grad:  3.6 mmHg MV Mean grad:  1.0 mmHg    SHUNTS MV Vmax:       0.95 m/s    Systemic VTI:  0.19 m MV Vmean:      46.0 cm/s   Systemic Diam: 2.10 cm MV Decel Time: 321 msec MV E velocity: 53.90 cm/s MV A velocity: 99.80 cm/s MV E/A ratio:  0.54 Jenkins Rouge MD Electronically signed by Jenkins Rouge MD Signature Date/Time: 10/29/2020/4:46:14 PM    Final    [place] ELASTOGRAPHY LIVER  Result Date: 10/30/2020 CLINICAL DATA:  Early cirrhosis EXAM: US LIVER ELASTOGRAPHY TECHNIQUE: Sonography of the liver was performed. In addition, ultrasound elastography evaluation of the liver was performed. A region of interest was placed within the right lobe of the liver. Following application of a compressive sonographic pulse, tissue compressibility was assessed. Multiple assessments were performed at the selected site. Median tissue compressibility was determined. Previously, hepatic stiffness was assessed by shear wave velocity. Based on recently published Society of Radiologists in Ultrasound consensus article, reporting is now  recommended to be performed in the SI units of pressure (kiloPascals) representing hepatic stiffness/elasticity. The obtained result is compared to the published reference standards. (cACLD = compensated Advanced Chronic Liver Disease) COMPARISON:  None. FINDINGS: Liver: No focal lesion identified. Heterogeneous common nodular appearance of the liver with increased parenchymal echogenicity. Portal vein is patent on color Doppler imaging with normal direction of blood flow towards the liver. ULTRASOUND HEPATIC ELASTOGRAPHY Device: Siemens Helix VTQ Patient position: Supine Transducer: [id number] Number of measurements: 15 Hepatic segment:  8 Median kPa: 9.8 IQR: 1.6 IQR/Median kPa ratio: 0.2 Data quality:  Good Diagnostic category: >9 kPa and ?13 kPa: suggestive of cACLD, but needs further testing The use of hepatic elastography is applicable to patients with viral hepatitis and non-alcoholic fatty liver disease. At this time, there is insufficient data for the referenced cut-off values and use in other causes of liver disease, including alcoholic liver disease. Patients, however, may be assessed by elastography and serve as their own reference standard/baseline. In patients with non-alcoholic liver disease, the values suggesting compensated advanced chronic liver disease (cACLD) may be lower, and patients may need additional testing with elasticity results of 7-9 kPa. Please note that abnormal hepatic elasticity and shear wave velocities may also be identified in clinical settings other than with hepatic fibrosis, such as: acute hepatitis, elevated right heart and central venous pressures including use of beta blockers, veno-occlusive disease (Budd-Chiari), infiltrative processes such as mastocytosis/amyloidosis/infiltrative tumor/lymphoma, extrahepatic cholestasis, with hyperemia in the post-prandial state, and with liver transplantation. Correlation with patient history, laboratory data, and clinical condition  recommended. Diagnostic Categories: < or =  5 kPa: high probability of being normal < or =9 kPa: in the absence of other known clinical signs, rules out cACLD >9 kPa and ?13 kPa: suggestive of cACLD, but needs further testing >13 kPa: highly suggestive of cACLD > or =17 kPa: highly suggestive of cACLD with an increased probability of clinically significant portal hypertension IMPRESSION: ULTRASOUND LIVER: 1.  Coarse, nodular hepatic echotexture suggesting cirrhosis. 2.  Hepatic steatosis. ULTRASOUND HEPATIC ELASTOGRAPHY: Median kPa:  9.8 Diagnostic category: >9 kPa and ?13 kPa: suggestive of cACLD, but needs further testing Electronically Signed   By: Eddie Candle M.D.   On: 10/30/2020 13:58    Microbiology Recent Results (from the past 240 hour(s))  Culture, Urine     Status: None   Collection Time: 12/04/2020 11:31 AM   Specimen: Urine, Clean Catch  Result Value Ref Range Status   Specimen Description   Final    URINE, CLEAN CATCH Performed at [facility], [address], [place], [place] [address]    Special Requests   Final    NONE Performed at [facility], [address] [place], [place] [address]    Culture   Final    NO GROWTH Performed at [facility], [place] [address], [place], [place] [address]    Report Status 11/17/2020 FINAL  Final    Lab Basic Metabolic Panel: Recent Labs  Lab 12/04/2020 0517 11/16/20 0424  NA 132* 138  K 5.9* 5.5*  CL 101 108  CO2 16* 18*  GLUCOSE 197* 112*  BUN 45* 48*  CREATININE 1.61* 1.55*  CALCIUM 7.9* 7.4*  MG 1.9 1.8   Liver Function Tests: Recent Labs  Lab 11/16/2020 0517 11/16/20 0424  AST 698* 474*  ALT 169* 128*  ALKPHOS 543* 443*  BILITOT 3.6* 3.6*  PROT 6.1* 5.6*  ALBUMIN 2.3* 1.9*   No results for input(s): LIPASE, AMYLASE in the last 168 hours. Recent Labs  Lab 11/14/2020 0517  AMMONIA 45*   CBC: Recent Labs  Lab 12/06/2020 0517 11/16/20 0424  WBC 14.6* 15.1*  NEUTROABS 10.6*  --   HGB 11.5* 10.8*  HCT 32.2*  29.9*  MCV 76.3* 73.8*  PLT 194 176   Cardiac Enzymes: No results for input(s): CKTOTAL, CKMB, CKMBINDEX, TROPONINI in the last 168 hours. Sepsis Labs: Recent Labs  Lab 12/05/2020 0517 11/16/20 0424  WBC 14.6* 15.1*    Procedures/Operations     Else Habermann 12-16-2020, 4:22 PM

## 2020-12-08 DEATH — deceased

## 2022-03-14 IMAGING — DX DG CHEST 1V PORT
1 series · 1 of 1 positions shown · non-contrast
Comparison: 09/25/2019

CLINICAL DATA: Weakness

EXAM:
PORTABLE CHEST 1 VIEW

[chest ap]
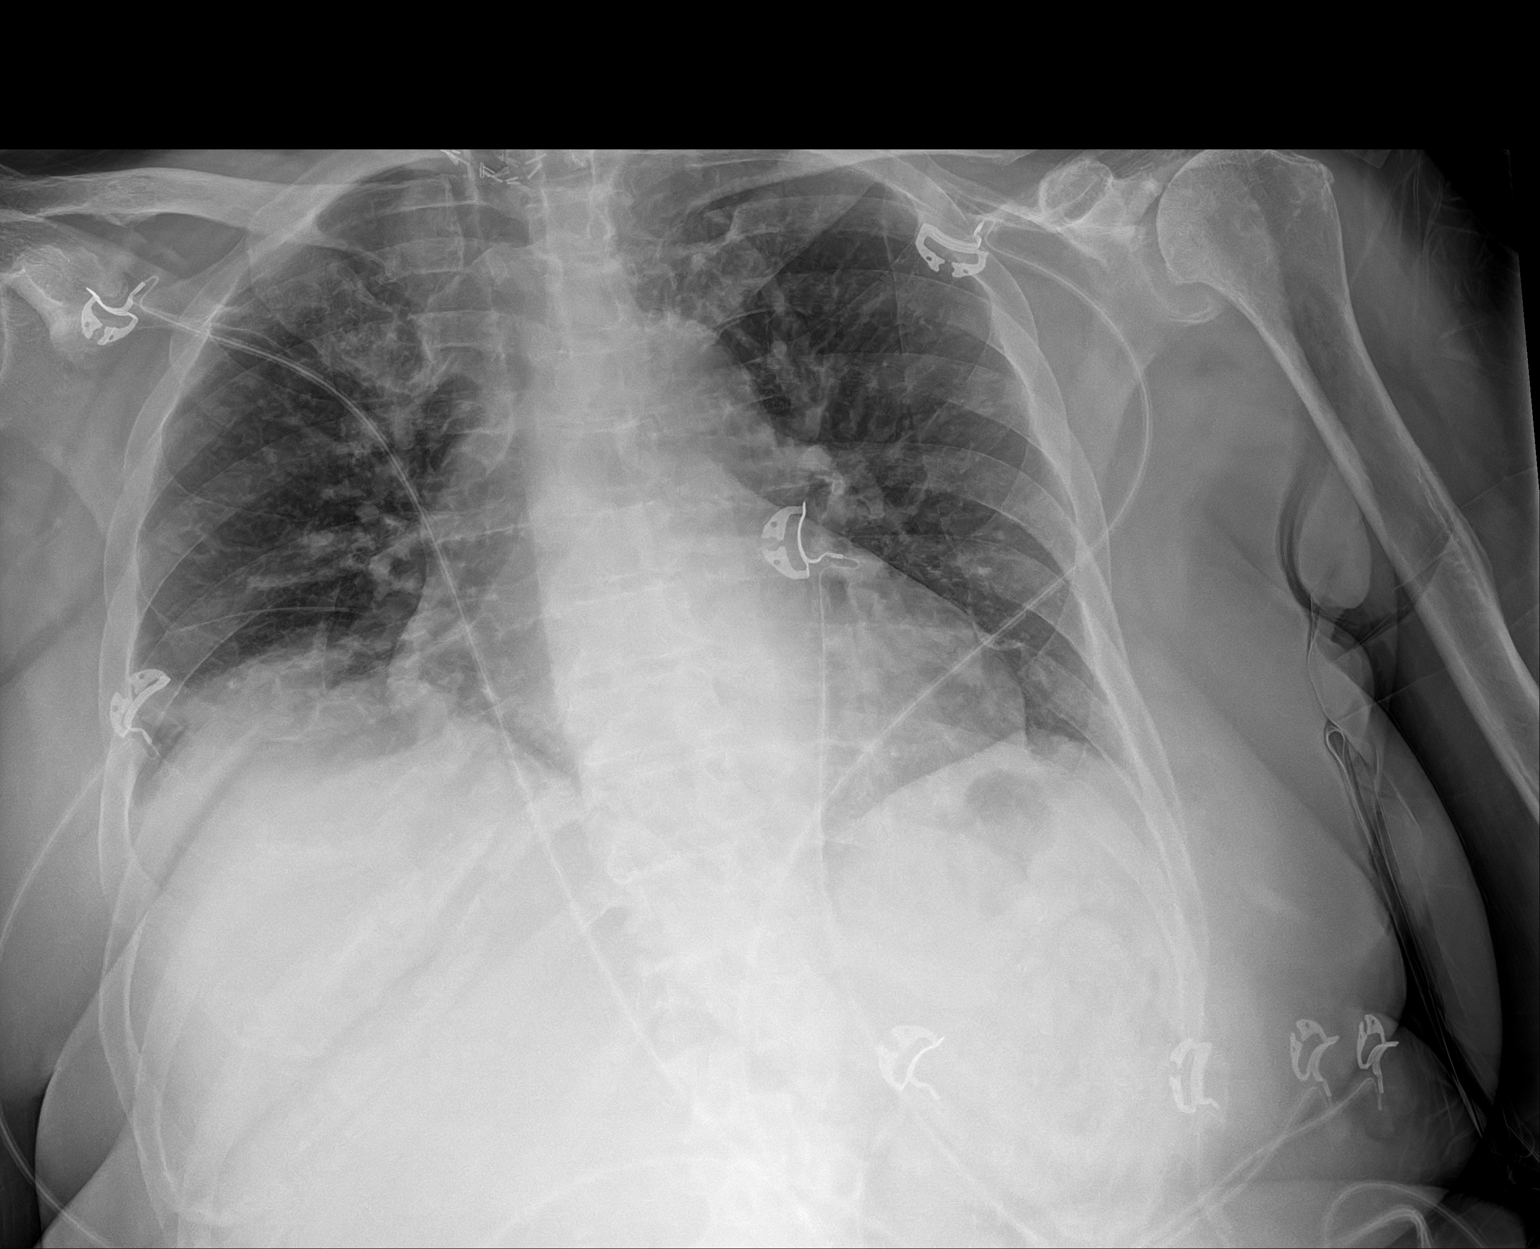

[1 of 1 positions shown; findings below may reference images not displayed]

FINDINGS: Postoperative thoracic inlet. Low volume chest with band of density
at the right base favoring atelectasis. There is no edema, air
bronchogram, effusion, or pneumothorax. Normal heart size and
mediastinal contours
IMPRESSION: Low volume chest with right base opacity favoring atelectasis.

## 2022-03-14 IMAGING — CT CT HEAD W/O CM
3 series · 16 of 47 positions shown, 19 images · non-contrast
Comparison: Head CT dated 12/18/2015.

CLINICAL DATA: Weakness, found down.

EXAM:
CT HEAD WITHOUT CONTRAST
TECHNIQUE: Contiguous axial images were obtained from the base of the skull
through the vertex without intravenous contrast.

[Series 2: head w o · axial · 0.45mm/px · z∈[-672,-537]mm · 10 of 33 slices shown, 13 images]
[im 3/33  brain]
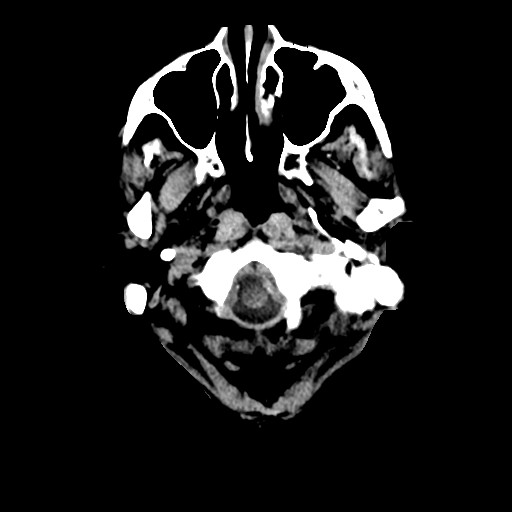
[im 3/33  bone]
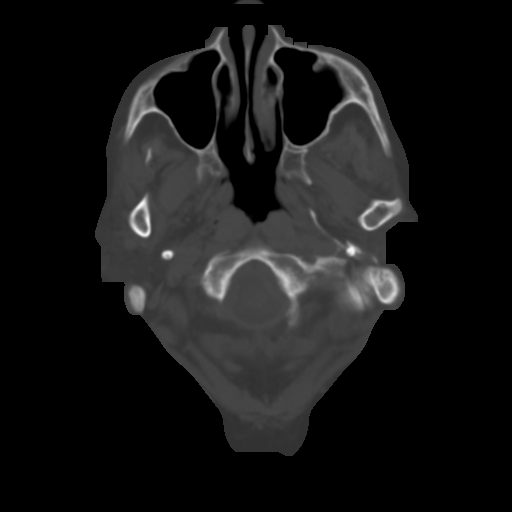
[im 6/33  brain]
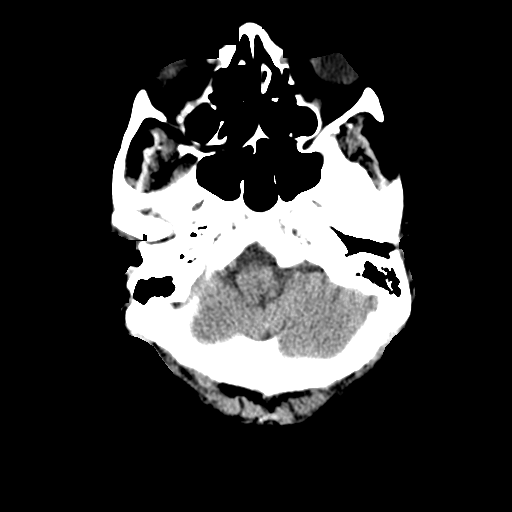
[im 9/33  brain]
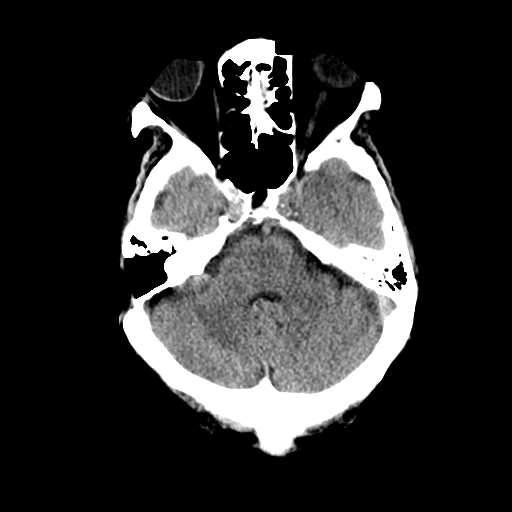
[im 12/33  brain]
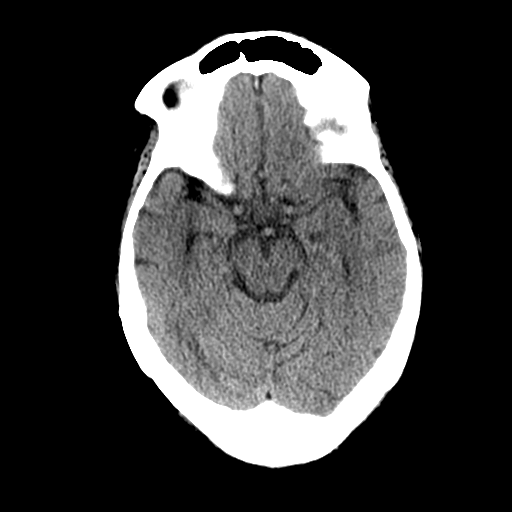
[im 15/33  brain]
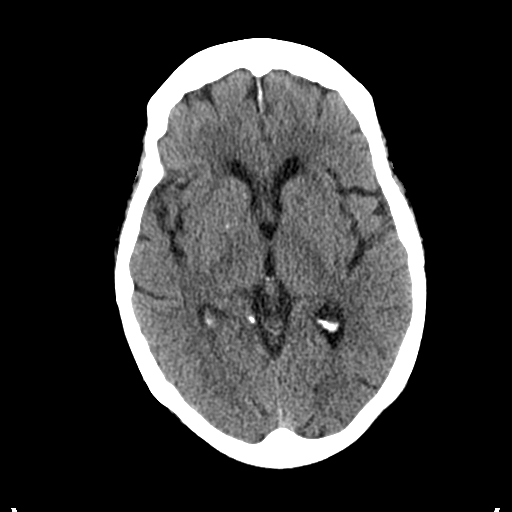
[im 15/33  bone]
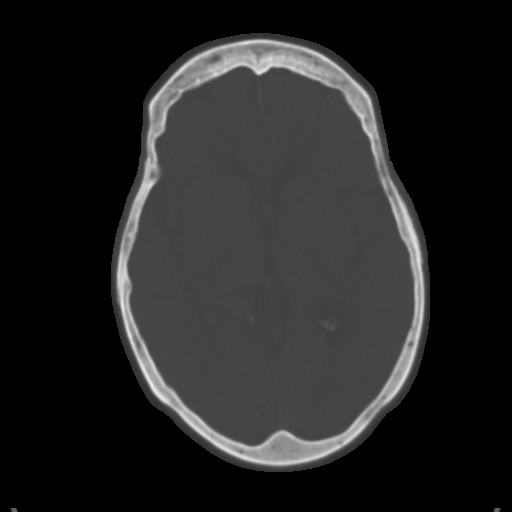
[im 18/33  brain]
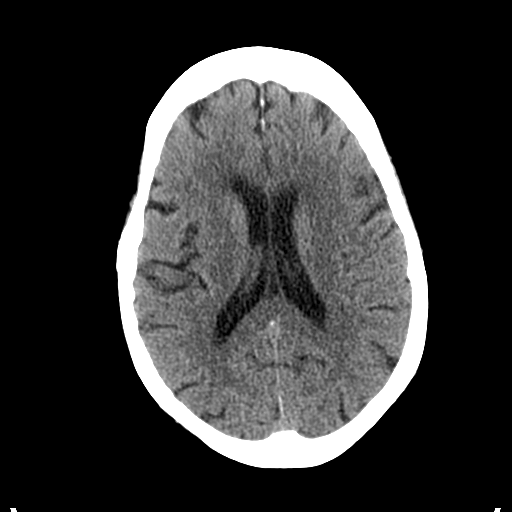
[im 21/33  brain]
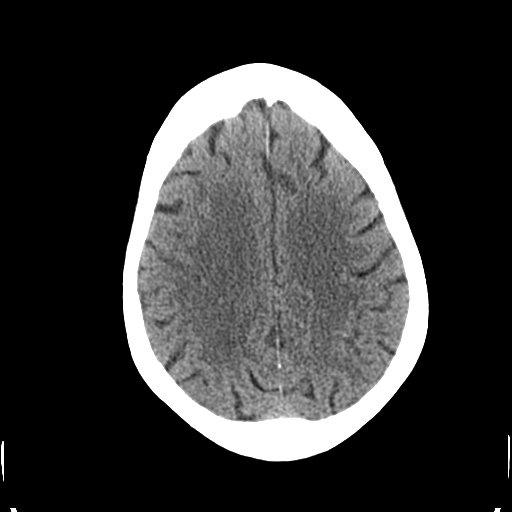
[im 25/33  brain]
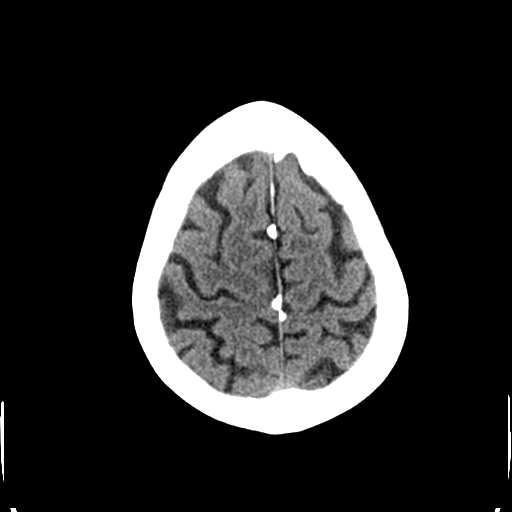
[im 27/33  brain]
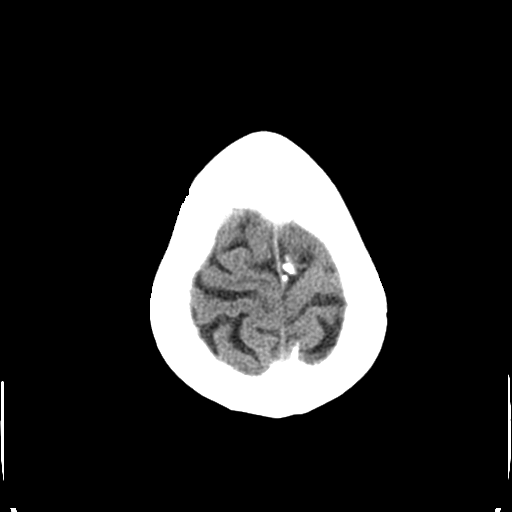
[im 27/33  bone]
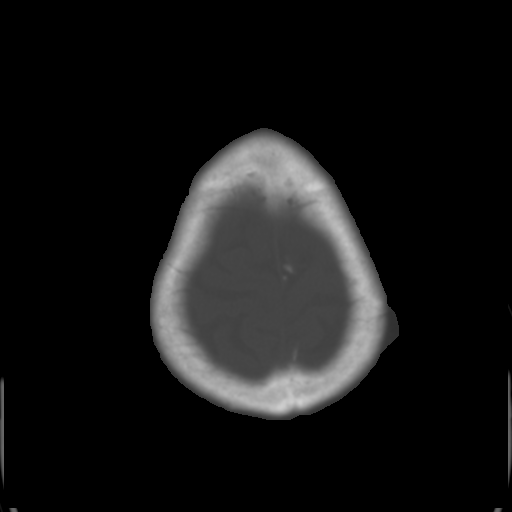
[im 30/33  brain]
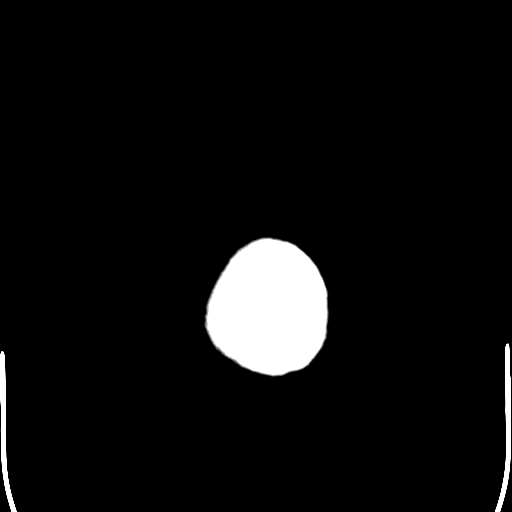

[Series 4: coronal soft · coronal · 0.35mm/px · 3 of 76 slices shown]
[im 26/76  brain]
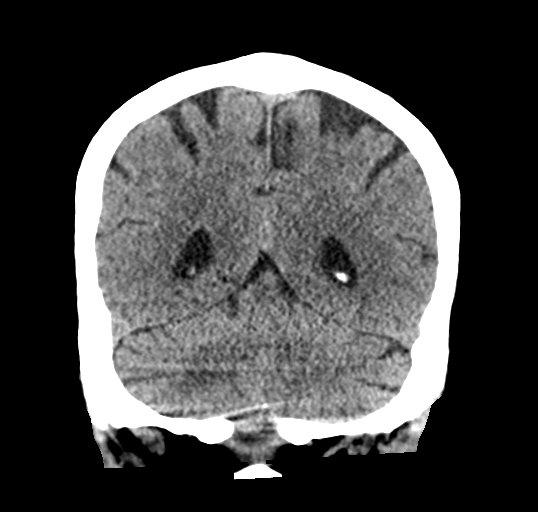
[im 34/76  brain]
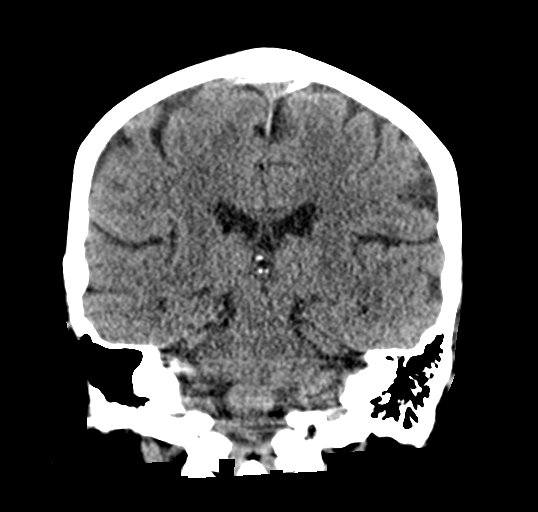
[im 42/76  brain]
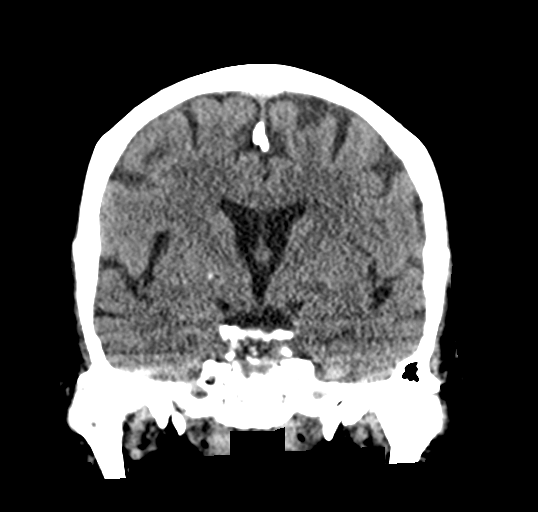

[Series 5: sagittal soft · sagittal · 0.35mm/px · 3 of 58 slices shown]
[im 20/58  brain]
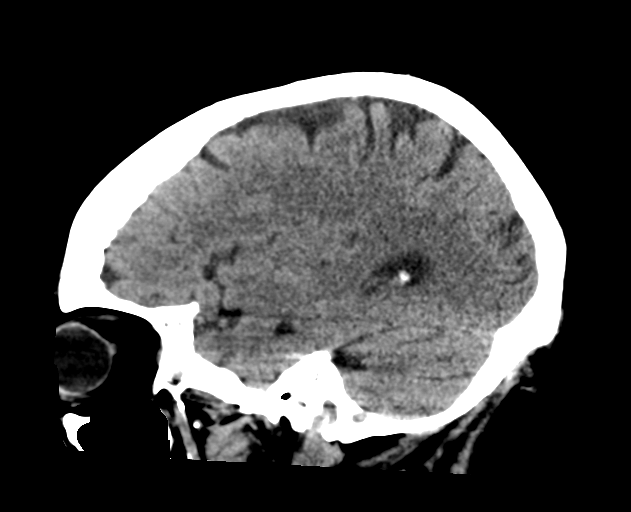
[im 29/58  brain]
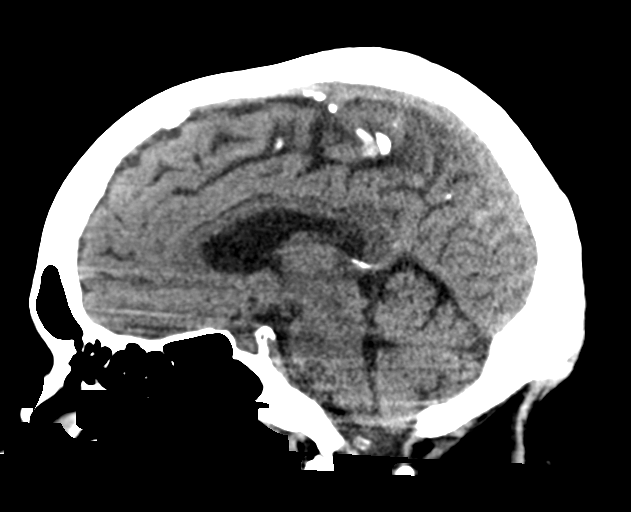
[im 39/58  brain]
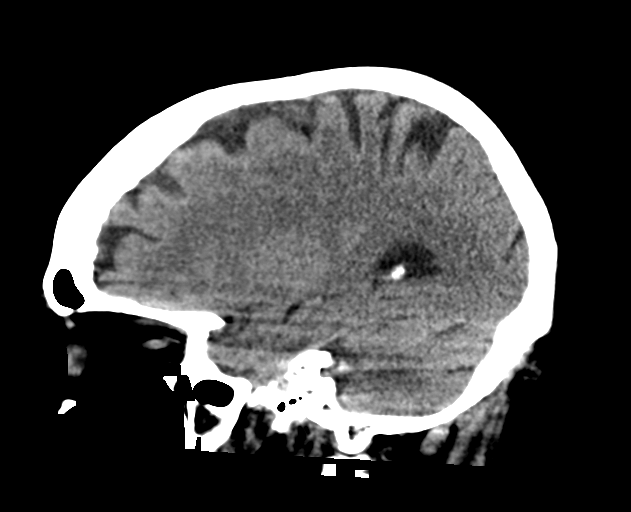

[16 of 47 positions shown; findings below may reference images not displayed]

FINDINGS: Brain: Ventricles are stable in size and configuration. Mild chronic
small vessel ischemic changes within the deep periventricular white
matter regions bilaterally.

No mass, hemorrhage, edema or other evidence of acute parenchymal
abnormality. No extra-axial hemorrhage.

Vascular: Chronic calcified atherosclerotic changes of the large
vessels at the skull base. No unexpected hyperdense vessel.

Skull: Normal. Negative for fracture or focal lesion.

Sinuses/Orbits: Sinuses are clear. Surgical changes of RIGHT
mastoidectomy. Periorbital and retro-orbital soft tissues are
unremarkable.

Other: None.
IMPRESSION: 1. No acute findings. No intracranial mass, hemorrhage or edema.
2. Mild chronic small vessel ischemic changes in the white matter.
